# Patient Record
Sex: Female | Born: 1960 | Race: White | Hispanic: No | Marital: Married | Smoking: Never smoker
Health system: Southern US, Community
[De-identification: ages and names within clinical notes are randomized; demographics above are authoritative.]

## PROBLEM LIST (undated history)

## (undated) DIAGNOSIS — G4730 Sleep apnea, unspecified: Secondary | ICD-10-CM

## (undated) DIAGNOSIS — M7989 Other specified soft tissue disorders: Secondary | ICD-10-CM

## (undated) DIAGNOSIS — F410 Panic disorder [episodic paroxysmal anxiety] without agoraphobia: Secondary | ICD-10-CM

## (undated) DIAGNOSIS — E041 Nontoxic single thyroid nodule: Secondary | ICD-10-CM

## (undated) DIAGNOSIS — M2550 Pain in unspecified joint: Secondary | ICD-10-CM

## (undated) DIAGNOSIS — F32A Depression, unspecified: Secondary | ICD-10-CM

## (undated) DIAGNOSIS — E559 Vitamin D deficiency, unspecified: Secondary | ICD-10-CM

## (undated) DIAGNOSIS — F419 Anxiety disorder, unspecified: Secondary | ICD-10-CM

## (undated) DIAGNOSIS — R0602 Shortness of breath: Secondary | ICD-10-CM

## (undated) DIAGNOSIS — M549 Dorsalgia, unspecified: Secondary | ICD-10-CM

## (undated) DIAGNOSIS — J189 Pneumonia, unspecified organism: Secondary | ICD-10-CM

## (undated) DIAGNOSIS — K589 Irritable bowel syndrome without diarrhea: Secondary | ICD-10-CM

## (undated) DIAGNOSIS — R911 Solitary pulmonary nodule: Secondary | ICD-10-CM

## (undated) HISTORY — DX: Shortness of breath: R06.02

## (undated) HISTORY — DX: Panic disorder (episodic paroxysmal anxiety): F41.0

## (undated) HISTORY — DX: Vitamin D deficiency, unspecified: E55.9

## (undated) HISTORY — DX: Sleep apnea, unspecified: G47.30

## (undated) HISTORY — DX: Dorsalgia, unspecified: M54.9

## (undated) HISTORY — PX: OTHER SURGICAL HISTORY: SHX169

## (undated) HISTORY — DX: Solitary pulmonary nodule: R91.1

## (undated) HISTORY — DX: Depression, unspecified: F32.A

## (undated) HISTORY — DX: Anxiety disorder, unspecified: F41.9

## (undated) HISTORY — DX: Nontoxic single thyroid nodule: E04.1

## (undated) HISTORY — DX: Irritable bowel syndrome, unspecified: K58.9

## (undated) HISTORY — DX: Other specified soft tissue disorders: M79.89

## (undated) HISTORY — DX: Pain in unspecified joint: M25.50

---

## 1997-12-23 ENCOUNTER — Ambulatory Visit: Admission: RE | Admit: 1997-12-23 | Discharge: 1997-12-23 | Payer: Self-pay | Admitting: Pulmonary Disease

## 1998-10-26 ENCOUNTER — Other Ambulatory Visit: Admission: RE | Admit: 1998-10-26 | Discharge: 1998-10-26 | Payer: Self-pay | Admitting: *Deleted

## 1999-01-20 ENCOUNTER — Encounter: Admission: RE | Admit: 1999-01-20 | Discharge: 1999-01-20 | Payer: Self-pay | Admitting: *Deleted

## 1999-01-20 ENCOUNTER — Encounter: Payer: Self-pay | Admitting: Obstetrics and Gynecology

## 1999-03-18 ENCOUNTER — Encounter: Payer: Self-pay | Admitting: Family Medicine

## 1999-03-18 ENCOUNTER — Encounter: Admission: RE | Admit: 1999-03-18 | Discharge: 1999-03-18 | Payer: Self-pay | Admitting: Family Medicine

## 1999-10-04 ENCOUNTER — Emergency Department (HOSPITAL_COMMUNITY): Admission: EM | Admit: 1999-10-04 | Discharge: 1999-10-04 | Payer: Self-pay | Admitting: *Deleted

## 2003-03-04 ENCOUNTER — Other Ambulatory Visit: Admission: RE | Admit: 2003-03-04 | Discharge: 2003-03-04 | Payer: Self-pay | Admitting: Obstetrics and Gynecology

## 2004-03-30 ENCOUNTER — Emergency Department (HOSPITAL_COMMUNITY): Admission: EM | Admit: 2004-03-30 | Discharge: 2004-03-30 | Payer: Self-pay | Admitting: Emergency Medicine

## 2005-02-15 ENCOUNTER — Encounter: Admission: RE | Admit: 2005-02-15 | Discharge: 2005-02-15 | Payer: Self-pay | Admitting: Internal Medicine

## 2006-05-21 ENCOUNTER — Encounter: Admission: RE | Admit: 2006-05-21 | Discharge: 2006-05-21 | Payer: Self-pay | Admitting: Internal Medicine

## 2007-09-03 ENCOUNTER — Encounter: Admission: RE | Admit: 2007-09-03 | Discharge: 2007-09-03 | Payer: Self-pay | Admitting: Internal Medicine

## 2009-10-23 ENCOUNTER — Ambulatory Visit: Payer: Self-pay | Admitting: Emergency Medicine

## 2009-10-23 DIAGNOSIS — F411 Generalized anxiety disorder: Secondary | ICD-10-CM | POA: Insufficient documentation

## 2009-10-23 DIAGNOSIS — R05 Cough: Secondary | ICD-10-CM

## 2009-10-23 DIAGNOSIS — J069 Acute upper respiratory infection, unspecified: Secondary | ICD-10-CM | POA: Insufficient documentation

## 2010-04-19 NOTE — Assessment & Plan Note (Signed)
Summary: cough-dry, R ear pain, sorethroat x 1 wk rm 3   Vital Signs:  Patient Profile:   50 Years Old Female CC:      Cold & URI symptoms Height:     63 inches Weight:      268 pounds O2 Sat:      100 % O2 treatment:    Room Air Temp:     98.1 degrees F oral Pulse rate:   85 / minute Pulse rhythm:   regular Resp:     16 per minute BP sitting:   130 / 89  (right arm) Cuff size:   regular  Vitals Entered By: Areta Haber CMA (October 23, 2009 9:23 AM)                  Current Allergies: No known allergies History of Present Illness Chief Complaint: Cold & URI symptoms History of Present Illness: R ear pain x1 week, R-sided throat pain, coughing all day, URI symptoms + sore throat + cough No pleuritic pain No wheezing + nasal congestion + post-nasal drainage No sinus pain/pressure No itchy/red eyes + R earache No hemoptysis No SOB No chills/sweats No nausea No vomiting No abdominal pain No diarrhea No skin rashes No fatigue No myalgias No headache   Current Problems: COUGH (ICD-786.2) UPPER RESPIRATORY INFECTION (ICD-465.9) FAMILY HISTORY DIABETES 1ST DEGREE RELATIVE (ICD-V18.0) ANXIETY (ICD-300.00)   Current Meds ROBITUSSIN COUGH/COLD CF 5-10-100 MG/5ML LIQD (PHENYLEPHRINE-DM-GG) as directed TYLENOL COLD NO DROWSINESS 30-325-15 MG TABS (PSEUDOEPHEDRINE-APAP-DM) as directed ZOLOFT 100 MG TABS (SERTRALINE HCL) 1 1/2 tabs by mouth once daily ZITHROMAX Z-PAK 250 MG TABS (AZITHROMYCIN) use as directed CHERATUSSIN AC 100-10 MG/5ML SYRP (GUAIFENESIN-CODEINE) 5-10cc by mouth Q6 hours as needed cough  REVIEW OF SYSTEMS Constitutional Symptoms      Denies fever, chills, night sweats, weight loss, weight gain, and fatigue.  Eyes       Denies change in vision, eye pain, eye discharge, glasses, contact lenses, and eye surgery. Ear/Nose/Throat/Mouth       Complains of ear pain and sore throat.      Denies hearing loss/aids, change in hearing, ear discharge,  dizziness, frequent runny nose, frequent nose bleeds, sinus problems, hoarseness, and tooth pain or bleeding.      Comments: R x 1 wk Respiratory       Complains of dry cough.      Denies productive cough, wheezing, shortness of breath, asthma, bronchitis, and emphysema/COPD.  Cardiovascular       Denies murmurs, chest pain, and tires easily with exhertion.    Gastrointestinal       Denies stomach pain, nausea/vomiting, diarrhea, constipation, blood in bowel movements, and indigestion. Genitourniary       Denies painful urination, kidney stones, and loss of urinary control. Neurological       Denies paralysis, seizures, and fainting/blackouts. Musculoskeletal       Denies muscle pain, joint pain, joint stiffness, decreased range of motion, redness, swelling, muscle weakness, and gout.  Skin       Denies bruising, unusual mles/lumps or sores, and hair/skin or nail changes.  Psych       Denies mood changes, temper/anger issues, anxiety/stress, speech problems, depression, and sleep problems. Other Comments: Dry x 1 wk. P has not seen her PCP for this.   Past History:  Past Medical History: Anxiety  Past Surgical History: Caesarean section  Family History: Family History Diabetes 1st degree relative Family History Lung cancer  Social History: Single Never  Smoked Alcohol use-no Drug use-no Regular exercise-no Smoking Status:  never Drug Use:  no Does Patient Exercise:  no Physical Exam General appearance: well developed, well nourished, no acute distress Ears: normal, no lesions or deformities Nasal: clear discharge Oral/Pharynx: R tonsil larger,  Neck: R-sided tender ant cerv LAD Chest/Lungs: no rales, wheezes, or rhonchi bilateral, breath sounds equal without effort Heart: regular rate and  rhythm, no murmur Assessment New Problems: COUGH (ICD-786.2) UPPER RESPIRATORY INFECTION (ICD-465.9) FAMILY HISTORY DIABETES 1ST DEGREE RELATIVE (ICD-V18.0) ANXIETY  (ICD-300.00)   Patient Education: Patient and/or caregiver instructed in the following: rest, fluids, Tylenol prn.  Plan New Medications/Changes: CHERATUSSIN AC 100-10 MG/5ML SYRP (GUAIFENESIN-CODEINE) 5-10cc by mouth Q6 hours as needed cough  #150cc x 0, 10/23/2009, Hoyt Koch MD ZITHROMAX Z-PAK 250 MG TABS (AZITHROMYCIN) use as directed  #1 x 0, 10/23/2009, Hoyt Koch MD  New Orders: New Patient Level III [phone] Follow Up: Follow up with Primary Physician  The patient and/or caregiver has been counseled thoroughly with regard to medications prescribed including dosage, schedule, interactions, rationale for use, and possible side effects and they verbalize understanding.  Diagnoses and expected course of recovery discussed and will return if not improved as expected or if the condition worsens. Patient and/or caregiver verbalized understanding.  Prescriptions: CHERATUSSIN AC 100-10 MG/5ML SYRP (GUAIFENESIN-CODEINE) 5-10cc by mouth Q6 hours as needed cough  #150cc x 0   Entered and Authorized by:   Hoyt Koch MD   Signed by:   Hoyt Koch MD on 10/23/2009   Method used:   Printed then faxed to ...       [facility]  [address] [phone]* (retail)       [address] [place], [place]  [address]       Ph: [phone] or [phone]       Fax: [phone]   RxID:   [id number] ZITHROMAX Z-PAK 250 MG TABS (AZITHROMYCIN) use as directed  #1 x 0   Entered and Authorized by:   Hoyt Koch MD   Signed by:   Hoyt Koch MD on 10/23/2009   Method used:   Printed then faxed to ...       [facility]  [address] [phone]* (retail)       [address] [place], [place]  [address]       Ph: [phone] or [phone]       Fax: [phone]   RxID:   [id number]   Orders Added: 1)  New Patient Level III [phone]

## 2010-06-22 ENCOUNTER — Emergency Department (HOSPITAL_COMMUNITY)
Admission: EM | Admit: 2010-06-22 | Discharge: 2010-06-22 | Disposition: A | Discharge status: Home / Self Care | Payer: BC Managed Care – PPO | Attending: Emergency Medicine | Admitting: Emergency Medicine

## 2010-06-22 DIAGNOSIS — F411 Generalized anxiety disorder: Secondary | ICD-10-CM | POA: Insufficient documentation

## 2010-07-17 ENCOUNTER — Emergency Department (HOSPITAL_COMMUNITY)
Admission: EM | Admit: 2010-07-17 | Discharge: 2010-07-17 | Disposition: A | Discharge status: Home / Self Care | Payer: BC Managed Care – PPO | Attending: Emergency Medicine | Admitting: Emergency Medicine

## 2010-07-17 DIAGNOSIS — F410 Panic disorder [episodic paroxysmal anxiety] without agoraphobia: Secondary | ICD-10-CM | POA: Insufficient documentation

## 2010-08-15 ENCOUNTER — Emergency Department (HOSPITAL_COMMUNITY)
Admission: EM | Admit: 2010-08-15 | Discharge: 2010-08-15 | Disposition: A | Discharge status: Home / Self Care | Payer: BC Managed Care – PPO | Attending: Emergency Medicine | Admitting: Emergency Medicine

## 2010-08-15 ENCOUNTER — Emergency Department (HOSPITAL_COMMUNITY): Payer: BC Managed Care – PPO

## 2010-08-15 DIAGNOSIS — R112 Nausea with vomiting, unspecified: Secondary | ICD-10-CM | POA: Insufficient documentation

## 2010-08-15 DIAGNOSIS — R42 Dizziness and giddiness: Secondary | ICD-10-CM | POA: Insufficient documentation

## 2010-08-15 DIAGNOSIS — R0789 Other chest pain: Secondary | ICD-10-CM | POA: Insufficient documentation

## 2010-08-15 DIAGNOSIS — R197 Diarrhea, unspecified: Secondary | ICD-10-CM | POA: Insufficient documentation

## 2010-08-15 DIAGNOSIS — F410 Panic disorder [episodic paroxysmal anxiety] without agoraphobia: Secondary | ICD-10-CM | POA: Insufficient documentation

## 2010-08-15 DIAGNOSIS — K5289 Other specified noninfective gastroenteritis and colitis: Secondary | ICD-10-CM | POA: Insufficient documentation

## 2010-08-15 LAB — CBC
Hemoglobin: 13.6 g/dL (ref 12.0–15.0)
MCH: 30.1 pg (ref 26.0–34.0)
MCHC: 33.8 g/dL (ref 30.0–36.0)
MCV: 88.9 fL (ref 78.0–100.0)
RBC: 4.52 MIL/uL (ref 3.87–5.11)

## 2010-08-15 LAB — DIFFERENTIAL
Basophils Relative: 0 % (ref 0–1)
Eosinophils Absolute: 0.2 10*3/uL (ref 0.0–0.7)
Lymphs Abs: 2.3 10*3/uL (ref 0.7–4.0)
Monocytes Absolute: 0.7 10*3/uL (ref 0.1–1.0)
Monocytes Relative: 9 % (ref 3–12)
Neutro Abs: 4.6 10*3/uL (ref 1.7–7.7)
Neutrophils Relative %: 59 % (ref 43–77)

## 2010-08-15 LAB — BASIC METABOLIC PANEL
BUN: 16 mg/dL (ref 6–23)
CO2: 25 mEq/L (ref 19–32)
Calcium: 10 mg/dL (ref 8.4–10.5)
Chloride: 101 mEq/L (ref 96–112)
Creatinine, Ser: 0.65 mg/dL (ref 0.4–1.2)
GFR calc Af Amer: >60 mL/min (ref >60)

## 2011-03-21 DIAGNOSIS — E041 Nontoxic single thyroid nodule: Secondary | ICD-10-CM

## 2011-03-21 HISTORY — DX: Nontoxic single thyroid nodule: E04.1

## 2011-04-14 ENCOUNTER — Encounter: Payer: Self-pay | Admitting: Family Medicine

## 2011-04-14 ENCOUNTER — Ambulatory Visit (INDEPENDENT_AMBULATORY_CARE_PROVIDER_SITE_OTHER): Payer: BC Managed Care – PPO | Admitting: Family Medicine

## 2011-04-14 VITALS — BP 130/88 | HR 80 | Resp 16 | Ht 63.5 in | Wt 238.1 lb

## 2011-04-14 DIAGNOSIS — G4700 Insomnia, unspecified: Secondary | ICD-10-CM

## 2011-04-14 DIAGNOSIS — E669 Obesity, unspecified: Secondary | ICD-10-CM

## 2011-04-14 DIAGNOSIS — F411 Generalized anxiety disorder: Secondary | ICD-10-CM

## 2011-04-14 MED ORDER — PHENTERMINE HCL 30 MG PO CAPS: 30.0000 mg | ORAL_CAPSULE | ORAL | Status: AC

## 2011-04-14 MED ORDER — ZOLPIDEM TARTRATE 10 MG PO TABS: 10.0000 mg | ORAL_TABLET | Freq: Every evening | ORAL | Status: DC | PRN

## 2011-04-14 MED ORDER — SERTRALINE HCL 100 MG PO TABS: ORAL_TABLET | ORAL | Status: DC

## 2011-04-14 MED ORDER — CLONAZEPAM 1 MG PO TABS: 1.0000 mg | ORAL_TABLET | Freq: Every day | ORAL | Status: DC | PRN

## 2011-04-14 NOTE — Patient Instructions (Signed)
Restart your work-out program You can restart the phentermine at 1 whole tablet, if you have nausea/vomiting, diarrhea then go to 1/2 tablet first Continue your anxiety medications I will get records from Dr. Yehuda Budd office Welcome to the practice! F/U in 2 months for weight

## 2011-04-14 NOTE — Assessment & Plan Note (Addendum)
Chronic history of anxiety and panic attacks. Patient has been on SSRI for approximately 13 years as well as low-dose benzo. Medications refilled today. She was instructed to refill her been to in approximately 5 days when her previous prescription runs out. She's had a lot of stressors in her life. Most recently her mother has been placed in nursing home secondary to Alzheimer's disease and the family's inability to give her care for 24 hours a day. Also, she is coping with empty nest syndrome. At this time her SSRI will be continued

## 2011-04-14 NOTE — Progress Notes (Signed)
  Subjective:    Patient ID: Jenna Love, female    DOB: 05-12-1960, 51 y.o.   MRN: [id number]  HPI  Pt here to establish care , no specific concerns, medications and history reviewed  Anxiety- patient has a history of anxiety and panic attacks. This started approximately 13 years ago when her father died. She was initially started on Zoloft in when necessary Klonopin was added. She typically takes the Klonopin once a day. She uses Ambien at bedtime secondary to chronic insomnia. Her medications are prescribed by her primary care provider.  Obesity- patient was previously followed at the Bariat[facility] clinic however secondary to cost was unable to continue. Her last visit was approximately 2 months ago. She's lost 20 pounds. She has a Research scientist (physical sciences) at [employer] and will restart her exercise program. She was on phentermine 30 mg a like to continue if possible. She's been on phentermine for possibly 4 months.  PAP Smear- UTD Mammogram-UTD Labs done within past year by previous PCP +flu shot +tetanus 1 year ago   Has 2 children Works as Surveyor, minerals for [employer]    Review of Systems     GEN- denies fatigue, fever, weight loss,weakness, recent illness HEENT- denies eye drainage, change in vision, nasal discharge, CVS- denies chest pain, palpitations RESP- denies SOB, cough, wheeze ABD- denies N/V, change in stools, abd pain GU- denies dysuria, hematuria, dribbling, incontinence MSK- denies joint pain, muscle aches, injury Neuro- denies headache, dizziness, syncope, seizure activity       Objective:   Physical Exam GEN- NAD, alert and oriented x3, obese HEENT- PERRL, EOMI, non injected sclera, pink conjunctiva, MMM, oropharynx clear, TM clear bilat Neck- Supple, no thyromegaly CVS- RRR, no murmur RESP-CTAB ABD-NABS,soft, NT, ND EXT- No edema Pulses- Radial, DP- 2+ Psych- not depressed or anxious appearing,normal speech and mentation        Assessment  & Plan:

## 2011-04-14 NOTE — Assessment & Plan Note (Signed)
Ambien refill of chronic insomnia

## 2011-04-14 NOTE — Assessment & Plan Note (Signed)
Restart phentermine, patient to restart her exercise program with the [facility]. Followup in 2 months for weight loss. Will obtain labs from her previous physician to look at lipid panel

## 2011-04-25 ENCOUNTER — Encounter: Payer: Self-pay | Admitting: Family Medicine

## 2011-04-25 ENCOUNTER — Ambulatory Visit (INDEPENDENT_AMBULATORY_CARE_PROVIDER_SITE_OTHER): Payer: BC Managed Care – PPO | Admitting: Family Medicine

## 2011-04-25 VITALS — BP 118/78 | HR 109 | Temp 98.9°F | Resp 16 | Ht 63.5 in | Wt 240.0 lb

## 2011-04-25 DIAGNOSIS — R6889 Other general symptoms and signs: Secondary | ICD-10-CM

## 2011-04-25 DIAGNOSIS — J111 Influenza due to unidentified influenza virus with other respiratory manifestations: Secondary | ICD-10-CM

## 2011-04-25 MED ORDER — OSELTAMIVIR PHOSPHATE 75 MG PO CAPS: 75.0000 mg | ORAL_CAPSULE | Freq: Two times a day (BID) | ORAL | Status: AC

## 2011-04-25 NOTE — Patient Instructions (Addendum)
Take the tamiflu as prescribed, return if you do not improve in the next 2-3 days, drink plenty of fluids Influenza Facts Flu (influenza) is a contagious respiratory illness caused by the influenza viruses. It can cause mild to severe illness. While most healthy people recover from the flu without specific treatment and without complications, older people, young children, and people with certain health conditions are at higher risk for serious complications from the flu, including death. CAUSES    The flu virus is spread from person to person by respiratory droplets from coughing and sneezing.     A person can also become infected by touching an object or surface with a virus on it and then touching their mouth, eye or nose.     Adults may be able to infect others from 1 day before symptoms occur and up to 7 days after getting sick. So it is possible to give someone the flu even before you know you are sick and continue to infect others while you are sick.  SYMPTOMS    Fever (usually high).     Headache.    Tiredness (can be extreme).     Cough.    Sore throat.     Runny or stuffy nose.     Body aches.     Diarrhea and vomiting may also occur, particularly in children.     These symptoms are referred to as "flu-like symptoms". A lot of different illnesses, including the common cold, can have similar symptoms.  DIAGNOSIS    There are tests that can determine if you have the flu as long you are tested within the first 2 or 3 days of illness.     A doctor's exam and additional tests may be needed to identify if you have a disease that is a complicating the flu.  RISKS AND COMPLICATIONS   Some of the complications caused by the flu include:  Bacterial pneumonia or progressive pneumonia caused by the flu virus.     Loss of body fluids (dehydration).     Worsening of chronic medical conditions, such as heart failure, asthma, or diabetes.     Sinus problems and ear infections.    HOME CARE INSTRUCTIONS    Seek medical care early on.     If you are at high risk from complications of the flu, consult your health-care provider as soon as you develop flu-like symptoms. Those at high risk for complications include:     People 65 years or older.     People with chronic medical conditions, including diabetes.     Pregnant women.     Young children.     Your caregiver may recommend use of an antiviral medication to help treat the flu.     If you get the flu, get plenty of rest, drink a lot of liquids, and avoid using alcohol and tobacco.     You can take over-the-counter medications to relieve the symptoms of the flu if your caregiver approves. (Never give aspirin to children or teenagers who have flu-like symptoms, particularly fever).  PREVENTION   The single best way to prevent the flu is to get a flu vaccine each fall. Other measures that can help protect against the flu are:  Antiviral Medications     A number of antiviral drugs are approved for use in preventing the flu. These are prescription medications, and a doctor should be consulted before they are used.     Habits for Good Health  Cover your nose and mouth with a tissue when you cough or sneeze, throw the tissue away after you use it.     Wash your hands often with soap and water, especially after you cough or sneeze. If you are not near water, use an alcohol-based hand cleaner.     Avoid people who are sick.     If you get the flu, stay home from work or school. Avoid contact with other people so that you do not make them sick, too.     Try not to touch your eyes, nose, or mouth as germs ore often spread this way.  IN CHILDREN, EMERGENCY WARNING SIGNS THAT NEED URGENT MEDICAL ATTENTION:  Fast breathing or trouble breathing.     Bluish skin color.     Not drinking enough fluids.     Not waking up or not interacting.     Being so irritable that the child does not want to be held.      Flu-like symptoms improve but then return with fever and worse cough.     Fever with a rash.  IN ADULTS, EMERGENCY WARNING SIGNS THAT NEED URGENT MEDICAL ATTENTION:  Difficulty breathing or shortness of breath.     Pain or pressure in the chest or abdomen.     Sudden dizziness.     Confusion.    Severe or persistent vomiting.  SEEK IMMEDIATE MEDICAL CARE IF:   You or someone you know is experiencing any of the symptoms above. When you arrive at the emergency center,report that you think you have the flu. You may be asked to wear a mask and/or sit in a secluded area to protect others from getting sick. MAKE SURE YOU:    Understand these instructions.     Monitor your condition.     Seek medical care if you are getting worse, or not improving.  Document Released: 03/09/2003 Document Revised: 11/16/2010 Document Reviewed: 12/03/2008 [facility] Patient Information 2012 [place], [place].

## 2011-04-25 NOTE — Progress Notes (Signed)
  Subjective:    Patient ID: Jenna Love, female    DOB: 06/17/1960, 51 y.o.   MRN: [id number]  HPI  Body aches, subjective fever, +chills, sore throat x 3 days, +sick contact with boyfriend. History of H1N1 infection in the past, feels like she has the flu. Cough has minimal production. Has been using vapor rub to help with sinuses and chest. Felt tender on right side of neck on Sunday, this has improved  Review of Systems - per above  GEN- +atigue, +fever, weight loss,weakness, recent illness HEENT- denies eye drainage, change in vision, +nasal discharge, +sore throat CVS- denies chest pain, palpitations RESP- denies SOB, +cough, wheeze ABD- denies N/V, +change in stools, abd pain, +diarrhea, no blood in stool GU- denies dysuria, hematuria, dribbling, incontinence MSK- denies joint pain,+ muscle aches, injury Neuro- denies headache, dizziness, syncope, seizure activity    Objective:   Physical Exam GEN- NAD, alert and oriented x3, obese, ill appearing HEENT- PERRL, EOMI, non injected sclera, pink conjunctiva, MMM, oropharynx mild injection, TM clear bilat Neck- Supple, no LAD CVS- RRR, no murmur RESP-CTAB ABD-NABS,soft, NT, ND EXT- No edema MSK- Mild sorness with palpation of upper ext and lower ext Pulses- Radial, DP- 2+           Assessment & Plan:   Flu like illness- pt had early flu shot, however with flu like symptoms, history of H1N1 infection in the past. She is a non smoker. Will treat with Tamiflu, pt to continue Vapor rub as this has helped. Given note for work. F/U if no improvement

## 2011-05-22 ENCOUNTER — Telehealth: Payer: Self-pay | Admitting: Family Medicine

## 2011-05-22 ENCOUNTER — Other Ambulatory Visit: Payer: Self-pay

## 2011-05-23 ENCOUNTER — Other Ambulatory Visit: Payer: Self-pay

## 2011-05-23 MED ORDER — ZOLPIDEM TARTRATE 10 MG PO TABS: 10.0000 mg | ORAL_TABLET | Freq: Every evening | ORAL | Status: DC | PRN

## 2011-05-23 NOTE — Telephone Encounter (Signed)
Printed for Dr to sign  

## 2011-06-15 ENCOUNTER — Other Ambulatory Visit: Payer: Self-pay | Admitting: Family Medicine

## 2011-06-15 ENCOUNTER — Ambulatory Visit (INDEPENDENT_AMBULATORY_CARE_PROVIDER_SITE_OTHER): Payer: BC Managed Care – PPO | Admitting: Family Medicine

## 2011-06-15 ENCOUNTER — Encounter: Payer: Self-pay | Admitting: Family Medicine

## 2011-06-15 VITALS — BP 130/74 | HR 134 | Resp 18 | Wt 244.0 lb

## 2011-06-15 DIAGNOSIS — R002 Palpitations: Secondary | ICD-10-CM

## 2011-06-15 DIAGNOSIS — N920 Excessive and frequent menstruation with regular cycle: Secondary | ICD-10-CM | POA: Insufficient documentation

## 2011-06-15 DIAGNOSIS — E669 Obesity, unspecified: Secondary | ICD-10-CM

## 2011-06-15 LAB — LIPID PANEL
Cholesterol: 176 mg/dL (ref 0–200)
HDL: 48 mg/dL (ref >39)
LDL Cholesterol: 91 mg/dL (ref 0–99)
Triglycerides: 187 mg/dL — ABNORMAL HIGH (ref <150)

## 2011-06-15 NOTE — Progress Notes (Signed)
  Subjective:    Patient ID: Jenna Love, female    DOB: 1960-11-06, 51 y.o.   MRN: [id number]  HPI Heavy menstrual cycle-patient noted a very heavy menstrual cycle 2 weeks ago. She had bleeding for 8 days which is very heavy with clots she had to wear packing tampon. She has had heavier cycles the past few months. She had a normal Pap smear earlier this year with her previous PCP. She had abdominal cramping at the time as well as headache. She denies current vaginal bleeding or vaginal discharge or abdominal pain.  Palpitations- patient noted palpitations during her heavy cycle. She would have pain from her left shoulder down to her foot. The palpitations resided after her period stopped. She did take a few more for Klonopin to help with the anxiety thinking that the palpitations were from it. She denies current chest pain or any further episodes since then.   She has not been using phentermine but plans to start- she has gained 4 lbs since our last visit   Review of Systems   GEN- denies fatigue, fever, weight loss,weakness, recent illness HEENT- denies eye drainage, change in vision, nasal discharge, CVS- denies chest pain,+ palpitations RESP- denies SOB, cough, wheeze ABD- denies N/V, change in stools, abd pain GU- denies dysuria, hematuria, dribbling, incontinence Neuro- + headache, dizziness, syncope, seizure activity      Objective:   Physical Exam  GEN- NAD, alert and oriented x3 HEENT-MMM,EOMI,non icteric, pink conjunctiva Neck- Supple, no thyromegaly CVS- tachycardic, no murmur RESP-CTAB ABD-NABS,soft, NT,ND EXT- No edema Pulses- Radial, DP- 2+       Assessment & Plan:

## 2011-06-15 NOTE — Assessment & Plan Note (Addendum)
EKG- show Sinus tachycardia, no ST changes, compared to EKG from May 2012  Patient symptoms have resolved. This may have been secondary to her heavy period and feeling fatigued vs her anxiety over her heavy cycle. I offered her cardiology consult for monitoring versus watchful waiting. At this time we will wait and see if the episode occurs again if it does she will be referred to cardiology. I will check her metabolic panel CBC and TSH.

## 2011-06-15 NOTE — Assessment & Plan Note (Signed)
She has had heavier cycles recently. She did note that she missed a period in February. At this time she does not want to do any workup. I think it is okay to watch her next couple of cycles. She is near that perimenopausal time. If she has increased pain with her cycles would obtain an ultrasound.

## 2011-06-15 NOTE — Assessment & Plan Note (Signed)
Encouraged restarting exercise program, pt to try phentermine again

## 2011-06-15 NOTE — Patient Instructions (Signed)
If you have recurrent palpitations or chest pain please call and I will refer you to cardiology Monitor your cycles for the bleeding - if they get worse or longer we will get an ultrasound I recommend multivitamin daily Get the blood work done - we will call with results  F/U 4 months

## 2011-06-16 ENCOUNTER — Ambulatory Visit: Payer: BC Managed Care – PPO | Admitting: Family Medicine

## 2011-06-16 LAB — CBC
MCH: 28.9 pg (ref 26.0–34.0)
MCHC: 32.2 g/dL (ref 30.0–36.0)
MCV: 89.8 fL (ref 78.0–100.0)
Platelets: 295 10*3/uL (ref 150–400)
RDW: 13.8 % (ref 11.5–15.5)

## 2011-06-16 LAB — BASIC METABOLIC PANEL
Calcium: 9.3 mg/dL (ref 8.4–10.5)
Creat: 0.83 mg/dL (ref 0.50–1.10)
Sodium: 140 mEq/L (ref 135–145)

## 2011-06-16 LAB — TSH: TSH: 1.896 u[IU]/mL (ref 0.350–4.500)

## 2011-06-20 ENCOUNTER — Encounter: Payer: Self-pay | Admitting: Family Medicine

## 2011-06-28 ENCOUNTER — Ambulatory Visit (INDEPENDENT_AMBULATORY_CARE_PROVIDER_SITE_OTHER): Payer: BC Managed Care – PPO | Admitting: Family Medicine

## 2011-06-28 ENCOUNTER — Encounter: Payer: Self-pay | Admitting: Family Medicine

## 2011-06-28 VITALS — BP 112/76 | HR 102 | Temp 98.9°F | Resp 20 | Ht 63.5 in | Wt 241.0 lb

## 2011-06-28 DIAGNOSIS — F329 Major depressive disorder, single episode, unspecified: Secondary | ICD-10-CM

## 2011-06-28 DIAGNOSIS — B349 Viral infection, unspecified: Secondary | ICD-10-CM

## 2011-06-28 DIAGNOSIS — G4700 Insomnia, unspecified: Secondary | ICD-10-CM

## 2011-06-28 DIAGNOSIS — F3289 Other specified depressive episodes: Secondary | ICD-10-CM

## 2011-06-28 DIAGNOSIS — F32A Depression, unspecified: Secondary | ICD-10-CM

## 2011-06-28 DIAGNOSIS — R42 Dizziness and giddiness: Secondary | ICD-10-CM

## 2011-06-28 DIAGNOSIS — F411 Generalized anxiety disorder: Secondary | ICD-10-CM

## 2011-06-28 DIAGNOSIS — B9789 Other viral agents as the cause of diseases classified elsewhere: Secondary | ICD-10-CM

## 2011-06-28 MED ORDER — MECLIZINE HCL 25 MG PO TABS: 25.0000 mg | ORAL_TABLET | Freq: Three times a day (TID) | ORAL | Status: DC | PRN

## 2011-06-28 MED ORDER — CLONAZEPAM 1 MG PO TABS: 1.0000 mg | ORAL_TABLET | Freq: Three times a day (TID) | ORAL | Status: DC | PRN

## 2011-06-28 NOTE — Progress Notes (Signed)
  Subjective:    Patient ID: Jenna Love, female    DOB: Jul 31, 1960, 51 y.o.   MRN: [id number]  HPI  URI symptoms, with body aches x 2 days, no fever but feels ill. She's also had dizzy episodes for the past month period where she feels like things are spinning however this only lasts a few seconds. She does not know of anything that brings these on such as change in position. She has been stressed lately and has palpitations when she gets a lot of anxiety. Diarrhea x 1 day- 2 episodes no blood   Depression- the past month has been very difficult for her. She's having difficulty with her boyfriend states that he has been drinking more alcohol and been meaning to verbal way. She denies any physical assault. He currently lives with her. She's also upset after seeing her mother this week in a nursing home as her Alzheimer's has worsened. She denies suicidal ideation but think she needs counseling at she's been crying consistently. She continues to take her Zoloft has been on this for 13 years. She has been on Paxil and Effexor in the past which have not helped. She only takes the Klonopin once a day but it is wearing off.  Review of Systems   GEN- + fatigue, fever, weight loss,weakness, recent illness HEENT- denies eye drainage, change in vision, +nasal discharge, CVS- denies chest pain, palpitations RESP- denies SOB, cough, wheeze ABD- denies N/V, +change in stools, +abd pain GU- denies dysuria, hematuria, dribbling, incontinence MSK- denies joint pain, muscle aches, injury Neuro- denies headache, +dizziness, syncope, seizure activity      Objective:   Physical Exam GEN- NAD, alert and oriented x3 HEENT- PERRL, EOMI, non injected sclera, pink conjunctiva, MMM, oropharynx clear, Bilat TM small clear effusion, clear rhinorrhea , fundoscopic exam benign Neck- Supple, no burit CVS- RRR, no murmur RESP-CTAB ABD- NABS,soft, NT, ND EXT- No edema Pulses- Radial, DP- 2+ Neuro CNII-XII in  tact, no nystagmus, no focal deficits, neg rhomberg Psych- depressed, crying during exam, no apparent SI       Assessment & Plan:

## 2011-06-28 NOTE — Patient Instructions (Signed)
Continue the zoloft, increase your klonopin to three times a day  Use the meclizine for dizziness Call back and let me know about the counselor Try sudafed for the ear fluid and drainage  F/U 3 weeks

## 2011-06-29 DIAGNOSIS — F329 Major depressive disorder, single episode, unspecified: Secondary | ICD-10-CM | POA: Insufficient documentation

## 2011-06-29 DIAGNOSIS — R42 Dizziness and giddiness: Secondary | ICD-10-CM | POA: Insufficient documentation

## 2011-06-29 NOTE — Assessment & Plan Note (Signed)
She appears very overwhelmed today with her depression. I agree that she should go to counseling. This time she is not ready to leave her boyfriend. She has some support with her daughter however she is not very close with her siblings which also makes her upset especially after seeing her mother and her stay. At this time I do not think that we should change her SSRI in the middle of her current crises. I will increase her Klonopin to 3 times a day as needed. 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

## 2011-06-29 NOTE — Assessment & Plan Note (Signed)
Deteriorated Klonopin and increased

## 2011-06-29 NOTE — Assessment & Plan Note (Signed)
Continue Ambien. 

## 2011-06-29 NOTE — Assessment & Plan Note (Addendum)
supprotive care, push fluids, abdominal exam benign Sudafed for the fluid in ear

## 2011-06-29 NOTE — Assessment & Plan Note (Signed)
Her differentials for the dizziness are upper respiratory with her viral syndrome and she does have noted fluid in the ear she will try Sudafed for this. Others anxiety episodes.  No focal deficits on neuro exam Trial of meclizine

## 2011-06-30 ENCOUNTER — Ambulatory Visit (INDEPENDENT_AMBULATORY_CARE_PROVIDER_SITE_OTHER): Payer: BC Managed Care – PPO | Admitting: Family Medicine

## 2011-06-30 ENCOUNTER — Encounter: Payer: Self-pay | Admitting: Family Medicine

## 2011-06-30 VITALS — BP 122/76 | HR 112 | Temp 100.0°F | Resp 20 | Ht 63.5 in

## 2011-06-30 DIAGNOSIS — F329 Major depressive disorder, single episode, unspecified: Secondary | ICD-10-CM

## 2011-06-30 DIAGNOSIS — R42 Dizziness and giddiness: Secondary | ICD-10-CM

## 2011-06-30 DIAGNOSIS — F32A Depression, unspecified: Secondary | ICD-10-CM

## 2011-06-30 DIAGNOSIS — J019 Acute sinusitis, unspecified: Secondary | ICD-10-CM

## 2011-06-30 DIAGNOSIS — J329 Chronic sinusitis, unspecified: Secondary | ICD-10-CM

## 2011-06-30 DIAGNOSIS — F3289 Other specified depressive episodes: Secondary | ICD-10-CM

## 2011-06-30 MED ORDER — METHYLPREDNISOLONE ACETATE 40 MG/ML IJ SUSP
40.0000 mg | Freq: Once | INTRAMUSCULAR | Status: AC
  Administered 2011-06-30: 40 mg via INTRAMUSCULAR

## 2011-06-30 MED ORDER — AMOXICILLIN-POT CLAVULANATE 875-125 MG PO TABS: 1.0000 | ORAL_TABLET | Freq: Two times a day (BID) | ORAL | Status: DC

## 2011-06-30 MED ORDER — HYDROCODONE-ACETAMINOPHEN 5-500 MG PO TABS: 1.0000 | ORAL_TABLET | ORAL | Status: DC | PRN

## 2011-06-30 NOTE — Progress Notes (Signed)
  Subjective:    Patient ID: Jenna Love, female    DOB: [birth year], 51 y.o.   MRN: [id number]  HPI    Patient presents with facial pain, dizziness and pressure behind her right eye and neck. She states her symptoms got worse this morning. She did take the meclizine which has helped her dizzy episodes. She denies headache but feels worse than she previously did     Review of Systems GEN- + fatigue, fever, weight loss,weakness, recent illness HEENT- denies eye drainage, change in vision, +nasal discharge, CVS- denies chest pain, palpitations RESP- denies SOB, cough, wheeze ABD- denies N/V, +change in stools, +abd pain GU- denies dysuria, hematuria, dribbling, incontinence MSK- denies joint pain, muscle aches, injury Neuro- denies headache, +dizziness, syncope, seizure activity      Objective:   Physical Exam GEN- NAD, alert and oriented x3, low grade fever  HEENT- PERRL, EOMI, non injected sclera, pink conjunctiva, MMM, oropharynx clear, Bilat TM small clear effusion, clear rhinorrhea , +maxillary pressure fundoscopic exam benign Neck- Supple, TTP rigth side    CVS- RRR, no murmur RESP-CTAB ABD- NABS,soft, NT, ND EXT- No edema Pulses- Radial, DP- 2+ Neuro CNII-XII in tact, no nystagmus, no focal deficits, neg rhomberg Psych- depressed, crying during exam, no apparent SI         Assessment & Plan:

## 2011-06-30 NOTE — Assessment & Plan Note (Signed)
She has been okay past 2 days, has not been able to call her work about the counselor because she has felt bad Increasing the klonopin has helped her anxiety symptoms a lot

## 2011-06-30 NOTE — Assessment & Plan Note (Addendum)
We'll treat for acute sinusitis with worsening symptoms with antibiotics. Given depo-medrol for inflammation,  Vicodin for HA

## 2011-06-30 NOTE — Assessment & Plan Note (Signed)
Continue meclizine, treat for sinus infection if no improve, CT head, referral to neurology

## 2011-06-30 NOTE — Patient Instructions (Signed)
I am treating for sinus infection- take the antibiotics as prescribed You have been given a shot of steroids for inflammation and pain Continue the meclizine as needed  Vicodin for pain for the weekend Please call if the dizzy symptoms do not improve

## 2011-07-03 ENCOUNTER — Telehealth: Payer: Self-pay | Admitting: Family Medicine

## 2011-07-03 ENCOUNTER — Ambulatory Visit (HOSPITAL_COMMUNITY)
Admission: RE | Admit: 2011-07-03 | Discharge: 2011-07-03 | Disposition: A | Discharge status: Home / Self Care | Payer: BC Managed Care – PPO | Source: Ambulatory Visit | Attending: Family Medicine | Admitting: Family Medicine

## 2011-07-03 ENCOUNTER — Ambulatory Visit (INDEPENDENT_AMBULATORY_CARE_PROVIDER_SITE_OTHER): Payer: BC Managed Care – PPO | Admitting: Family Medicine

## 2011-07-03 ENCOUNTER — Encounter: Payer: Self-pay | Admitting: Family Medicine

## 2011-07-03 VITALS — BP 120/78 | HR 95 | Temp 98.2°F | Resp 18 | Ht 63.5 in | Wt 243.0 lb

## 2011-07-03 DIAGNOSIS — R0602 Shortness of breath: Secondary | ICD-10-CM

## 2011-07-03 DIAGNOSIS — J189 Pneumonia, unspecified organism: Secondary | ICD-10-CM

## 2011-07-03 DIAGNOSIS — R059 Cough, unspecified: Secondary | ICD-10-CM | POA: Insufficient documentation

## 2011-07-03 DIAGNOSIS — R05 Cough: Secondary | ICD-10-CM | POA: Insufficient documentation

## 2011-07-03 DIAGNOSIS — J329 Chronic sinusitis, unspecified: Secondary | ICD-10-CM

## 2011-07-03 DIAGNOSIS — R0989 Other specified symptoms and signs involving the circulatory and respiratory systems: Secondary | ICD-10-CM | POA: Insufficient documentation

## 2011-07-03 MED ORDER — ALBUTEROL SULFATE HFA 108 (90 BASE) MCG/ACT IN AERS: 2.0000 | INHALATION_SPRAY | RESPIRATORY_TRACT | Status: DC | PRN

## 2011-07-03 MED ORDER — AZITHROMYCIN 500 MG PO TABS: 500.0000 mg | ORAL_TABLET | Freq: Every day | ORAL | Status: AC

## 2011-07-03 NOTE — Patient Instructions (Signed)
I am treating Pneumonia, complete the augmetin and add the azithromycin Use the albuterol as needed for wheezing Out of work x 1 week - please have them fax me any forms that may be needed

## 2011-07-03 NOTE — Telephone Encounter (Signed)
I spoke with pt, she was feeling SOB over the weekend, and felt she was gasping for air like her asthma many years ago. Has an appt this afternoon Will send for CXR before appt

## 2011-07-03 NOTE — Progress Notes (Signed)
  Subjective:    Patient ID: Jenna Love, female    DOB: December 01, 1960, 51 y.o.   MRN: [id number]  HPI  SOB- patient treated last week for sinusitis and effusion of ear, Sat woke up with cough and SOB associated with wheeze, she staying in bed, she did try to go to ER but they were really full therefore decided to go home. Continues to have SOB with little exertion, CXR obtained shows retro-cardiac pneumonia. Ear and sinus pressure has improved. No recent dizzy spells  Pt has appt for therapist   Review of Systems    GEN- + fatigue,+ fever, weight loss,weakness, recent illness HEENT- denies eye drainage, change in vision, +nasal discharge, CVS- denies chest pain, palpitations RESP- + SOB, +cough, +wheeze ABD- +N/V, change in stools, abd pain GU- denies dysuria, hematuria, dribbling, incontinence MSK- denies joint pain, muscle aches, injury Neuro- denies headache, dizziness, syncope, seizure activity     Objective:   Physical Exam  EN- NAD, alert and oriented x3, afrebrile  HEENT- PERRL, EOMI, non injected sclera, pink conjunctiva, MMM, oropharynx clear, Bilat TM small clear effusion,  CVS- RRR, no murmur RESP-CTAB, upper airway congestion EXT- No edema Pulses- Radial, DP- 2+        Assessment & Plan:    Pneumonia- concern for atypical pneumonia will complete augmentin add azithromycin for atypical's. Albuterol prn wheeze   Recheck x-ray in 6 weeks if not improved Out of work x 1 week, worsening illness   Sinusitis- improved complete antibiotics  Dizziness- improved

## 2011-07-03 NOTE — Telephone Encounter (Signed)
Pt called on 4/13 c/o painful fast breathing, stated she hadjust been seen by PCP and was being treated for ear infection. I advised ED evaluation

## 2011-07-03 NOTE — Telephone Encounter (Signed)
Patient on the schedule to be seen today.

## 2011-07-05 ENCOUNTER — Telehealth: Payer: Self-pay | Admitting: Family Medicine

## 2011-07-05 NOTE — Telephone Encounter (Signed)
Please advise 

## 2011-07-06 ENCOUNTER — Telehealth: Payer: Self-pay | Admitting: Family Medicine

## 2011-07-06 MED ORDER — CHLORPHENIRAMINE-HYDROCODONE 8-10 MG/5ML PO LQCR: 5.0000 mL | Freq: Two times a day (BID) | ORAL | Status: DC | PRN

## 2011-07-06 NOTE — Telephone Encounter (Signed)
Pt aware that it was sent in

## 2011-07-06 NOTE — Telephone Encounter (Signed)
Done

## 2011-07-10 ENCOUNTER — Encounter: Payer: Self-pay | Admitting: Family Medicine

## 2011-07-10 ENCOUNTER — Ambulatory Visit (INDEPENDENT_AMBULATORY_CARE_PROVIDER_SITE_OTHER): Payer: BC Managed Care – PPO | Admitting: Family Medicine

## 2011-07-10 VITALS — BP 122/84 | HR 117 | Temp 97.4°F | Resp 20 | Ht 63.5 in | Wt 243.1 lb

## 2011-07-10 DIAGNOSIS — J019 Acute sinusitis, unspecified: Secondary | ICD-10-CM

## 2011-07-10 DIAGNOSIS — J189 Pneumonia, unspecified organism: Secondary | ICD-10-CM

## 2011-07-10 NOTE — Progress Notes (Signed)
  Subjective:    Patient ID: Jenna Love, female    DOB: 11/04/1960, 51 y.o.   MRN: [id number]  HPI   She continues to have fullness in her left ear. She is status post antibiotics for treatment of sinusitis and pneumonia. She took 10 days of Augmentin and 5 days of azithromycin. Denies any fever but continues to have some fatigue but this is improving. She's also eating and drinking well.   Review of Systems  GEN- + fatigue,denies fever, weight loss,weakness, recent illness HEENT- denies eye drainage, change in vision, +nasal congestion CVS- denies chest pain, palpitations RESP-  SOB, +cough, occ wheeze ABD- denies N/V, change in stools, abd pain GU- denies dysuria, hematuria, dribbling, incontinence MSK- denies joint pain, muscle aches, injury Neuro- denies headache, dizziness, syncope, seizure activity        Objective:   Physical Exam  GEN- NAD, alert and oriented x3, afrebrile , not ill appearing HEENT- PERRL, EOMI, non injected sclera, pink conjunctiva, MMM, oropharynx clear, Bilat TM small clear effusion,  CVS- Tachycardia, no murmur RESP-CTAB, upper airway congestion EXT- No edema Pulses- Radial, DP- 2+        Assessment & Plan:

## 2011-07-10 NOTE — Patient Instructions (Signed)
Vitamin C- once a day  Vitamin D- 800IU  Try the claritin samples  Continue albuterol as needed Saline spray/netty pot Repeat x-ray in 4 weeks

## 2011-07-11 NOTE — Assessment & Plan Note (Signed)
S/p antibiotics, clear residual effusion, no further antibiotics needed, she can start anti-histamine

## 2011-07-11 NOTE — Assessment & Plan Note (Addendum)
Improved, oxygen sats are good. Completed antibiotics Recheck CXR in 4 weeks Pt wanted to know which vitamins to boost immune system, see below

## 2011-07-18 ENCOUNTER — Ambulatory Visit: Payer: BC Managed Care – PPO | Admitting: Family Medicine

## 2011-07-19 ENCOUNTER — Ambulatory Visit: Payer: BC Managed Care – PPO | Admitting: Family Medicine

## 2011-07-19 ENCOUNTER — Ambulatory Visit (HOSPITAL_COMMUNITY)
Admission: RE | Admit: 2011-07-19 | Discharge: 2011-07-19 | Disposition: A | Discharge status: Home / Self Care | Payer: BC Managed Care – PPO | Source: Ambulatory Visit | Attending: Family Medicine | Admitting: Family Medicine

## 2011-07-19 ENCOUNTER — Telehealth: Payer: Self-pay | Admitting: Family Medicine

## 2011-07-19 DIAGNOSIS — R05 Cough: Secondary | ICD-10-CM | POA: Insufficient documentation

## 2011-07-19 DIAGNOSIS — J189 Pneumonia, unspecified organism: Secondary | ICD-10-CM

## 2011-07-19 DIAGNOSIS — M549 Dorsalgia, unspecified: Secondary | ICD-10-CM | POA: Insufficient documentation

## 2011-07-19 DIAGNOSIS — R059 Cough, unspecified: Secondary | ICD-10-CM | POA: Insufficient documentation

## 2011-07-19 DIAGNOSIS — R52 Pain, unspecified: Secondary | ICD-10-CM | POA: Insufficient documentation

## 2011-07-19 DIAGNOSIS — J9819 Other pulmonary collapse: Secondary | ICD-10-CM | POA: Insufficient documentation

## 2011-07-19 LAB — CBC WITH DIFFERENTIAL/PLATELET
Eosinophils Relative: 4 % (ref 0–5)
HCT: 40.9 % (ref 36.0–46.0)
Hemoglobin: 13.3 g/dL (ref 12.0–15.0)
Lymphocytes Relative: 32 % (ref 12–46)
Lymphs Abs: 2.4 10*3/uL (ref 0.7–4.0)
MCV: 86.8 fL (ref 78.0–100.0)
Monocytes Relative: 9 % (ref 3–12)
Platelets: 260 10*3/uL (ref 150–400)
RBC: 4.71 MIL/uL (ref 3.87–5.11)
WBC: 7.6 10*3/uL (ref 4.0–10.5)

## 2011-07-19 LAB — BASIC METABOLIC PANEL
CO2: 28 mEq/L (ref 19–32)
Chloride: 103 mEq/L (ref 96–112)
Creat: 0.73 mg/dL (ref 0.50–1.10)
Glucose, Bld: 106 mg/dL — ABNORMAL HIGH (ref 70–99)
Sodium: 140 mEq/L (ref 135–145)

## 2011-07-19 NOTE — Telephone Encounter (Signed)
Spoke with pt, having more back pain than before and still has some SOB. SHe has been working Will send for x-ray and labs, pt aware

## 2011-07-20 ENCOUNTER — Encounter: Payer: Self-pay | Admitting: Family Medicine

## 2011-07-20 ENCOUNTER — Ambulatory Visit (INDEPENDENT_AMBULATORY_CARE_PROVIDER_SITE_OTHER): Payer: BC Managed Care – PPO | Admitting: Family Medicine

## 2011-07-20 VITALS — BP 122/84 | HR 87 | Temp 98.2°F | Resp 16 | Ht 63.5 in | Wt 244.0 lb

## 2011-07-20 DIAGNOSIS — M7918 Myalgia, other site: Secondary | ICD-10-CM | POA: Insufficient documentation

## 2011-07-20 DIAGNOSIS — H6590 Unspecified nonsuppurative otitis media, unspecified ear: Secondary | ICD-10-CM

## 2011-07-20 DIAGNOSIS — F411 Generalized anxiety disorder: Secondary | ICD-10-CM

## 2011-07-20 DIAGNOSIS — M539 Dorsopathy, unspecified: Secondary | ICD-10-CM

## 2011-07-20 DIAGNOSIS — M62830 Muscle spasm of back: Secondary | ICD-10-CM

## 2011-07-20 DIAGNOSIS — F32A Depression, unspecified: Secondary | ICD-10-CM

## 2011-07-20 DIAGNOSIS — J189 Pneumonia, unspecified organism: Secondary | ICD-10-CM

## 2011-07-20 DIAGNOSIS — F329 Major depressive disorder, single episode, unspecified: Secondary | ICD-10-CM

## 2011-07-20 DIAGNOSIS — F3289 Other specified depressive episodes: Secondary | ICD-10-CM

## 2011-07-20 MED ORDER — CYCLOBENZAPRINE HCL 10 MG PO TABS: 10.0000 mg | ORAL_TABLET | Freq: Three times a day (TID) | ORAL | Status: DC | PRN

## 2011-07-20 MED ORDER — HYDROCOD POLST-CHLORPHEN POLST 10-8 MG/5ML PO LQCR: 5.0000 mL | Freq: Two times a day (BID) | ORAL | Status: DC | PRN

## 2011-07-20 NOTE — Assessment & Plan Note (Signed)
Infiltrate has resolved. She now has post cough however this is improving. Her exam is within normal limits. She does have some atelectasis and we discussed her getting up and walking around as well as taking deep breaths while at work

## 2011-07-20 NOTE — Patient Instructions (Signed)
Muscle relaxant for back Expect cough to improve in the next 2-3 weeks F/U 4 weeks  For recheck

## 2011-07-20 NOTE — Progress Notes (Signed)
  Subjective:    Patient ID: Jenna Love, female    DOB: 1961/02/02, 51 y.o.   MRN: [id number]  HPI Patient treated for pneumonia 2 weeks ago with Augmentin and azithromycin. She continues to have cough with clear production. For the past 3 days she's had some back spasms between the shoulder blades as well as the lower back. Repeat chest x-ray done yesterday morning which shows resolution of pneumonia with some mild atelectasis. CBC and BMET were unremarkable. 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  Review of Systems    GEN- + fatigue,denies fever, +chills, weight loss,weakness, recent illness HEENT- denies eye drainage, change in vision, +nasal congestion CVS- denies chest pain, palpitations RESP-  SOB, +cough, occ wheeze ABD- denies N/V, change in stools, abd pain GU- denies dysuria, hematuria, dribbling, incontinence MSK- denies joint pain, muscle aches, injury Neuro- denies headache, dizziness, syncope, seizure activity    Objective:   Physical Exam GEN- NAD, alert and oriented x3, afrebrile , non toxic appearing HEENT- PERRL, EOMI, non injected sclera, pink conjunctiva, MMM, oropharynx clear, Bilat TM small clear effusion,  CVS- RRR no murmur RESP-CTAB,  EXT- No edema Pulses- Radial, DP- 2+ Psych- not depressed or overly anxious appearing Back- spine NT, neg SLR, no spasm noted  Hip -normal IR/ER      Assessment & Plan:

## 2011-07-20 NOTE — Assessment & Plan Note (Signed)
Per above regarding meds, currently stable

## 2011-07-20 NOTE — Assessment & Plan Note (Signed)
Musculoskeletal pain secondary to prolonged cough. Trial of flexeril

## 2011-07-20 NOTE — Assessment & Plan Note (Signed)
She appears to be doing well with therapist. No change to meds

## 2011-07-20 NOTE — Assessment & Plan Note (Signed)
persistent effusion, s/p antibiotics, sudafed, now on claritin. If still present at recheck and pt bothered send to ENT

## 2011-08-10 ENCOUNTER — Other Ambulatory Visit: Payer: Self-pay

## 2011-08-10 MED ORDER — ZOLPIDEM TARTRATE 10 MG PO TABS: 10.0000 mg | ORAL_TABLET | Freq: Every evening | ORAL | Status: DC | PRN

## 2011-08-29 ENCOUNTER — Other Ambulatory Visit: Payer: Self-pay | Admitting: Family Medicine

## 2011-09-16 ENCOUNTER — Other Ambulatory Visit: Payer: Self-pay | Admitting: Family Medicine

## 2011-09-18 ENCOUNTER — Ambulatory Visit (INDEPENDENT_AMBULATORY_CARE_PROVIDER_SITE_OTHER): Payer: BC Managed Care – PPO | Admitting: Family Medicine

## 2011-09-18 ENCOUNTER — Encounter: Payer: Self-pay | Admitting: Family Medicine

## 2011-09-18 ENCOUNTER — Ambulatory Visit (HOSPITAL_COMMUNITY)
Admission: RE | Admit: 2011-09-18 | Discharge: 2011-09-18 | Disposition: A | Discharge status: Home / Self Care | Payer: BC Managed Care – PPO | Source: Ambulatory Visit | Attending: Family Medicine | Admitting: Family Medicine

## 2011-09-18 ENCOUNTER — Telehealth: Payer: Self-pay | Admitting: Family Medicine

## 2011-09-18 VITALS — BP 142/88 | HR 120 | Resp 18 | Ht 63.5 in | Wt 245.1 lb

## 2011-09-18 DIAGNOSIS — E669 Obesity, unspecified: Secondary | ICD-10-CM

## 2011-09-18 DIAGNOSIS — E041 Nontoxic single thyroid nodule: Secondary | ICD-10-CM

## 2011-09-18 DIAGNOSIS — R221 Localized swelling, mass and lump, neck: Secondary | ICD-10-CM

## 2011-09-18 DIAGNOSIS — H6590 Unspecified nonsuppurative otitis media, unspecified ear: Secondary | ICD-10-CM

## 2011-09-18 DIAGNOSIS — R599 Enlarged lymph nodes, unspecified: Secondary | ICD-10-CM | POA: Insufficient documentation

## 2011-09-18 DIAGNOSIS — F32A Depression, unspecified: Secondary | ICD-10-CM

## 2011-09-18 DIAGNOSIS — R220 Localized swelling, mass and lump, head: Secondary | ICD-10-CM

## 2011-09-18 DIAGNOSIS — F329 Major depressive disorder, single episode, unspecified: Secondary | ICD-10-CM

## 2011-09-18 DIAGNOSIS — G4700 Insomnia, unspecified: Secondary | ICD-10-CM

## 2011-09-18 DIAGNOSIS — N951 Menopausal and female climacteric states: Secondary | ICD-10-CM

## 2011-09-18 DIAGNOSIS — F411 Generalized anxiety disorder: Secondary | ICD-10-CM

## 2011-09-18 DIAGNOSIS — F3289 Other specified depressive episodes: Secondary | ICD-10-CM

## 2011-09-18 DIAGNOSIS — E049 Nontoxic goiter, unspecified: Secondary | ICD-10-CM | POA: Insufficient documentation

## 2011-09-18 NOTE — Assessment & Plan Note (Signed)
Pt likely perimenopausal prior to 3 months had irregular heavy menses. Will send to GYN, needs PAP Smear as well, she would like to discuss hormone therapy

## 2011-09-18 NOTE — Telephone Encounter (Signed)
Thyroid nodule found incidenctly > 1cm will send for ultrasound and uptake scan Discussed with pt

## 2011-09-18 NOTE — Patient Instructions (Signed)
I will refer you to GYN for PAP Smear and hormones- after 2pm ENT referral for the fluid in your ears  I will get an ultrasound of your neck  Use the klonopin at bedtime  Call me after a week if this does not help and medication- Restoril ( Temazepam) will be sent  F/U 3 months

## 2011-09-18 NOTE — Assessment & Plan Note (Addendum)
Her anxiety continues to be a problem for her. She's currently on Zoloft as well as Klonopin. She is no longer following with her therapist. I think some of the perimenopausal symptoms are also contributing at this time. Her relationship is okay however her significant other does drink alcohol and this tends to make her anxious and upset. She also worries a lot about her children father air currently doing well. We did discuss her wanting to come off of medications at some point. I think that this can be done however it would take a very slow taper. We both agreed that right now is not the time to try to come off medications. She denies physical or mental abuse by partner

## 2011-09-18 NOTE — Assessment & Plan Note (Signed)
Refer to ENT for persistent effusion associated with dizziness

## 2011-09-18 NOTE — Progress Notes (Signed)
  Subjective:    Patient ID: Jenna Love, female    DOB: 25-Jun-1960, 51 y.o.   MRN: [id number]  HPI  Patient presents for medication management and followup. She was seen last in May status post community-acquired pneumonia. At that time she was found to have chronic bilateral ear effusions. She continues to have a fullness in her ear and feels dizzy at times. She tried Sudafed, ear drops ,antihistamines without any relief. She has not slept well in the past 3 days. She decided to come off of her Ambien of note she also ran out of for klonopin 4 days ago. She was also taking a hormone  weight loss supplement that she found over the Internet which she discontinued around the same time. For the past few days she's had dizziness inability to sleep and headache. She thought it was from coming off of the Ambien. She has not slept in 2 days. She is also noted that her hot flashes are getting worse of the past few months. She has not had a menstrual cycle since March of 2013, she would like to discuss hormone therapy. No longer following with therapist at work   Review of Systems  - per above    GEN- + fatigue, fever, weight loss,weakness, recent illness HEENT- denies eye drainage, change in vision, nasal discharge, CVS- denies chest pain, palpitations RESP- denies SOB, cough, wheeze ABD- denies N/V, change in stools, abd pain GU- denies dysuria, hematuria, dribbling, incontinence MSK- denies joint pain, muscle aches, injury Neuro- denies headache,+ dizziness, syncope, seizure activity Psych- +panic attacks      Objective:   Physical Exam GEN- NAD, alert and oriented x3, obese , fatigued appearing  HEENT- PERRL, EOMI, non injected sclera, pink conjunctiva, MMM, oropharynx clear,Bilateral clear effusions Neck- Supple, + subtle swelling right jawline, +lymph node palpated  CVS- tachycardic HR 100, no murmur RESP-CTAB EXT- No edema Pulses- Radial, DP- 2+ Psych- normal affect and Mood         Assessment & Plan:

## 2011-09-18 NOTE — Assessment & Plan Note (Signed)
Continue zoloft,

## 2011-09-18 NOTE — Assessment & Plan Note (Signed)
She has not lost any weight. Advise her to stop the supplement per above. She's not appear very motivated encouraged exercise which also helps the mood

## 2011-09-18 NOTE — Assessment & Plan Note (Signed)
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 It is difficult to say whether her symptoms up overdue to running out Klonopin versus stopping the Ambien versus the new supplement that she balk off the Internet. At this time we will try her Klonopin at bedtime and she's been taking this for quite some time and it does make her sleepy. If this does not work the next choice would be temazepam

## 2011-09-18 NOTE — Assessment & Plan Note (Signed)
persistent right left swelling and tenderness, concern for abnormal lymph node, will send for ultrasound today

## 2011-09-19 ENCOUNTER — Telehealth: Payer: Self-pay | Admitting: Family Medicine

## 2011-09-25 ENCOUNTER — Encounter (HOSPITAL_COMMUNITY)
Admission: RE | Admit: 2011-09-25 | Discharge: 2011-09-25 | Disposition: A | Discharge status: Home / Self Care | Payer: BC Managed Care – PPO | Source: Ambulatory Visit | Attending: Family Medicine | Admitting: Family Medicine

## 2011-09-25 ENCOUNTER — Encounter (HOSPITAL_COMMUNITY): Payer: Self-pay

## 2011-09-25 DIAGNOSIS — E041 Nontoxic single thyroid nodule: Secondary | ICD-10-CM | POA: Insufficient documentation

## 2011-09-25 MED ORDER — SODIUM IODIDE I 131 CAPSULE
10.0000 | Freq: Once | INTRAVENOUS | Status: AC | PRN
  Administered 2011-09-25: 11 via ORAL

## 2011-09-26 ENCOUNTER — Telehealth: Payer: Self-pay | Admitting: Family Medicine

## 2011-09-26 ENCOUNTER — Encounter (HOSPITAL_COMMUNITY)
Admission: RE | Admit: 2011-09-26 | Discharge: 2011-09-26 | Disposition: A | Discharge status: Home / Self Care | Payer: BC Managed Care – PPO | Source: Ambulatory Visit | Attending: Family Medicine | Admitting: Family Medicine

## 2011-09-26 MED ORDER — SODIUM PERTECHNETATE TC 99M INJECTION
10.0000 | Freq: Once | INTRAVENOUS | Status: AC | PRN
  Administered 2011-09-26: 9.8 via INTRAVENOUS

## 2011-09-27 ENCOUNTER — Telehealth: Payer: Self-pay | Admitting: Family Medicine

## 2011-09-27 NOTE — Telephone Encounter (Signed)
Noted message sent to md to review results.

## 2011-09-27 NOTE — Telephone Encounter (Signed)
Pt given results  

## 2011-09-28 ENCOUNTER — Encounter: Payer: Self-pay | Admitting: Family Medicine

## 2011-09-29 ENCOUNTER — Telehealth: Payer: Self-pay | Admitting: Family Medicine

## 2011-09-29 NOTE — Telephone Encounter (Signed)
Will forward to Dr

## 2011-09-29 NOTE — Telephone Encounter (Signed)
I will refer her if the visit on Monday does not go well

## 2011-10-03 ENCOUNTER — Telehealth: Payer: Self-pay | Admitting: Family Medicine

## 2011-10-03 ENCOUNTER — Other Ambulatory Visit (INDEPENDENT_AMBULATORY_CARE_PROVIDER_SITE_OTHER): Payer: Self-pay | Admitting: Otolaryngology

## 2011-10-03 DIAGNOSIS — E041 Nontoxic single thyroid nodule: Secondary | ICD-10-CM

## 2011-10-03 NOTE — Telephone Encounter (Signed)
Note sent to dr. Suszanne Conners office.

## 2011-10-03 NOTE — Telephone Encounter (Signed)
Please get the note from Dr. Suszanne Conners, so that I can discuss with pt, Let her know when I get it I will discuss with her but right now I have no information on what he is considering or concerned about

## 2011-10-04 NOTE — Telephone Encounter (Signed)
Spoke with pt, she will now see Dr. Pollyann Kennedy

## 2011-10-05 ENCOUNTER — Ambulatory Visit (HOSPITAL_COMMUNITY)
Admission: RE | Admit: 2011-10-05 | Discharge: 2011-10-05 | Disposition: A | Discharge status: Home / Self Care | Payer: BC Managed Care – PPO | Source: Ambulatory Visit | Attending: Otolaryngology | Admitting: Otolaryngology

## 2011-10-05 ENCOUNTER — Other Ambulatory Visit (HOSPITAL_COMMUNITY): Payer: Self-pay | Admitting: Otolaryngology

## 2011-10-05 ENCOUNTER — Other Ambulatory Visit (INDEPENDENT_AMBULATORY_CARE_PROVIDER_SITE_OTHER): Payer: Self-pay | Admitting: Otolaryngology

## 2011-10-05 DIAGNOSIS — E041 Nontoxic single thyroid nodule: Secondary | ICD-10-CM

## 2011-10-05 NOTE — Telephone Encounter (Signed)
Noted  

## 2011-10-05 NOTE — OR Nursing (Signed)
1300 Prepped for biopsy by Dr. Tyron Russell. 1303  Chloraprep done, sterile towels applied to prepped site. 1306 xylocaine  2% numbing injection done by Dr. Tyron Russell 1307 biopsy #1 obtained 1308 biopsy # 2 obtained 1310 biopsy # 3 obtained 1312 another numbing injection to area Xylocaine 2% 1316 biopsy #4 obtained 1317 biopsy # 5 obtained 1319 biopsy # 6 obtained Pressure held to biopsy sites. bandaid applied. Tolerated procedure . Tolerated procedure well. Total 3.5cc xylocaine used to numb site. Vitals obtained

## 2011-10-05 NOTE — OR Nursing (Signed)
Discharge instructions given to pt.  Verbalized understanding. Pt escorted to home with radiology staff.

## 2011-10-05 NOTE — Procedures (Signed)
PreOperative Dx: RIGHT thyroid nodule x 2 Postoperative Dx: RIGHT thyroid nodule x 2 Procedure:   US guided FNA of RIGHT thyroid nodule x 2 Radiologist:  Tyron Russell Anesthesia:  2 ml of 2% lidocaine Specimen:  FNA of mid RIGHT thyroid nodule    FNA of inferior RIGHT thyroid nodule w/ microcalcifications EBL:   None Complications: None

## 2011-10-11 ENCOUNTER — Other Ambulatory Visit: Payer: Self-pay | Admitting: Otolaryngology

## 2011-10-12 ENCOUNTER — Telehealth: Payer: Self-pay | Admitting: Family Medicine

## 2011-10-12 NOTE — Telephone Encounter (Signed)
Pt was concerned about something found on an artery that Dr. Pollyann Kennedy found, i do not have these results scanned in yet, advised her to call his office to confirm what was found.

## 2011-10-19 NOTE — Telephone Encounter (Signed)
Patient is aware 

## 2011-10-20 ENCOUNTER — Ambulatory Visit (INDEPENDENT_AMBULATORY_CARE_PROVIDER_SITE_OTHER): Payer: BC Managed Care – PPO | Admitting: Family Medicine

## 2011-10-20 ENCOUNTER — Encounter: Payer: Self-pay | Admitting: Family Medicine

## 2011-10-20 VITALS — BP 128/80 | HR 100 | Resp 18 | Ht 63.5 in | Wt 253.0 lb

## 2011-10-20 DIAGNOSIS — R221 Localized swelling, mass and lump, neck: Secondary | ICD-10-CM

## 2011-10-20 DIAGNOSIS — R220 Localized swelling, mass and lump, head: Secondary | ICD-10-CM

## 2011-10-20 DIAGNOSIS — K111 Hypertrophy of salivary gland: Secondary | ICD-10-CM

## 2011-10-20 MED ORDER — DIAZEPAM 5 MG PO TABS: ORAL_TABLET | ORAL | Status: DC

## 2011-10-20 NOTE — Patient Instructions (Signed)
Take the Valium before the procedure if MRI is done ( late in afternoon) Continue all other meds  Keep previous f/u appt

## 2011-10-20 NOTE — Progress Notes (Signed)
  Subjective:    Patient ID: Jenna Love, female    DOB: 06/01/1960, 51 y.o.   MRN: [id number]  HPI  Pt here to f/u ENT biopsy, she still has neck pain and swelling on right side of neck that is concerning her. Possible right parotid mass noted by ENT, thyroid biopsy benign, ? carotidnyia- based on palpation of pain though no mass seen on ultrasound. 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    Review of Systems GEN- denies fatigue, fever, weight loss,weakness, recent illness HEENT- denies eye drainage, change in vision, nasal discharge, CVS- denies chest pain, palpitations RESP- denies SOB, cough, wheeze ABD- denies N/V, change in stools, abd pain GU- denies dysuria, hematuria, dribbling, incontinence MSK- denies joint pain, muscle aches, injury Neuro- denies headache, dizziness, syncope, seizure activity       Objective:   Physical Exam GEN- NAD, alert and oriented x3, obese , fatigued appearing  HEENT- PERRL, EOMI, non injected sclera, pink conjunctiva, MMM, oropharynx clear,Bilateral clear effusions Neck- Supple, + subtle swelling right jawline, +lymph node palpated         Assessment & Plan:

## 2011-10-21 ENCOUNTER — Encounter: Payer: Self-pay | Admitting: Family Medicine

## 2011-10-21 DIAGNOSIS — K111 Hypertrophy of salivary gland: Secondary | ICD-10-CM | POA: Insufficient documentation

## 2011-10-21 NOTE — Assessment & Plan Note (Signed)
She still has right sided neck swelling and pt very fearful of possible malignancy, noted enlarged parotid gland. After discussing with pt will obtain further imaging to assess the swelling and parotid gland, ENT did note possible neoplasm in right gland. Pt in agreement with this plan.

## 2011-10-21 NOTE — Assessment & Plan Note (Signed)
Further imaging to r/u neoplasm or stone

## 2011-10-26 ENCOUNTER — Ambulatory Visit (HOSPITAL_COMMUNITY)
Admission: RE | Admit: 2011-10-26 | Discharge: 2011-10-26 | Disposition: A | Discharge status: Home / Self Care | Payer: BC Managed Care – PPO | Source: Ambulatory Visit | Attending: Family Medicine | Admitting: Family Medicine

## 2011-10-26 DIAGNOSIS — K111 Hypertrophy of salivary gland: Secondary | ICD-10-CM | POA: Insufficient documentation

## 2011-10-26 DIAGNOSIS — R220 Localized swelling, mass and lump, head: Secondary | ICD-10-CM | POA: Insufficient documentation

## 2011-10-26 DIAGNOSIS — R221 Localized swelling, mass and lump, neck: Secondary | ICD-10-CM

## 2011-10-26 MED ORDER — IOHEXOL 300 MG/ML  SOLN
75.0000 mL | Freq: Once | INTRAMUSCULAR | Status: AC | PRN
  Administered 2011-10-26: 75 mL via INTRAVENOUS

## 2011-10-27 ENCOUNTER — Telehealth: Payer: Self-pay | Admitting: Family Medicine

## 2011-10-30 ENCOUNTER — Telehealth: Payer: Self-pay | Admitting: Family Medicine

## 2011-10-30 NOTE — Telephone Encounter (Signed)
Left message for pt to return call in AM  I spoke with Dr. Pollyann Kennedy, he is unsure of her continued pain with negative CT of neck. He advised looking at actual neck or dental route at this time

## 2011-10-30 NOTE — Telephone Encounter (Signed)
Left message for pt to call back. Already sent results of CT to Dr Pollyann Kennedy

## 2011-10-31 ENCOUNTER — Telehealth: Payer: Self-pay | Admitting: Family Medicine

## 2011-10-31 DIAGNOSIS — R7309 Other abnormal glucose: Secondary | ICD-10-CM

## 2011-10-31 DIAGNOSIS — R5383 Other fatigue: Secondary | ICD-10-CM

## 2011-10-31 DIAGNOSIS — E041 Nontoxic single thyroid nodule: Secondary | ICD-10-CM

## 2011-10-31 NOTE — Telephone Encounter (Signed)
Spoke with pt, we will hold on further imaging of neck at this time She is concerned more with increased fatigue, TSH was normal in March,benign biopsy Recheck TSH with T3, T4, check B12

## 2011-11-01 LAB — COMPREHENSIVE METABOLIC PANEL
AST: 16 U/L (ref 0–37)
BUN: 15 mg/dL (ref 6–23)
Calcium: 8.8 mg/dL (ref 8.4–10.5)
Chloride: 105 mEq/L (ref 96–112)
Creat: 0.76 mg/dL (ref 0.50–1.10)
Total Bilirubin: 0.5 mg/dL (ref 0.3–1.2)

## 2011-11-01 LAB — CBC
Hemoglobin: 13.5 g/dL (ref 12.0–15.0)
MCH: 28.9 pg (ref 26.0–34.0)
MCHC: 33.8 g/dL (ref 30.0–36.0)
Platelets: 278 10*3/uL (ref 150–400)
RDW: 14.1 % (ref 11.5–15.5)

## 2011-11-01 LAB — TSH: TSH: 2.383 u[IU]/mL (ref 0.350–4.500)

## 2011-11-01 LAB — HEMOGLOBIN A1C: Hgb A1c MFr Bld: 5.4 % (ref <5.7)

## 2011-11-01 LAB — VITAMIN B12: Vitamin B-12: 784 pg/mL (ref 211–911)

## 2011-11-01 LAB — T3, FREE: T3, Free: 2.7 pg/mL (ref 2.3–4.2)

## 2011-11-01 LAB — T4: T4, Total: 7 ug/dL (ref 5.0–12.5)

## 2011-11-01 NOTE — Telephone Encounter (Signed)
Dr Jeanice Lim already spoke with patient

## 2011-11-03 ENCOUNTER — Telehealth: Payer: Self-pay | Admitting: Family Medicine

## 2011-11-03 NOTE — Telephone Encounter (Signed)
Pt aware of lab results 

## 2011-11-05 ENCOUNTER — Other Ambulatory Visit: Payer: Self-pay | Admitting: Family Medicine

## 2011-11-16 ENCOUNTER — Other Ambulatory Visit (HOSPITAL_COMMUNITY)
Admission: RE | Admit: 2011-11-16 | Discharge: 2011-11-16 | Disposition: A | Discharge status: Home / Self Care | Payer: BC Managed Care – PPO | Source: Ambulatory Visit | Attending: Obstetrics & Gynecology | Admitting: Obstetrics & Gynecology

## 2011-11-16 ENCOUNTER — Other Ambulatory Visit: Payer: Self-pay | Admitting: Obstetrics & Gynecology

## 2011-11-16 DIAGNOSIS — Z01419 Encounter for gynecological examination (general) (routine) without abnormal findings: Secondary | ICD-10-CM | POA: Insufficient documentation

## 2011-12-24 ENCOUNTER — Other Ambulatory Visit: Payer: Self-pay | Admitting: Family Medicine

## 2011-12-31 ENCOUNTER — Other Ambulatory Visit: Payer: Self-pay | Admitting: Family Medicine

## 2012-01-09 ENCOUNTER — Ambulatory Visit (INDEPENDENT_AMBULATORY_CARE_PROVIDER_SITE_OTHER): Payer: BC Managed Care – PPO

## 2012-01-09 DIAGNOSIS — Z23 Encounter for immunization: Secondary | ICD-10-CM

## 2012-02-02 ENCOUNTER — Other Ambulatory Visit: Payer: Self-pay | Admitting: Family Medicine

## 2012-02-12 ENCOUNTER — Encounter: Payer: Self-pay | Admitting: Family Medicine

## 2012-02-12 ENCOUNTER — Ambulatory Visit (INDEPENDENT_AMBULATORY_CARE_PROVIDER_SITE_OTHER): Payer: BC Managed Care – PPO | Admitting: Family Medicine

## 2012-02-12 VITALS — BP 120/80 | HR 88 | Temp 99.4°F | Resp 15 | Ht 63.5 in | Wt 243.8 lb

## 2012-02-12 DIAGNOSIS — J069 Acute upper respiratory infection, unspecified: Secondary | ICD-10-CM

## 2012-02-12 DIAGNOSIS — J029 Acute pharyngitis, unspecified: Secondary | ICD-10-CM

## 2012-02-12 DIAGNOSIS — E669 Obesity, unspecified: Secondary | ICD-10-CM

## 2012-02-12 DIAGNOSIS — H109 Unspecified conjunctivitis: Secondary | ICD-10-CM

## 2012-02-12 MED ORDER — AMOXICILLIN-POT CLAVULANATE 875-125 MG PO TABS: 1.0000 | ORAL_TABLET | Freq: Two times a day (BID) | ORAL | Status: DC

## 2012-02-12 MED ORDER — AMOXICILLIN-POT CLAVULANATE 875-125 MG PO TABS: 1.0000 | ORAL_TABLET | Freq: Two times a day (BID) | ORAL | Status: AC

## 2012-02-12 MED ORDER — HYDROCOD POLST-CHLORPHEN POLST 10-8 MG/5ML PO LQCR: 5.0000 mL | Freq: Two times a day (BID) | ORAL | Status: DC | PRN

## 2012-02-12 MED ORDER — NEOMYCIN-POLYMYXIN-HC 3.5-10000-1 OP SUSP: 1.0000 [drp] | Freq: Four times a day (QID) | OPHTHALMIC | Status: DC

## 2012-02-12 NOTE — Assessment & Plan Note (Signed)
Cortisporin eye drops given

## 2012-02-12 NOTE — Patient Instructions (Signed)
Treating URI/sinus  Use eye drops, continue warm compresses to eyes Call if you do not improve

## 2012-02-12 NOTE — Assessment & Plan Note (Signed)
Recently joined [employer] doing well

## 2012-02-12 NOTE — Assessment & Plan Note (Signed)
With her history will cover for sinusitis with augmentin, cough meds given

## 2012-02-12 NOTE — Progress Notes (Signed)
  Subjective:    Patient ID: Jenna Love, female    DOB: Mar 13, 1961, 51 y.o.   MRN: [id number]  HPI Pt presents with cough, nasal drainage, eye drainage and redness for the past 5 days. History of pneumonia, +sick contact with grandson. Eyes draining white discharge throughout day, tried red eyes OTC drop with no help. Cough with Mild production   Review of Systems  GEN- + fatigue,+ fever, weight loss,weakness, recent illness HEENT- + eye drainage, change in vision, +nasal discharge, CVS- denies chest pain, palpitations RESP- denies SOB, +cough, wheeze ABD- denies N/V, change in stools, abd pain GU- denies dysuria, hematuria, dribbling, incontinence MSK- denies joint pain, muscle aches, injury, +bodyaches  Neuro- denies headache, dizziness, syncope, seizure activity      Objective:   Physical Exam  GEN- NAD, alert and oriented x3 HEENT- PERRL, EOMI, + injected sclera, + injected conjunctiva, MMM, oropharynx clear, TM clear bilat no effusion, + discharge bilateral eye no maxillary sinus tenderness,+Nasal drainage  Neck- Supple, no LAD CVS- RRR, no murmur RESP-CTAB EXT- No edema Pulses- Radial 2+    Strep Negative      Assessment & Plan:

## 2012-02-19 NOTE — Addendum Note (Signed)
Addended by: Abner Greenspan on: 02/19/2012 10:53 AM   Modules accepted: Orders

## 2012-03-01 ENCOUNTER — Encounter: Payer: Self-pay | Admitting: Family Medicine

## 2012-03-01 ENCOUNTER — Ambulatory Visit (INDEPENDENT_AMBULATORY_CARE_PROVIDER_SITE_OTHER): Payer: BC Managed Care – PPO | Admitting: Family Medicine

## 2012-03-01 VITALS — BP 120/84 | HR 99 | Temp 98.7°F | Resp 16 | Wt 243.0 lb

## 2012-03-01 DIAGNOSIS — J020 Streptococcal pharyngitis: Secondary | ICD-10-CM

## 2012-03-01 DIAGNOSIS — J029 Acute pharyngitis, unspecified: Secondary | ICD-10-CM

## 2012-03-01 MED ORDER — FLUCONAZOLE 150 MG PO TABS: 150.0000 mg | ORAL_TABLET | Freq: Once | ORAL | Status: DC

## 2012-03-01 MED ORDER — CEPHALEXIN 500 MG PO CAPS: 500.0000 mg | ORAL_CAPSULE | Freq: Two times a day (BID) | ORAL | Status: DC

## 2012-03-01 MED ORDER — FLUCONAZOLE 150 MG PO TABS: 150.0000 mg | ORAL_TABLET | Freq: Once | ORAL | Status: AC

## 2012-03-01 NOTE — Patient Instructions (Signed)
Antibiotics prescribed Vitamin C once a day  Fluids  Strep Throat Strep throat is an infection of the throat caused by a bacteria named Streptococcus pyogenes. Your caregiver may call the infection streptococcal "tonsillitis" or "pharyngitis" depending on whether there are signs of inflammation in the tonsils or back of the throat. Strep throat is most common in children from 41 to 49 years old during the cold months of the year, but it can occur in people of any age during any season. This infection is spread from person to person (contagious) through coughing, sneezing, or other close contact. SYMPTOMS    Fever or chills.   Painful, swollen, red tonsils or throat.   Pain or difficulty when swallowing.   White or yellow spots on the tonsils or throat.   Swollen, tender lymph nodes or "glands" of the neck or under the jaw.   Red rash all over the body (rare).  DIAGNOSIS   Many different infections can cause the same symptoms. A test must be done to confirm the diagnosis so the right treatment can be given. A "rapid strep test" can help your caregiver make the diagnosis in a few minutes. If this test is not available, a light swab of the infected area can be used for a throat culture test. If a throat culture test is done, results are usually available in a day or two. TREATMENT   Strep throat is treated with antibiotic medicine. HOME CARE INSTRUCTIONS    Gargle with 1 tsp of salt in 1 cup of warm water, 3 to 4 times per day or as needed for comfort.   Family members who also have a sore throat or fever should be tested for strep throat and treated with antibiotics if they have the strep infection.   Make sure everyone in your household washes their hands well.   Do not share food, drinking cups, or personal items that could cause the infection to spread to others.   You may need to eat a soft food diet until your sore throat gets better.   Drink enough water and fluids to keep your  urine clear or pale yellow. This will help prevent dehydration.   Get plenty of rest.   Stay home from school, daycare, or work until you have been on antibiotics for 24 hours.   Only take over-the-counter or prescription medicines for pain, discomfort, or fever as directed by your caregiver.   If antibiotics are prescribed, take them as directed. Finish them even if you start to feel better.  SEEK MEDICAL CARE IF:    The glands in your neck continue to enlarge.   You develop a rash, cough, or earache.   You cough up green, yellow-brown, or bloody sputum.   You have pain or discomfort not controlled by medicines.   Your problems seem to be getting worse rather than better.  SEEK IMMEDIATE MEDICAL CARE IF:    You develop any new symptoms such as vomiting, severe headache, stiff or painful neck, chest pain, shortness of breath, or trouble swallowing.   You develop severe throat pain, drooling, or changes in your voice.   You develop swelling of the neck, or the skin on the neck becomes red and tender.   You have a fever.   You develop signs of dehydration, such as fatigue, dry mouth, and decreased urination.   You become increasingly sleepy, or you cannot wake up completely.  Document Released: 03/03/2000 Document Revised: 05/29/2011 Document Reviewed: 05/05/2010 [employer]  Patient Information 2013 [place], [place].

## 2012-03-01 NOTE — Assessment & Plan Note (Signed)
Second illness will switch to cephalexin twice a day for 10 days. Advised ibuprofen salt water gargles she's also using honey. If her pain is intolerable I will consider viscous lidocaine.

## 2012-03-01 NOTE — Progress Notes (Signed)
  Subjective:    Patient ID: Jenna Love, female    DOB: [birth year], 51 y.o.   MRN: [id number]  HPI Patient presents with sore throat for the past 2 weeks. She was treated on November 25 for upper respiratory infection after positive sick contact. She states that she improved but did not get completely well. She completed course of antibiotics. Her throat feels like it is on fire like she has sores in her mouth. She has painful swallowing. Denies any fever or shortness of breath. Has minimal cough.   Review of Systems - per above  GEN- denies fatigue, fever, weight loss,weakness, recent illness HEENT- denies eye drainage, change in vision, nasal discharge, CVS- denies chest pain, palpitations RESP- denies SOB,+ cough, wheeze Neuro- denies headache, dizziness, syncope, seizure activity        Objective:   Physical Exam  GEN- NAD, alert and oriented x3 HEENT- PERRL, EOMI, non injected sclera, pink conjunctiva, MMM, oropharynx + injection, no exudates seen, tonsilar swelling, TM clear bilat no effusion, nares clear  Neck- Supple, shotty LAD CVS- RRR, no murmur RESP-CTAB Pulse 2+ radial         Assessment & Plan:

## 2012-03-04 ENCOUNTER — Other Ambulatory Visit: Payer: Self-pay | Admitting: Family Medicine

## 2012-03-05 ENCOUNTER — Ambulatory Visit (HOSPITAL_COMMUNITY)
Admission: RE | Admit: 2012-03-05 | Discharge: 2012-03-05 | Disposition: A | Discharge status: Home / Self Care | Payer: BC Managed Care – PPO | Source: Ambulatory Visit | Attending: Family Medicine | Admitting: Family Medicine

## 2012-03-05 ENCOUNTER — Telehealth: Payer: Self-pay | Admitting: Family Medicine

## 2012-03-05 DIAGNOSIS — R0602 Shortness of breath: Secondary | ICD-10-CM | POA: Insufficient documentation

## 2012-03-05 NOTE — Telephone Encounter (Signed)
Patient aware.

## 2012-03-05 NOTE — Telephone Encounter (Signed)
Patient states she is having shortness of breath again like she did when she had pneumonia and also having pains between her shoulder blades. I advised ED but she didn't want to go. Wants to see if you would order a chest xray today

## 2012-03-05 NOTE — Telephone Encounter (Signed)
Pt can get CXR

## 2012-03-06 ENCOUNTER — Telehealth: Payer: Self-pay | Admitting: Family Medicine

## 2012-03-06 NOTE — Telephone Encounter (Signed)
Patient aware of results.

## 2012-03-06 NOTE — Telephone Encounter (Signed)
Patient aware.

## 2012-03-10 ENCOUNTER — Other Ambulatory Visit: Payer: Self-pay | Admitting: Family Medicine

## 2012-03-18 ENCOUNTER — Telehealth: Payer: Self-pay | Admitting: Family Medicine

## 2012-03-18 NOTE — Telephone Encounter (Signed)
Continue drops x 1 week , warm compresses to eye, call if you do not improve

## 2012-03-18 NOTE — Telephone Encounter (Signed)
Patient aware.

## 2012-03-18 NOTE — Telephone Encounter (Signed)
Spoke with patient and she states that she has redness and drainage from right eye.  She has started to use drops that she had left over from last episode.  Would like to know if she should do anything different from this.

## 2012-03-22 ENCOUNTER — Other Ambulatory Visit: Payer: Self-pay | Admitting: Family Medicine

## 2012-05-15 ENCOUNTER — Other Ambulatory Visit: Payer: Self-pay | Admitting: Family Medicine

## 2012-05-26 ENCOUNTER — Other Ambulatory Visit: Payer: Self-pay | Admitting: Family Medicine

## 2012-05-31 ENCOUNTER — Emergency Department (HOSPITAL_COMMUNITY): Payer: PRIVATE HEALTH INSURANCE

## 2012-05-31 ENCOUNTER — Emergency Department (HOSPITAL_COMMUNITY)
Admission: EM | Admit: 2012-05-31 | Discharge: 2012-05-31 | Disposition: A | Discharge status: Home / Self Care | Payer: PRIVATE HEALTH INSURANCE | Attending: Emergency Medicine | Admitting: Emergency Medicine

## 2012-05-31 ENCOUNTER — Encounter (HOSPITAL_COMMUNITY): Payer: Self-pay

## 2012-05-31 DIAGNOSIS — Z862 Personal history of diseases of the blood and blood-forming organs and certain disorders involving the immune mechanism: Secondary | ICD-10-CM | POA: Insufficient documentation

## 2012-05-31 DIAGNOSIS — F410 Panic disorder [episodic paroxysmal anxiety] without agoraphobia: Secondary | ICD-10-CM | POA: Insufficient documentation

## 2012-05-31 DIAGNOSIS — M25519 Pain in unspecified shoulder: Secondary | ICD-10-CM | POA: Insufficient documentation

## 2012-05-31 DIAGNOSIS — Z8639 Personal history of other endocrine, nutritional and metabolic disease: Secondary | ICD-10-CM | POA: Insufficient documentation

## 2012-05-31 DIAGNOSIS — Z8701 Personal history of pneumonia (recurrent): Secondary | ICD-10-CM | POA: Insufficient documentation

## 2012-05-31 DIAGNOSIS — M25511 Pain in right shoulder: Secondary | ICD-10-CM

## 2012-05-31 DIAGNOSIS — IMO0001 Reserved for inherently not codable concepts without codable children: Secondary | ICD-10-CM | POA: Insufficient documentation

## 2012-05-31 DIAGNOSIS — R52 Pain, unspecified: Secondary | ICD-10-CM | POA: Insufficient documentation

## 2012-05-31 DIAGNOSIS — Z79899 Other long term (current) drug therapy: Secondary | ICD-10-CM | POA: Insufficient documentation

## 2012-05-31 DIAGNOSIS — R0602 Shortness of breath: Secondary | ICD-10-CM | POA: Insufficient documentation

## 2012-05-31 HISTORY — DX: Pneumonia, unspecified organism: J18.9

## 2012-05-31 LAB — CBC WITH DIFFERENTIAL/PLATELET
Basophils Absolute: 0 10*3/uL (ref 0.0–0.1)
Basophils Relative: 0 % (ref 0–1)
Eosinophils Absolute: 0.3 10*3/uL (ref 0.0–0.7)
Hemoglobin: 15.1 g/dL — ABNORMAL HIGH (ref 12.0–15.0)
MCH: 30 pg (ref 26.0–34.0)
MCHC: 35.2 g/dL (ref 30.0–36.0)
Monocytes Relative: 6 % (ref 3–12)
Neutro Abs: 2.9 10*3/uL (ref 1.7–7.7)
Neutrophils Relative %: 43 % (ref 43–77)
Platelets: 251 10*3/uL (ref 150–400)
RDW: 12.8 % (ref 11.5–15.5)

## 2012-05-31 LAB — BASIC METABOLIC PANEL
BUN: 19 mg/dL (ref 6–23)
Chloride: 102 mEq/L (ref 96–112)
GFR calc Af Amer: >90 mL/min (ref >90)
GFR calc non Af Amer: >90 mL/min (ref >90)
Potassium: 3.5 mEq/L (ref 3.5–5.1)
Sodium: 138 mEq/L (ref 135–145)

## 2012-05-31 LAB — TROPONIN I: Troponin I: <0.3 ng/mL (ref <0.30)

## 2012-05-31 MED ORDER — KETOROLAC TROMETHAMINE 10 MG PO TABS
10.0000 mg | ORAL_TABLET | Freq: Once | ORAL | Status: AC
  Administered 2012-05-31: 10 mg via ORAL
  Filled 2012-05-31: qty 1

## 2012-05-31 MED ORDER — PROMETHAZINE HCL 25 MG PO TABS: 25.0000 mg | ORAL_TABLET | Freq: Four times a day (QID) | ORAL | Status: DC | PRN

## 2012-05-31 MED ORDER — MELOXICAM 7.5 MG PO TABS: ORAL_TABLET | ORAL | Status: DC

## 2012-05-31 MED ORDER — ONDANSETRON HCL 4 MG PO TABS
4.0000 mg | ORAL_TABLET | Freq: Once | ORAL | Status: AC
  Administered 2012-05-31: 4 mg via ORAL
  Filled 2012-05-31: qty 1

## 2012-05-31 MED ORDER — HYDROCODONE-ACETAMINOPHEN 5-325 MG PO TABS: 1.0000 | ORAL_TABLET | ORAL | Status: DC | PRN

## 2012-05-31 MED ORDER — HYDROCODONE-ACETAMINOPHEN 5-325 MG PO TABS
2.0000 | ORAL_TABLET | Freq: Once | ORAL | Status: AC
  Administered 2012-05-31: 2 via ORAL
  Filled 2012-05-31: qty 2

## 2012-05-31 NOTE — ED Provider Notes (Signed)
History     CSN: [id number]  Arrival date & time 05/31/12  1018   None     Chief Complaint  Patient presents with  . Chest Pain    (Consider location/radiation/quality/duration/timing/severity/associated sxs/prior treatment) Patient is a 52 y.o. female presenting with chest pain. The history is provided by the patient.  Chest Pain Pain location:  R chest (right arm) Pain quality: throbbing   Pain radiates to:  R shoulder Pain radiates to the back: no   Pain severity:  Moderate Onset quality:  Sudden Duration:  2 hours Timing:  Intermittent Progression:  Worsening Chronicity:  New Context: not breathing, no movement, not raising an arm and no trauma   Relieved by:  Nothing Worsened by:  Nothing tried Ineffective treatments:  None tried Associated symptoms: shortness of breath   Associated symptoms: no abdominal pain, no back pain, no cough, no dizziness, no fever, no nausea, no near-syncope and no palpitations   Risk factors: no coronary artery disease, no diabetes mellitus, no high cholesterol, no hypertension, no smoking and no surgery     Past Medical History  Diagnosis Date  . Anxiety   . Panic attacks   . Thyroid nodule 2013    Benign biopsy- ENT Dr. Pollyann Kennedy  . Pneumonia     Past Surgical History  Procedure Laterality Date  . Cesarean section  1992    Family History  Problem Relation Age of Onset  . Diabetes Mother   . Alzheimer's disease Mother   . Cancer Father     lung     History  Substance Use Topics  . Smoking status: Never Smoker   . Smokeless tobacco: Not on file  . Alcohol Use: No    OB History   Grav Para Term Preterm Abortions TAB SAB Ect Mult Living                  Review of Systems  Constitutional: Negative for fever and activity change.       All ROS Neg except as noted in HPI  HENT: Positive for sneezing. Negative for nosebleeds and neck pain.   Eyes: Negative for photophobia and discharge.  Respiratory: Positive for  shortness of breath. Negative for cough and wheezing.   Cardiovascular: Positive for chest pain. Negative for palpitations and near-syncope.  Gastrointestinal: Negative for nausea, abdominal pain and blood in stool.  Genitourinary: Negative for dysuria, frequency and hematuria.  Musculoskeletal: Positive for myalgias and arthralgias. Negative for back pain.  Skin: Negative.   Neurological: Negative for dizziness, seizures and speech difficulty.  Psychiatric/Behavioral: Negative for hallucinations and confusion.    Allergies  Review of patient's allergies indicates no known allergies.  Home Medications   Current Outpatient Rx  Name  Route  Sig  Dispense  Refill  . clonazePAM (KLONOPIN) 1 MG tablet      TAKE 1 TABLET BY MOUTH THREE TIMES DAILY AS NEEDED   60 tablet   1   . megestrol (MEGACE) 40 MG tablet   Oral   Take 40 mg by mouth daily.         . sertraline (ZOLOFT) 100 MG tablet      TAKE 1 &  1/2 TABLET BY MOUTH EVERY DAY   45 tablet   4   . zolpidem (AMBIEN) 10 MG tablet      TAKE 1 TABLET BY MOUTH EVERY NIGHT AT BEDTIME AS NEEDED   30 tablet   3     BP 129/70  Pulse 85  Temp(Src) 97.7 F (36.5 C) (Oral)  Resp 23  Ht 5\' 3"  (1.6 m)  Wt 245 lb (111.131 kg)  BMI 43.41 kg/m2  SpO2 97%  Physical Exam  Nursing note and vitals reviewed. Constitutional: She is oriented to person, place, and time. She appears well-developed and well-nourished.  Non-toxic appearance.  HENT:  Head: Normocephalic.  Right Ear: Tympanic membrane and external ear normal.  Left Ear: Tympanic membrane and external ear normal.  Eyes: EOM and lids are normal. Pupils are equal, round, and reactive to light.  Neck: Normal range of motion. Neck supple. Carotid bruit is not present.  Mild soreness with range of motion of the neck. There is no cervical lymphadenopathy. There is soreness that extends to the right shoulder range of motion. No carotid bruit appreciated.  Cardiovascular: Normal  rate, regular rhythm, normal heart sounds, intact distal pulses and normal pulses.   Pulmonary/Chest: Breath sounds normal. No respiratory distress.  No sternal area tenderness. There is symmetrical rise and fall of the chest.  Abdominal: Soft. Bowel sounds are normal. There is no tenderness. There is no guarding.  Musculoskeletal: Normal range of motion.  There is good range of motion of both right and left shoulders. There is soreness with range of motion of the right shoulder. The brachial and radial pulses are 2+. There is no deformity of the right shoulder, forearm, or wrist,. The capillary refill of the fingers is less than 3 seconds.  There is no swelling or redness of the lower extremities. There is no calf area tenderness. The dorsalis pedis pulses 2+ and symmetrical. There is no edema.  Lymphadenopathy:       Head (right side): No submandibular adenopathy present.       Head (left side): No submandibular adenopathy present.    She has no cervical adenopathy.  Neurological: She is alert and oriented to person, place, and time. She has normal strength. No cranial nerve deficit or sensory deficit.  There no motor or sensory deficits appreciated. Grip is symmetrical.  Skin: Skin is warm and dry.  Psychiatric: She has a normal mood and affect. Her speech is normal.    ED Course  Procedures (including critical care time)  Labs Reviewed  CBC WITH DIFFERENTIAL - Abnormal; Notable for the following:    Hemoglobin 15.1 (*)    All other components within normal limits  BASIC METABOLIC PANEL - Abnormal; Notable for the following:    Glucose, Bld 142 (*)    All other components within normal limits  TROPONIN I   Dg Chest 2 View  05/31/2012  *RADIOLOGY REPORT*  Clinical Data: Chest pain.  CHEST - 2 VIEW  Comparison: PA and lateral chest 03/05/2012.  Findings: Lungs are clear.  Heart size is normal.  No pneumothorax or pleural effusion.  Thoracic degenerative change is noted.  IMPRESSION:  No acute abnormality.   Original Report Authenticated By: Holley Dexter, M.D.    Date: 05/31/2012  Rate:96  Rhythm: normal sinus rhythm  QRS Axis: normal  Intervals: normal  ST/T Wave abnormalities: Non-specific T wave changes.  Conduction Disutrbances:none  Narrative Interpretation:   Old EKG Reviewed: unchanged fromMay 28, 2012.    No diagnosis found.    MDM  I have reviewed nursing notes, vital signs, and all appropriate lab and imaging results for this patient.   Patient presents to the emergency department with complaint of pain in the right arm, right shoulder, and right upper portion of the chest. It is  of note the patient states she has no" ED" most of the week. She states that one of the day she had some mild congestion and some mild nausea but otherwise she has been okay. She has not had any loss of consciousness or other related problems. There is no history of coronary artery disease. Patient is  PERC negative.    The basic metabolic panel is well within normal limits, with exception of the glucose being 142. The troponin is normal at 0.30. The complete blood count is well within normal limits. The chest x-ray shows no acute abnormality. The electrocardiogram shows a normal sinus rhythm at 96 beats per minute. There no lethal or abnormal arrhythmias.  Suspect the patient has a musculoskeletal on her shoulder or neck pain. Patient will be treated with Mobic 7.5 mg 2 times daily with food, and Norco every 4 hours as needed for pain #20 tablets. Patient is to see her primary physician for additional evaluation and management. Patient is invited to return to the emergency department immediately if any changes, problems, or concerns.   Kathie Dike, PA-C 05/31/12 1310

## 2012-05-31 NOTE — ED Notes (Signed)
Pt c/o r arm and shoulder aching, chest pain, and sob x 30 min.  Reports has felt achy all week.  Also reports some nausea.

## 2012-05-31 NOTE — ED Notes (Signed)
Discharge instructions, prescriptions and f/u information given/reviewed per Minda Ditto, RN.

## 2012-06-04 NOTE — ED Provider Notes (Signed)
Medical screening examination/treatment/procedure(s) were performed by non-physician practitioner and as supervising physician I was immediately available for consultation/collaboration.   Kathleen M McManus, DO 06/04/12 1159 

## 2012-06-28 ENCOUNTER — Ambulatory Visit: Payer: PRIVATE HEALTH INSURANCE | Admitting: Family Medicine

## 2012-07-08 ENCOUNTER — Other Ambulatory Visit: Payer: Self-pay | Admitting: Family Medicine

## 2012-07-16 ENCOUNTER — Ambulatory Visit (INDEPENDENT_AMBULATORY_CARE_PROVIDER_SITE_OTHER): Payer: PRIVATE HEALTH INSURANCE | Admitting: Family Medicine

## 2012-07-16 ENCOUNTER — Encounter: Payer: Self-pay | Admitting: Family Medicine

## 2012-07-16 VITALS — BP 132/84 | HR 100 | Resp 15 | Wt 253.4 lb

## 2012-07-16 DIAGNOSIS — E669 Obesity, unspecified: Secondary | ICD-10-CM

## 2012-07-16 DIAGNOSIS — G4700 Insomnia, unspecified: Secondary | ICD-10-CM

## 2012-07-16 DIAGNOSIS — F32A Depression, unspecified: Secondary | ICD-10-CM

## 2012-07-16 DIAGNOSIS — F3289 Other specified depressive episodes: Secondary | ICD-10-CM

## 2012-07-16 DIAGNOSIS — F411 Generalized anxiety disorder: Secondary | ICD-10-CM

## 2012-07-16 DIAGNOSIS — F329 Major depressive disorder, single episode, unspecified: Secondary | ICD-10-CM

## 2012-07-16 NOTE — Patient Instructions (Signed)
Continue current medications Fax me the labs  F/U 6 months- [employer]

## 2012-07-16 NOTE — Progress Notes (Signed)
  Subjective:    Patient ID: Jenna Love, female    DOB: 1960-08-15, 52 y.o.   MRN: [id number]  HPI Patient here to followup medications. She is doing well. She was unable to get up complete 30 day supply of her Ambien and will like this looked at. She's doing well on her Zoloft and Klonopin she is getting along better with her partner. After review her medications as a question her about the Megace she has not followed up with GYN states that she has a cyst in the vagina that they me to evaluate as well. Mammogram immunizations up-to-date   Review of Systems   GEN- denies fatigue, fever, weight loss,weakness, recent illness HEENT- denies eye drainage, change in vision, nasal discharge, CVS- denies chest pain, palpitations RESP- denies SOB, cough, wheeze ABD- denies N/V, change in stools, abd pain GU- denies dysuria, hematuria, dribbling, incontinence MSK- denies joint pain, muscle aches, injury Neuro- denies headache, dizziness, syncope, seizure activity      Objective:   Physical Exam GEN- NAD, alert and oriented x3 + weight gain HEENT- PERRL, EOMI, non injected sclera, pink conjunctiva, MMM, oropharynx clear CVS- RRR, no murmur RESP-CTAB EXT- No edema Pulses- Radial, DP- 2+ Psych- normal affect and mood        Assessment & Plan:

## 2012-07-17 NOTE — Assessment & Plan Note (Signed)
Discussed weight, diet, unable to afford weight watches, advised to write down her foods she can record how much she is eating Increase aerobic exercise 30 minutes 5 days a week

## 2012-07-17 NOTE — Assessment & Plan Note (Signed)
Doing well on current meds.

## 2012-07-17 NOTE — Assessment & Plan Note (Signed)
Doing well on ambien, it seem her insurance only covers 15 per month

## 2012-07-17 NOTE — Assessment & Plan Note (Signed)
Doing well on zoloft

## 2012-07-20 ENCOUNTER — Encounter: Payer: Self-pay | Admitting: *Deleted

## 2012-07-22 ENCOUNTER — Encounter: Payer: Self-pay | Admitting: Obstetrics & Gynecology

## 2012-07-22 ENCOUNTER — Ambulatory Visit (INDEPENDENT_AMBULATORY_CARE_PROVIDER_SITE_OTHER): Payer: PRIVATE HEALTH INSURANCE | Admitting: Obstetrics & Gynecology

## 2012-07-22 VITALS — BP 120/80 | Ht 63.0 in | Wt 248.0 lb

## 2012-07-22 DIAGNOSIS — N9489 Other specified conditions associated with female genital organs and menstrual cycle: Secondary | ICD-10-CM

## 2012-07-22 DIAGNOSIS — R102 Pelvic and perineal pain: Secondary | ICD-10-CM

## 2012-07-22 NOTE — Progress Notes (Signed)
Patient ID: Jenna Love, female   DOB: 11-Dec-1960, 52 y.o.   MRN: [id number] Patient comes in for complaint of a lump at edge of vagina Pressure but no pain  Exam  small sebaceous cyst on left vulva does not correspond to her discomfort No prolapse  No Bartholin's gland cyst  No source of pressure It corresponds simply to her levator ani muscle  Spotting from megace, dark brown stable  Normal exam

## 2012-08-14 ENCOUNTER — Telehealth: Payer: Self-pay | Admitting: Family Medicine

## 2012-08-14 MED ORDER — SERTRALINE HCL 100 MG PO TABS: ORAL_TABLET | ORAL | Status: DC

## 2012-08-14 NOTE — Telephone Encounter (Signed)
Pharmacy changed and refill of zoloft sent

## 2012-09-02 ENCOUNTER — Encounter (HOSPITAL_COMMUNITY): Payer: Self-pay

## 2012-09-02 ENCOUNTER — Emergency Department (HOSPITAL_COMMUNITY)
Admission: EM | Admit: 2012-09-02 | Discharge: 2012-09-02 | Disposition: A | Discharge status: Home / Self Care | Payer: PRIVATE HEALTH INSURANCE | Attending: Emergency Medicine | Admitting: Emergency Medicine

## 2012-09-02 DIAGNOSIS — R109 Unspecified abdominal pain: Secondary | ICD-10-CM | POA: Insufficient documentation

## 2012-09-02 DIAGNOSIS — Z9889 Other specified postprocedural states: Secondary | ICD-10-CM | POA: Insufficient documentation

## 2012-09-02 DIAGNOSIS — Z79899 Other long term (current) drug therapy: Secondary | ICD-10-CM | POA: Insufficient documentation

## 2012-09-02 DIAGNOSIS — R197 Diarrhea, unspecified: Secondary | ICD-10-CM | POA: Insufficient documentation

## 2012-09-02 DIAGNOSIS — Z862 Personal history of diseases of the blood and blood-forming organs and certain disorders involving the immune mechanism: Secondary | ICD-10-CM | POA: Insufficient documentation

## 2012-09-02 DIAGNOSIS — Z8639 Personal history of other endocrine, nutritional and metabolic disease: Secondary | ICD-10-CM | POA: Insufficient documentation

## 2012-09-02 DIAGNOSIS — F411 Generalized anxiety disorder: Secondary | ICD-10-CM | POA: Insufficient documentation

## 2012-09-02 DIAGNOSIS — Z8701 Personal history of pneumonia (recurrent): Secondary | ICD-10-CM | POA: Insufficient documentation

## 2012-09-02 DIAGNOSIS — R112 Nausea with vomiting, unspecified: Secondary | ICD-10-CM | POA: Insufficient documentation

## 2012-09-02 LAB — URINALYSIS, ROUTINE W REFLEX MICROSCOPIC
Nitrite: NEGATIVE
Protein, ur: NEGATIVE mg/dL
Specific Gravity, Urine: >1.03 — ABNORMAL HIGH (ref 1.005–1.030)
Urobilinogen, UA: 0.2 mg/dL (ref 0.0–1.0)

## 2012-09-02 LAB — URINE MICROSCOPIC-ADD ON

## 2012-09-02 MED ORDER — MORPHINE SULFATE 4 MG/ML IJ SOLN
2.0000 mg | Freq: Once | INTRAMUSCULAR | Status: AC
  Administered 2012-09-02: 2 mg via INTRAVENOUS
  Filled 2012-09-02: qty 1

## 2012-09-02 MED ORDER — SODIUM CHLORIDE 0.9 % IV BOLUS (SEPSIS)
1000.0000 mL | Freq: Once | INTRAVENOUS | Status: AC
  Administered 2012-09-02: 1000 mL via INTRAVENOUS

## 2012-09-02 MED ORDER — ONDANSETRON 4 MG PO TBDP: 4.0000 mg | ORAL_TABLET | Freq: Three times a day (TID) | ORAL | Status: DC | PRN

## 2012-09-02 MED ORDER — ONDANSETRON HCL 4 MG/2ML IJ SOLN
4.0000 mg | Freq: Once | INTRAMUSCULAR | Status: AC
  Administered 2012-09-02: 4 mg via INTRAVENOUS
  Filled 2012-09-02: qty 2

## 2012-09-02 NOTE — ED Notes (Signed)
Patient states that she is feeling a lot better at this time and is ready to be released.

## 2012-09-02 NOTE — ED Provider Notes (Signed)
History     CSN: [id number]  Arrival date & time 09/02/12  0030   First MD Initiated Contact with Patient 09/02/12 0053      Chief Complaint  Patient presents with  . Emesis  . Diarrhea    (Consider location/radiation/quality/duration/timing/severity/associated sxs/prior treatment) HPI HPI Comments: Jenna Love is a 52 y.o. female who presents to the Emergency Department complaining of nausea, vomiting and diarrhea associated with abdominal pain that began earlier tonight. She has had diarrhea and vomiting until she is tired. Denies fever, chills, shortness of breath, chest pain.   PCP Dr. Jeanice Lim Past Medical History  Diagnosis Date  . Anxiety   . Panic attacks   . Thyroid nodule 2013    Benign biopsy- ENT Dr. Pollyann Kennedy  . Pneumonia   . Panic attacks   . Anxiety     Past Surgical History  Procedure Laterality Date  . Cesarean section  1992    Family History  Problem Relation Age of Onset  . Diabetes Mother   . Alzheimer's disease Mother   . Cancer Father     lung     History  Substance Use Topics  . Smoking status: Never Smoker   . Smokeless tobacco: Not on file  . Alcohol Use: No    OB History   Grav Para Term Preterm Abortions TAB SAB Ect Mult Living   2 2              Review of Systems  Constitutional: Negative for fever.       10 Systems reviewed and are negative for acute change except as noted in the HPI.  HENT: Negative for congestion.   Eyes: Negative for discharge and redness.  Respiratory: Negative for cough and shortness of breath.   Cardiovascular: Negative for chest pain.  Gastrointestinal: Positive for nausea, vomiting and diarrhea. Negative for abdominal pain.  Musculoskeletal: Negative for back pain.  Skin: Negative for rash.  Neurological: Negative for syncope, numbness and headaches.  Psychiatric/Behavioral:       No behavior change.    Allergies  Review of patient's allergies indicates no known allergies.  Home  Medications   Current Outpatient Rx  Name  Route  Sig  Dispense  Refill  . cholecalciferol (VITAMIN D) 400 UNITS TABS   Oral   Take 400 Units by mouth.         . clonazePAM (KLONOPIN) 1 MG tablet      TAKE 1 TABLET BY MOUTH THREE TIMES DAILY AS NEEDED   60 tablet   2   . megestrol (MEGACE) 40 MG tablet   Oral   Take 40 mg by mouth daily.         . sertraline (ZOLOFT) 100 MG tablet      TAKE 1 &  1/2 TABLET BY MOUTH EVERY DAY   45 tablet   4   . vitamin C (ASCORBIC ACID) 500 MG tablet   Oral   Take 500 mg by mouth daily.         Marland Kitchen zolpidem (AMBIEN) 10 MG tablet      TAKE 1 TABLET BY MOUTH EVERY NIGHT AT BEDTIME AS NEEDED   30 tablet   3   . ondansetron (ZOFRAN ODT) 4 MG disintegrating tablet   Oral   Take 1 tablet (4 mg total) by mouth every 8 (eight) hours as needed for nausea.   20 tablet   0     BP 108/87  Pulse 111  Temp(Src) 98 F (36.7 C) (Oral)  Resp 20  Ht 5\' 3"  (1.6 m)  Wt 230 lb (104.327 kg)  BMI 40.75 kg/m2  SpO2 100%  Physical Exam  Nursing note and vitals reviewed. Constitutional: She appears well-developed and well-nourished.  Awake, alert, nontoxic appearance.  HENT:  Head: Normocephalic and atraumatic.  Eyes: EOM are normal. Pupils are equal, round, and reactive to light.  Neck: Normal range of motion. Neck supple.  Cardiovascular: Normal rate and intact distal pulses.   Pulmonary/Chest: Effort normal and breath sounds normal. She exhibits no tenderness.  Abdominal: Soft. Bowel sounds are normal. There is no tenderness. There is no rebound.  vomiting  Musculoskeletal: Normal range of motion. She exhibits no tenderness.  Baseline ROM, no obvious new focal weakness.  Neurological:  Mental status and motor strength appears baseline for patient and situation.  Skin: No rash noted.  Psychiatric: She has a normal mood and affect.    ED Course  Procedures (including critical care time) Results for orders placed during the  hospital encounter of 09/02/12  URINALYSIS, ROUTINE W REFLEX MICROSCOPIC      Result Value Range   Color, Urine YELLOW  YELLOW   APPearance CLEAR  CLEAR   Specific Gravity, Urine >1.030 (*) 1.005 - 1.030   pH 6.0  5.0 - 8.0   Glucose, UA NEGATIVE  NEGATIVE mg/dL   Hgb urine dipstick MODERATE (*) NEGATIVE   Bilirubin Urine NEGATIVE  NEGATIVE   Ketones, ur 15 (*) NEGATIVE mg/dL   Protein, ur NEGATIVE  NEGATIVE mg/dL   Urobilinogen, UA 0.2  0.0 - 1.0 mg/dL   Nitrite NEGATIVE  NEGATIVE   Leukocytes, UA NEGATIVE  NEGATIVE  URINE MICROSCOPIC-ADD ON      Result Value Range   Squamous Epithelial / LPF FEW (*) RARE   WBC, UA 0-2  <3 WBC/hpf   RBC / HPF 3-6  <3 RBC/hpf   Bacteria, UA FEW (*) RARE    Medications  sodium chloride 0.9 % bolus 1,000 mL (0 mLs Intravenous Stopped 09/02/12 0337)  ondansetron (ZOFRAN) injection 4 mg (4 mg Intravenous Given 09/02/12 0150)  morphine 4 MG/ML injection 2 mg (2 mg Intravenous Given 09/02/12 0151)    1. Nausea vomiting and diarrhea     0144 Ordered zofran and morphine. 0215 Patient feeling better. Taking PO fluids.   MDM  Patient with nausea, vomiting, diarrhea. Given IVF, zofran and morphine with improvement. Taking PO fluids. No further vomiting or diarrhea. Pt stable in ED with no significant deterioration in condition.The patient appears reasonably screened and/or stabilized for discharge and I doubt any other medical condition or other [facility] requiring further screening, evaluation, or treatment in the ED at this time prior to discharge.  MDM Reviewed: nursing note and vitals Interpretation: labs           Nicoletta Dress. Colon Branch, MD 09/02/12 [id number]

## 2012-09-17 ENCOUNTER — Other Ambulatory Visit: Payer: Self-pay | Admitting: Otolaryngology

## 2012-09-18 ENCOUNTER — Other Ambulatory Visit: Payer: Self-pay | Admitting: Family Medicine

## 2012-09-19 NOTE — Telephone Encounter (Signed)
Approved. #15/ 2 refills

## 2012-09-19 NOTE — Telephone Encounter (Signed)
?   Ok to refill.Comal pt

## 2012-09-19 NOTE — Telephone Encounter (Signed)
Med refilled.

## 2012-09-26 ENCOUNTER — Other Ambulatory Visit: Payer: Self-pay | Admitting: Family Medicine

## 2012-09-26 NOTE — Telephone Encounter (Signed)
Ok to refill 

## 2012-09-27 NOTE — Telephone Encounter (Signed)
Printed out script Dr.Pickard signed and faxed over to pharmacy

## 2012-09-27 NOTE — Telephone Encounter (Signed)
Trid calling pharmacy again still not working

## 2012-09-27 NOTE — Telephone Encounter (Signed)
Tried calling pharmacy and phone is not in order. Will try again at later time

## 2012-10-25 ENCOUNTER — Other Ambulatory Visit: Payer: Self-pay | Admitting: Family Medicine

## 2012-10-25 NOTE — Telephone Encounter (Signed)
?   OK to Refill  

## 2012-10-28 NOTE — Telephone Encounter (Signed)
ok 

## 2012-11-05 ENCOUNTER — Other Ambulatory Visit: Payer: Self-pay | Admitting: Obstetrics & Gynecology

## 2012-11-12 ENCOUNTER — Other Ambulatory Visit: Payer: Self-pay | Admitting: Family Medicine

## 2012-11-12 NOTE — Telephone Encounter (Signed)
Med refilled.

## 2012-11-12 NOTE — Telephone Encounter (Signed)
Okay to refill? 

## 2012-11-12 NOTE — Telephone Encounter (Signed)
?   Ok to refill last refill 09/18/12

## 2012-11-25 ENCOUNTER — Other Ambulatory Visit: Payer: Self-pay | Admitting: Family Medicine

## 2012-11-25 NOTE — Telephone Encounter (Signed)
Okay to refill, give 3 refills 

## 2012-11-25 NOTE — Telephone Encounter (Signed)
Med phoned in °

## 2012-11-25 NOTE — Telephone Encounter (Signed)
?   Ok to refill, last refill 10/25/12

## 2012-12-12 ENCOUNTER — Telehealth: Payer: Self-pay | Admitting: Family Medicine

## 2012-12-12 NOTE — Telephone Encounter (Signed)
Ambien 10 mg tab 1 QHS prn #30 last rf 11/14/12

## 2012-12-13 ENCOUNTER — Other Ambulatory Visit: Payer: Self-pay | Admitting: Family Medicine

## 2012-12-13 NOTE — Telephone Encounter (Signed)
Yes, still pt pt, okay to refill

## 2012-12-13 NOTE — Telephone Encounter (Signed)
Last refill 8/26  #30.  Last OV 4/29   Is still your pt here??

## 2012-12-13 NOTE — Telephone Encounter (Signed)
RX called in .

## 2012-12-16 NOTE — Telephone Encounter (Signed)
ambein was refilled on 12/13/12

## 2013-01-13 ENCOUNTER — Telehealth: Payer: Self-pay | Admitting: Family Medicine

## 2013-01-13 NOTE — Telephone Encounter (Signed)
Okay to refill, give 3 refills 

## 2013-01-14 MED ORDER — ZOLPIDEM TARTRATE 10 MG PO TABS: ORAL_TABLET | ORAL | Status: DC

## 2013-01-14 NOTE — Telephone Encounter (Signed)
Med phoned in °

## 2013-01-29 ENCOUNTER — Ambulatory Visit: Payer: PRIVATE HEALTH INSURANCE | Admitting: Physician Assistant

## 2013-02-21 ENCOUNTER — Ambulatory Visit (INDEPENDENT_AMBULATORY_CARE_PROVIDER_SITE_OTHER): Payer: PRIVATE HEALTH INSURANCE | Admitting: Family Medicine

## 2013-02-21 VITALS — BP 110/70 | HR 60 | Temp 98.2°F | Resp 18 | Ht 63.0 in | Wt 261.0 lb

## 2013-02-21 DIAGNOSIS — E669 Obesity, unspecified: Secondary | ICD-10-CM

## 2013-02-21 DIAGNOSIS — E781 Pure hyperglyceridemia: Secondary | ICD-10-CM

## 2013-02-21 DIAGNOSIS — J019 Acute sinusitis, unspecified: Secondary | ICD-10-CM

## 2013-02-21 MED ORDER — AZITHROMYCIN 250 MG PO TABS: ORAL_TABLET | ORAL | Status: DC

## 2013-02-21 NOTE — Patient Instructions (Signed)
Continue current medications Start zpak Mucinex - Decongestant  Get the labs done fasting  F/U 4 months

## 2013-02-21 NOTE — Progress Notes (Signed)
   Subjective:    Patient ID: Jenna Love, female    DOB: [birth year], 52 y.o.   MRN: [id number]  HPI  Patient here to follow chronic medical problems. She complains of sinus pressure and congestion for the past 4 weeks. She has a history of sinusitis as well as a chronic middle ear effusion which was treated by ear nose and throat. She's not used any over-the-counter medications. She denies any fever cough or shortness of breath. She's tolerating her other medications as prescribed. She's not had any difficulties with her Zoloft or Klonopin  She recently restarted working out she noticed that she had gained some weight over the past few months due to lack of exercise and attention to diet.   Review of Systems  GEN- denies fatigue, fever, weight loss,weakness, recent illness HEENT- denies eye drainage, change in vision,+ nasal discharge, CVS- denies chest pain, palpitations RESP- denies SOB, cough, wheeze MSK- denies joint pain, muscle aches, injury Neuro- denies headache, dizziness, syncope, seizure activity      Objective:   Physical Exam GEN- NAD, alert and oriented x3 HEENT- PERRL, EOMI, non injected sclera, pink conjunctiva, MMM, oropharynx mild injection, TM clear bilat no effusion,  + maxillary sinus tenderness, inflammed turbinates,  Nasal drainage  Neck- Supple, no LAD CVS- RRR, no murmur RESP-CTAB EXT- No edema Pulses- Radial 2+         Assessment & Plan:

## 2013-02-23 ENCOUNTER — Encounter: Payer: Self-pay | Admitting: Family Medicine

## 2013-02-23 DIAGNOSIS — J019 Acute sinusitis, unspecified: Secondary | ICD-10-CM | POA: Insufficient documentation

## 2013-02-23 NOTE — Assessment & Plan Note (Signed)
She does well with azithromycin we'll place her on a Z-Pak as well as Mucinex decongestant

## 2013-02-23 NOTE — Assessment & Plan Note (Signed)
Recheck fasting lipid panel and triglycerides

## 2013-02-23 NOTE — Assessment & Plan Note (Signed)
She's gained 30 pounds since her last visit. She will be sent for fasting labs. Discussed importance of weight loss and healthy weight. She's return to exercising recently

## 2013-03-25 ENCOUNTER — Other Ambulatory Visit: Payer: Self-pay | Admitting: Family Medicine

## 2013-03-25 NOTE — Telephone Encounter (Signed)
Last RF 9/8 #60 + 3.  Last OV 12/5  OK refill?

## 2013-03-26 ENCOUNTER — Other Ambulatory Visit: Payer: Self-pay | Admitting: Family Medicine

## 2013-03-26 NOTE — Telephone Encounter (Signed)
Approved for #60+2 additional refills 

## 2013-03-26 NOTE — Telephone Encounter (Signed)
RX called in .

## 2013-04-14 ENCOUNTER — Ambulatory Visit (INDEPENDENT_AMBULATORY_CARE_PROVIDER_SITE_OTHER): Payer: PRIVATE HEALTH INSURANCE | Admitting: Family Medicine

## 2013-04-14 VITALS — BP 100/70 | HR 80 | Temp 98.1°F | Resp 18 | Ht 63.0 in | Wt 266.0 lb

## 2013-04-14 DIAGNOSIS — J019 Acute sinusitis, unspecified: Secondary | ICD-10-CM

## 2013-04-14 DIAGNOSIS — J40 Bronchitis, not specified as acute or chronic: Secondary | ICD-10-CM

## 2013-04-14 DIAGNOSIS — J42 Unspecified chronic bronchitis: Secondary | ICD-10-CM

## 2013-04-14 DIAGNOSIS — E041 Nontoxic single thyroid nodule: Secondary | ICD-10-CM

## 2013-04-14 DIAGNOSIS — J029 Acute pharyngitis, unspecified: Secondary | ICD-10-CM

## 2013-04-14 DIAGNOSIS — J209 Acute bronchitis, unspecified: Secondary | ICD-10-CM

## 2013-04-14 LAB — RAPID STREP SCREEN (MED CTR MEBANE ONLY): Streptococcus, Group A Screen (Direct): NEGATIVE

## 2013-04-14 MED ORDER — PREDNISONE 10 MG PO TABS: ORAL_TABLET | ORAL | Status: DC

## 2013-04-14 MED ORDER — METHYLPREDNISOLONE ACETATE 40 MG/ML IJ SUSP
40.0000 mg | Freq: Once | INTRAMUSCULAR | Status: AC
  Administered 2013-04-14: 40 mg via INTRAMUSCULAR

## 2013-04-14 NOTE — Patient Instructions (Signed)
Complete the levaquin Start prednisone tomorrow Referral to Dr. Fransico HimNIDA PFT lung test to be done F/U as previous

## 2013-04-14 NOTE — Progress Notes (Signed)
   Subjective:    Patient ID: Jenna Love, female    DOB: [birth year], 53 y.o.   MRN: [id number]  HPI Patient here to followup recent bronchitis and sinusitis. She was seen at the urgent care and was prescribed Levaquin and over-the-counter cough medicine. She has improved  some but but this is the third time she's been sick this season. Continues to have sinus drainage as well as wheezing and feeling short of breath. She is a nonsmoker. She also requires referral to endocrinology to followup her thyroid nodules and she is unable to continue going [place].   Review of Systems   GEN- denies fatigue, fever, weight loss,weakness, recent illness HEENT- denies eye drainage, change in vision, +nasal discharge, CVS- denies chest pain, palpitations RESP- denies SOB, +cough, wheeze Neuro- denies headache, dizziness, syncope, seizure activity      Objective:   Physical Exam   GEN- NAD, alert and oriented x3 HEENT- PERRL, EOMI, non injected sclera, pink conjunctiva, MMM, oropharynx mild injection, TM clear bilat no effusion,mild maxillary sinus tenderness,+  Nasal drainage  Neck- Supple, shotty LAD CVS- RRR, no murmur RESP-CTAB EXT- No edema Pulses- Radial 2+        Assessment & Plan:

## 2013-04-15 ENCOUNTER — Encounter: Payer: Self-pay | Admitting: Family Medicine

## 2013-04-15 DIAGNOSIS — J209 Acute bronchitis, unspecified: Secondary | ICD-10-CM | POA: Insufficient documentation

## 2013-04-15 DIAGNOSIS — E041 Nontoxic single thyroid nodule: Secondary | ICD-10-CM | POA: Insufficient documentation

## 2013-04-15 NOTE — Assessment & Plan Note (Signed)
She has had negative biopsy done on her thyroid nodules but they need to continue to be monitored. She's unable to [place] in [place] therefore we will establish her with [place]ocrinology in [place]

## 2013-04-15 NOTE — Assessment & Plan Note (Signed)
Continued antibiotic

## 2013-04-15 NOTE — Assessment & Plan Note (Addendum)
I'm concerned that she continues to have bronchitis. She is a nonsmoker ,she's had multiple respiratory infections with bronchitis and wheezing. I will obtain a pulmonary function test She can complete her current medications I will add Depo-Medrol in a steroid burst to her regimen

## 2013-04-24 ENCOUNTER — Other Ambulatory Visit (HOSPITAL_COMMUNITY): Payer: Self-pay | Admitting: "Endocrinology

## 2013-04-24 DIAGNOSIS — R221 Localized swelling, mass and lump, neck: Secondary | ICD-10-CM

## 2013-04-29 ENCOUNTER — Ambulatory Visit (HOSPITAL_COMMUNITY)
Admission: RE | Admit: 2013-04-29 | Discharge: 2013-04-29 | Disposition: A | Discharge status: Home / Self Care | Payer: PRIVATE HEALTH INSURANCE | Source: Ambulatory Visit | Attending: "Endocrinology | Admitting: "Endocrinology

## 2013-04-29 DIAGNOSIS — E042 Nontoxic multinodular goiter: Secondary | ICD-10-CM | POA: Insufficient documentation

## 2013-04-29 DIAGNOSIS — R1310 Dysphagia, unspecified: Secondary | ICD-10-CM | POA: Insufficient documentation

## 2013-04-29 DIAGNOSIS — R221 Localized swelling, mass and lump, neck: Secondary | ICD-10-CM

## 2013-05-01 ENCOUNTER — Ambulatory Visit (HOSPITAL_COMMUNITY)
Admission: RE | Admit: 2013-05-01 | Discharge: 2013-05-01 | Disposition: A | Discharge status: Home / Self Care | Payer: PRIVATE HEALTH INSURANCE | Source: Ambulatory Visit | Attending: Family Medicine | Admitting: Family Medicine

## 2013-05-01 DIAGNOSIS — J40 Bronchitis, not specified as acute or chronic: Secondary | ICD-10-CM | POA: Insufficient documentation

## 2013-05-01 DIAGNOSIS — R1013 Epigastric pain: Secondary | ICD-10-CM | POA: Insufficient documentation

## 2013-05-01 LAB — PULMONARY FUNCTION TEST
FEF 25-75 Pre: 3.36 L/sec
FEF2575-%Pred-Pre: 124 %
FEV1-%Pred-Pre: 65 %
FEV1-Pre: 1.77 L
FEV1FVC-%PRED-PRE: 115 %
FEV6-%PRED-PRE: 57 %
FEV6-Pre: 1.93 L
FEV6FVC-%PRED-PRE: 103 %
FVC-%PRED-PRE: 56 %
FVC-Pre: 1.93 L
PRE FEV1/FVC RATIO: 92 %
PRE FEV6/FVC RATIO: 100 %
RV % pred: 114 %
RV: 1.98 L
TLC % pred: 70 %
TLC: 3.43 L

## 2013-05-01 MED ORDER — ALBUTEROL SULFATE (2.5 MG/3ML) 0.083% IN NEBU
2.5000 mg | INHALATION_SOLUTION | Freq: Once | RESPIRATORY_TRACT | Status: AC
  Administered 2013-05-01: 2.5 mg via RESPIRATORY_TRACT

## 2013-05-09 ENCOUNTER — Other Ambulatory Visit: Payer: Self-pay | Admitting: Family Medicine

## 2013-05-12 NOTE — Telephone Encounter (Signed)
Ok to refill 

## 2013-05-12 NOTE — Telephone Encounter (Signed)
Okay to refill, give 3  

## 2013-05-15 NOTE — Procedures (Signed)
NAMChong Sicilian:  JUSTICE, Charliene              ACCOUNT NO.:  [id number]  MEDICAL RECORD NO.:  [id number]  LOCATION:                                 FACILITY:  PHYSICIAN:  Karyss Frese L. Juanetta GoslingHawkins, M.D.DATE OF BIRTH:  Aug 29, 1960  DATE OF PROCEDURE:  05/14/2013 DATE OF DISCHARGE:                           PULMONARY FUNCTION TEST   Reason for pulmonary function testing is bronchitis. 1. Spirometry shows a moderate ventilatory defect without definite     airflow obstruction. 2. Lung volumes show reduction in total lung capacity. 3. Airway resistance is normal. 4. This study does not show airflow obstruction, but more evidence of     restrictive change.     Cambri Plourde L. Juanetta GoslingHawkins, M.D.     ELH/MEDQ  D:  05/14/2013  T:  05/15/2013  Job:  [id number]  cc:   Dr. Jeanice Limurham

## 2013-05-19 ENCOUNTER — Telehealth: Payer: Self-pay | Admitting: *Deleted

## 2013-05-19 NOTE — Telephone Encounter (Signed)
Call placed to patient to make aware that labs appear normal. States she still has cough and SOB. Scheduled appointment with MD.

## 2013-05-19 NOTE — Telephone Encounter (Signed)
Message copied by Phillips OdorSIX, Jabin Tapp H on Mon May 19, 2013  2:37 PM ------      Message from: Harriet MassonOBERTS, CHELSEA N      Created: Mon May 19, 2013  1:54 PM      Regarding: Lung Test Results      Contact: [phone]       Pt states she had a lung test done back in Feb at [facility] and she would like to know the results of it  ------

## 2013-05-20 ENCOUNTER — Encounter: Payer: Self-pay | Admitting: Family Medicine

## 2013-05-20 ENCOUNTER — Ambulatory Visit (INDEPENDENT_AMBULATORY_CARE_PROVIDER_SITE_OTHER): Payer: PRIVATE HEALTH INSURANCE | Admitting: Family Medicine

## 2013-05-20 VITALS — BP 118/62 | HR 84 | Temp 98.3°F | Resp 18 | Ht 63.75 in | Wt 259.5 lb

## 2013-05-20 DIAGNOSIS — R05 Cough: Secondary | ICD-10-CM | POA: Insufficient documentation

## 2013-05-20 DIAGNOSIS — R053 Chronic cough: Secondary | ICD-10-CM | POA: Insufficient documentation

## 2013-05-20 DIAGNOSIS — R059 Cough, unspecified: Secondary | ICD-10-CM

## 2013-05-20 DIAGNOSIS — E041 Nontoxic single thyroid nodule: Secondary | ICD-10-CM

## 2013-05-20 HISTORY — DX: Chronic cough: R05.3

## 2013-05-20 NOTE — Assessment & Plan Note (Signed)
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 I will obtain a chest x-ray. Her exam but overall is benign. I did give her a sample of Advair 100/50 to use one puff twice a day and she did improve with the albuterol treatment. She will do this for 2 weeks. If all else is negative the we have no improvement I will send her to pulmonary

## 2013-05-20 NOTE — Patient Instructions (Signed)
Try the advair as prescribed Get CXR Schedule a Mammogram F/U as previous

## 2013-05-20 NOTE — Progress Notes (Signed)
Patient ID: Jenna Love, female   DOB: 1960-04-02, 53 y.o.   MRN: [id number]     Subjective:    Patient ID: Jenna Love, female    DOB: 1960-04-02, 53 y.o.   MRN: [id number]  Patient presents for SOB/Cough  Patient here with persistent shortness of breath and cough. She coughs throughout the day where she now has pain between her shoulder blades. Her cough is nonproductive. She was treated for bronchitis as well as sinusitis within the past couple months but still has not improved regarding the cough. She did have a pulmonary function tests which showed some mild decrease lung volume and possible restricted lung disease but it was not very overwhelming. She states that she did improve after she had the albuterol which was nebulized at the pulmonary function tests.  She was seen by endocrinology for her thyroid nodule and neck swelling. The repeat ultrasound showed a multinodular goiter she also had blood work but I do not have the results of this. She's concerned about cancer   Review Of Systems:  GEN- denies fatigue, fever, weight loss,weakness, recent illness HEENT- denies eye drainage, change in vision, nasal discharge, CVS- denies chest pain, palpitations RESP- denies SOB, +cough, wheeze Neuro- denies headache, dizziness, syncope, seizure activity       Objective:    BP 118/62  Pulse 84  Temp(Src) 98.3 F (36.8 C)  Resp 18  Ht 5' 3.75" (1.619 m)  Wt 259 lb 8 oz (117.708 kg)  BMI 44.91 kg/m2  SpO2 98% GEN- NAD, alert and oriented x3 HEENT- PERRL, EOMI, non injected sclera, pink conjunctiva, MMM, oropharynx clear Neck- Supple, Thyroid goiter, +LAD CVS- RRR, no murmur RESP-CTAB, no wheeze, harsh cough EXT- No edema Pulse- 2+        Assessment & Plan:      Problem List Items Addressed This Visit   None    Visit Diagnoses   Cough    -  Primary    Relevant Orders       DG Chest 2 View       Note: This dictation was prepared with Dragon dictation along  with smaller phrase technology. Any transcriptional errors that result from this process are unintentional.

## 2013-05-20 NOTE — Assessment & Plan Note (Signed)
She was reassured regarding the neck fullness with the thyroid goiter and nodules. I will obtain the labs from endocrinology and reviewed with her.

## 2013-05-21 ENCOUNTER — Other Ambulatory Visit: Payer: Self-pay | Admitting: Family Medicine

## 2013-05-21 ENCOUNTER — Ambulatory Visit (HOSPITAL_COMMUNITY)
Admission: RE | Admit: 2013-05-21 | Discharge: 2013-05-21 | Disposition: A | Discharge status: Home / Self Care | Payer: PRIVATE HEALTH INSURANCE | Source: Ambulatory Visit | Attending: Family Medicine | Admitting: Family Medicine

## 2013-05-21 DIAGNOSIS — Z1231 Encounter for screening mammogram for malignant neoplasm of breast: Secondary | ICD-10-CM

## 2013-05-21 DIAGNOSIS — R05 Cough: Secondary | ICD-10-CM

## 2013-05-21 DIAGNOSIS — M25519 Pain in unspecified shoulder: Secondary | ICD-10-CM | POA: Insufficient documentation

## 2013-05-21 DIAGNOSIS — R059 Cough, unspecified: Secondary | ICD-10-CM | POA: Insufficient documentation

## 2013-05-21 DIAGNOSIS — Z8701 Personal history of pneumonia (recurrent): Secondary | ICD-10-CM | POA: Insufficient documentation

## 2013-05-21 NOTE — Progress Notes (Signed)
LMTRC

## 2013-05-23 ENCOUNTER — Ambulatory Visit (HOSPITAL_COMMUNITY): Payer: PRIVATE HEALTH INSURANCE

## 2013-05-26 ENCOUNTER — Ambulatory Visit (HOSPITAL_COMMUNITY)
Admission: RE | Admit: 2013-05-26 | Discharge: 2013-05-26 | Disposition: A | Discharge status: Home / Self Care | Payer: PRIVATE HEALTH INSURANCE | Source: Ambulatory Visit | Attending: Family Medicine | Admitting: Family Medicine

## 2013-05-26 DIAGNOSIS — Z1231 Encounter for screening mammogram for malignant neoplasm of breast: Secondary | ICD-10-CM | POA: Insufficient documentation

## 2013-05-27 ENCOUNTER — Telehealth: Payer: Self-pay | Admitting: Family Medicine

## 2013-05-27 NOTE — Telephone Encounter (Signed)
Please call pt, her labs were reviewed from Dr. Fransico HimNIDA She is not diabetic and thyroid labs were normal Also the thyroid goiter and nodules were benign \  Cholesterol was not checked- she needs to go fasting to have this done in [place] and we will mail her results

## 2013-05-28 NOTE — Telephone Encounter (Signed)
Call placed to patient and patient made aware.  

## 2013-05-29 ENCOUNTER — Telehealth: Payer: Self-pay | Admitting: *Deleted

## 2013-05-29 NOTE — Telephone Encounter (Signed)
Message copied by Phillips OdorSIX, CHRISTINA H on Thu May 29, 2013  3:28 PM ------      Message from: Harriet MassonOBERTS, CHELSEA N      Created: Thu May 29, 2013  3:21 PM      Regarding: Results      Contact: [phone]       Pt is wanting to know if her mammogram results are back  ------

## 2013-05-29 NOTE — Telephone Encounter (Signed)
Call placed to patient and advised that mammogram appears negative.   Also advised that letter with results was sent out from Imaging center.

## 2013-06-03 ENCOUNTER — Ambulatory Visit: Payer: Self-pay | Admitting: Family Medicine

## 2013-06-16 ENCOUNTER — Other Ambulatory Visit: Payer: Self-pay | Admitting: Physician Assistant

## 2013-06-16 NOTE — Telephone Encounter (Signed)
Okay to refill, give 3  

## 2013-06-16 NOTE — Telephone Encounter (Signed)
Ok to refill??  Last office visit 05/20/2013.  Last refill 04/04/2013.

## 2013-06-16 NOTE — Telephone Encounter (Signed)
Medication called to pharmacy. 

## 2013-06-24 ENCOUNTER — Other Ambulatory Visit: Payer: Self-pay | Admitting: Family Medicine

## 2013-06-24 NOTE — Telephone Encounter (Signed)
Medication refilled per protocol. 

## 2013-08-07 ENCOUNTER — Other Ambulatory Visit: Payer: Self-pay | Admitting: Family Medicine

## 2013-08-07 NOTE — Telephone Encounter (Signed)
Refill appropriate and filled per protocol. 

## 2013-08-20 ENCOUNTER — Ambulatory Visit (INDEPENDENT_AMBULATORY_CARE_PROVIDER_SITE_OTHER): Payer: PRIVATE HEALTH INSURANCE | Admitting: Obstetrics and Gynecology

## 2013-08-20 ENCOUNTER — Encounter: Payer: Self-pay | Admitting: Obstetrics and Gynecology

## 2013-08-20 VITALS — BP 118/70 | Ht 63.0 in | Wt 255.0 lb

## 2013-08-20 DIAGNOSIS — N950 Postmenopausal bleeding: Secondary | ICD-10-CM

## 2013-08-20 LAB — HCG, SERUM, QUALITATIVE: Preg, Serum: NEGATIVE

## 2013-08-20 LAB — FOLLICLE STIMULATING HORMONE: FSH: 28.6 m[IU]/mL

## 2013-08-20 NOTE — Patient Instructions (Signed)
Endometrial Biopsy Endometrial biopsy is a procedure in which a tissue sample is taken from inside the uterus. The tissue sample is then looked at under a microscope to see if the tissue is normal or abnormal. The endometrium is the lining of the uterus. This procedure helps determine where you are in your menstrual cycle and how hormone levels are affecting the lining of the uterus. This procedure may also be used to evaluate uterine bleeding or to diagnose endometrial cancer, tuberculosis, polyps, or inflammatory conditions.  LET YOUR HEALTH CARE PROVIDER KNOW ABOUT:  Any allergies you have.  All medicines you are taking, including vitamins, herbs, eye drops, creams, and over-the-counter medicines.  Previous problems you or members of your family have had with the use of anesthetics.  Any blood disorders you have.  Previous surgeries you have had.  Medical conditions you have.  Possibility of pregnancy. RISKS AND COMPLICATIONS Generally, this is a safe procedure. However, as with any procedure, complications can occur. Possible complications include:  Bleeding.  Pelvic infection.  Puncture of the uterine wall with the biopsy device (rare). BEFORE THE PROCEDURE   Keep a record of your menstrual cycles as directed by your health care provider. You may need to schedule your procedure for a specific time in your cycle.  You may want to bring a sanitary pad to wear home after the procedure.  Arrange for someone to drive you home after the procedure if you will be given a medicine to help you relax (sedative). PROCEDURE   You may be given a sedative to relax you.  You will lie on an exam table with your feet and legs supported as in a pelvic exam.  Your health care provider will insert an instrument (speculum) into your vagina to see your cervix.  Your cervix will be cleansed with an antiseptic solution. A medicine (local anesthetic) will be used to numb the cervix.  A forceps  instrument (tenaculum) will be used to hold your cervix steady for the biopsy.  A thin, rodlike instrument (uterine sound) will be inserted through your cervix to determine the length of your uterus and the location where the biopsy sample will be removed.  A thin, flexible tube (catheter) will be inserted through your cervix and into the uterus. The catheter is used to collect the biopsy sample from your endometrial tissue.  The catheter and speculum will then be removed, and the tissue sample will be sent to a lab for examination. AFTER THE PROCEDURE  You will rest in a recovery area until you are ready to go home.  You may have mild cramping and a small amount of vaginal bleeding for a few days after the procedure. This is normal.  Make sure you find out how to get your test results. Document Released: 07/07/2004 Document Revised: 11/06/2012 Document Reviewed: 08/21/2012 [employer] Patient Information 2014 [employer].  

## 2013-08-20 NOTE — Progress Notes (Signed)
This chart was scribed by Leone Payor, Medical Scribe, for Dr. Christin Bach on 08/20/13 at 11:49 AM. This chart was reviewed by Dr. Christin Bach for accuracy.   [facility] Visit  Patient name: Jenna Love MRN [id number]  Date of birth: 26-May-1960  CC & HPI:  Jenna Love is a 53 y.o. female presenting today for vaginal bleeding lasting 5 days. Patient states her LNMP was in April 2014. She has not had any bleeding or spotting since then until 08/13/13. She describes the bleeding similar to her normal periods in the past.   ROS:  +vaginal bleeding Associated + right suprapubic pain  Pertinent History Reviewed:  Medical & Surgical Hx:  Reviewed: Significant for  Past Medical History  Diagnosis Date  . Anxiety   . Panic attacks   . Thyroid nodule 2013    Benign biopsy- ENT Dr. Pollyann Kennedy  . Pneumonia   . Panic attacks   . Anxiety     Past Surgical History  Procedure Laterality Date  . Cesarean section  1992    Medications: Reviewed & Updated - see associated section Social History: Reviewed -  reports that she has never smoked. She does not have any smokeless tobacco history on file.  Objective Findings:  Vitals: BP 118/70  Ht 5\' 3"  (1.6 m)  Wt 255 lb (115.667 kg)  BMI 45.18 kg/m2  LMP 08/13/2013  Physical Examination: General appearance - alert, well appearing, and in no distress and oriented to person, place, and time Pelvic - VULVA: normal appearing vulva with no masses, tenderness or lesions,  VAGINA: atrophic, dark blood present  CERVIX: normal appearing cervix without discharge or lesions,  UTERUS: uterus is normal size, shape, consistency and nontender, exam limited by body mass,  ADNEXA: normal adnexa in size, nontender and no masses   Assessment & Plan:   A: 1. Postmenopausal bleeding   P: 1. Blood test to check FSH, seum HCG, and pelvic ultrasound 2. Endometrial biopsy  Likely at return

## 2013-08-22 ENCOUNTER — Encounter: Payer: Self-pay | Admitting: Obstetrics and Gynecology

## 2013-08-29 ENCOUNTER — Other Ambulatory Visit: Payer: Self-pay | Admitting: Obstetrics and Gynecology

## 2013-08-29 DIAGNOSIS — N950 Postmenopausal bleeding: Secondary | ICD-10-CM

## 2013-09-01 ENCOUNTER — Ambulatory Visit (INDEPENDENT_AMBULATORY_CARE_PROVIDER_SITE_OTHER): Payer: PRIVATE HEALTH INSURANCE

## 2013-09-01 ENCOUNTER — Ambulatory Visit (INDEPENDENT_AMBULATORY_CARE_PROVIDER_SITE_OTHER): Payer: PRIVATE HEALTH INSURANCE | Admitting: Obstetrics and Gynecology

## 2013-09-01 ENCOUNTER — Encounter: Payer: Self-pay | Admitting: Obstetrics and Gynecology

## 2013-09-01 VITALS — BP 120/74 | Ht 63.0 in | Wt 254.0 lb

## 2013-09-01 DIAGNOSIS — N950 Postmenopausal bleeding: Secondary | ICD-10-CM

## 2013-09-01 DIAGNOSIS — Z3202 Encounter for pregnancy test, result negative: Secondary | ICD-10-CM

## 2013-09-01 LAB — POCT URINE PREGNANCY: Preg Test, Ur: NEGATIVE

## 2013-09-01 NOTE — Progress Notes (Signed)
This chart was scribed by Leone PayorSonum Patel, Medical Scribe, for Dr. Christin BachJohn Elara Cocke on 09/01/13 at 12:30 PM. This chart was reviewed by Dr. Christin BachJohn Sheriff Rodenberg for accuracy.   Jenna Love is a 53 y.o. female presents for endometrial biopsy today.   Endometrial Biopsy: Patient given informed consent, signed copy in the chart, time out was performed. Time out taken. The patient was placed in the lithotomy position and the cervix brought into view with sterile speculum.  Portion of cervix cleansed x 2 with betadine swabs.  A tenaculum was placed in the anterior lip of the cervix.  Procedure was unsuccessful due to cervical stenosis and discomfort associated with access to cervix. Cervix is noted to be very anterior and longest snowman speculum required to visualize the cervix.   Patient given post procedure instructions.   she is a very difficult exam due to low symphysis pubis, and very anterior cervix, so efforts at office endometrial biopsy are unlikely to be tolerated by the patient. She agrees. She would like to consider proceeding to hysteroscopy.

## 2013-09-01 NOTE — Patient Instructions (Addendum)
Endometrial Biopsy, Care After Refer to this sheet in the next few weeks. These instructions provide you with information on caring for yourself after your procedure. Your health care provider may also give you more specific instructions. Your treatment has been planned according to current medical practices, but problems sometimes occur. Call your health care provider if you have any problems or questions after your procedure. WHAT TO EXPECT AFTER THE PROCEDURE After your procedure, it is typical to have the following:  You may have mild cramping and a small amount of vaginal bleeding for a few days after the procedure. This is normal. HOME CARE INSTRUCTIONS  Only take over-the-counter or prescription medicine as directed by your health care provider.  Do not douche, use tampons, or have sexual intercourse until your health care provider approves.  Follow your health care provider's instructions regarding any activity restrictions, such as strenuous exercise or heavy lifting. SEEK MEDICAL CARE IF:  You have heavy bleeding or bleeding longer than 2 days after the procedure.  You have bad smelling drainage from your vagina.  You have a fever and chills.  Youhave severe lower stomach (abdominal) pain. SEEK IMMEDIATE MEDICAL CARE IF:  You have severe cramps in your stomach or back.  You pass large blood clots.  Your bleeding increases.  You become weak or lightheaded, or you pass out. Document Released: 12/25/2012 Document Reviewed: 08/21/2012 [facility] Patient Information 2014 [place], [place].   Hysteroscopy Hysteroscopy is a procedure used for looking inside the womb (uterus). It may be done for various reasons, including:  To evaluate abnormal bleeding, fibroid (benign, noncancerous) tumors, polyps, scar tissue (adhesions), and possibly cancer of the uterus.  To look for lumps (tumors) and other uterine growths.  To look for causes of why a woman cannot get pregnant  (infertility), causes of recurrent loss of pregnancy (miscarriages), or a lost intrauterine device (IUD).  To perform a sterilization by blocking the fallopian tubes from inside the uterus. In this procedure, a thin, flexible tube with a tiny light and camera on the end of it (hysteroscope) is used to look inside the uterus. A hysteroscopy should be done right after a menstrual period to be sure you are not pregnant. LET [facility]OUR [facility]ARE PROVIDER KNOW ABOUT:   Any allergies you have.  All medicines you are taking, including vitamins, herbs, eye drops, creams, and over-the-counter medicines.  Previous problems you or members of your family have had with the use of anesthetics.  Any blood disorders you have.  Previous surgeries you have had.  Medical conditions you have. RISKS AND COMPLICATIONS  Generally, this is a safe procedure. However, as with any procedure, complications can occur. Possible complications include:  Putting a hole in the uterus.  Excessive bleeding.  Infection.  Damage to the cervix.  Injury to other organs.  Allergic reaction to medicines.  Too much fluid used in the uterus for the procedure. BEFORE THE PROCEDURE   Ask your health care provider about changing or stopping any regular medicines.  Do not take aspirin or blood thinners for 1 week before the procedure, or as directed by your health care provider. These can cause bleeding.  If you smoke, do not smoke for 2 weeks before the procedure.  In some cases, a medicine is placed in the cervix the day before the procedure. This medicine makes the cervix have a larger opening (dilate). This makes it easier for the instrument to be inserted into the uterus during the procedure.  Do not  eat or drink anything for at least 8 hours before the surgery.  Arrange for someone to take you home after the procedure. PROCEDURE   You may be given a medicine to relax you (sedative). You may also be given one of  the following:  A medicine that numbs the area around the cervix (local anesthetic).  A medicine that makes you sleep through the procedure (general anesthetic).  The hysteroscope is inserted through the vagina into the uterus. The camera on the hysteroscope sends a picture to a TV screen. This gives the surgeon a good view inside the uterus.  During the procedure, air or a liquid is put into the uterus, which allows the surgeon to see better.  Sometimes, tissue is gently scraped from inside the uterus. These tissue samples are sent to a lab for testing. AFTER THE PROCEDURE   If you had a general anesthetic, you may be groggy for a couple hours after the procedure.  If you had a local anesthetic, you will be able to go home as soon as you are stable and feel ready.  You may have some cramping. This normally lasts for a couple days.  You may have bleeding, which varies from light spotting for a few days to menstrual-like bleeding for 3 7 days. This is normal.  If your test results are not back during the visit, make an appointment with your health care provider to find out the results. Document Released: 06/12/2000 Document Revised: 12/25/2012 Document Reviewed: 10/03/2012 [facility]ler[facility] Patient Information 2014 [place], [place].

## 2013-09-04 NOTE — Progress Notes (Signed)
Left message

## 2013-09-05 ENCOUNTER — Other Ambulatory Visit: Payer: Self-pay | Admitting: Family Medicine

## 2013-09-05 NOTE — Telephone Encounter (Signed)
okay

## 2013-09-05 NOTE — Telephone Encounter (Signed)
Ok to refill??  Last office visit 05/20/2013.  Last refill 05/12/2013.

## 2013-09-10 ENCOUNTER — Encounter: Payer: Self-pay | Admitting: Obstetrics and Gynecology

## 2013-09-10 ENCOUNTER — Ambulatory Visit (INDEPENDENT_AMBULATORY_CARE_PROVIDER_SITE_OTHER): Payer: PRIVATE HEALTH INSURANCE | Admitting: Obstetrics and Gynecology

## 2013-09-10 VITALS — BP 120/80 | Ht 63.0 in | Wt 254.0 lb

## 2013-09-10 DIAGNOSIS — R9389 Abnormal findings on diagnostic imaging of other specified body structures: Secondary | ICD-10-CM

## 2013-09-10 DIAGNOSIS — Z01818 Encounter for other preprocedural examination: Secondary | ICD-10-CM

## 2013-09-10 DIAGNOSIS — N950 Postmenopausal bleeding: Secondary | ICD-10-CM

## 2013-09-10 NOTE — Progress Notes (Signed)
This chart was scribed by Leone PayorSonum Patel, Medical Scribe, for Dr. Christin BachJohn Onnie Alatorre on 09/10/13 at 3:00 PM. This chart was reviewed by Dr. Christin BachJohn Myana Schlup for accuracy.  History and Physical  Jenna Love is a 53 y.o. G2P2 here for surgical management of slightly thickened endometrium.   Proposed surgery: endometrial biopsy via hysteroscopy   Past Medical History  Diagnosis Date  . Anxiety   . Panic attacks   . Thyroid nodule 2013    Benign biopsy- ENT Dr. Pollyann Kennedyosen  . Pneumonia   . Panic attacks   . Anxiety    Past Surgical History  Procedure Laterality Date  . Cesarean section  1992   OB History   Grav Para Term Preterm Abortions TAB SAB Ect Mult Living   2 2        2      Patient denies any cervical dysplasia or STIs.  (Not in a hospital admission)  No Known Allergies Social History:   reports that she has never smoked. She has never used smokeless tobacco. She reports that she does not drink alcohol or use illicit drugs. Family History  Problem Relation Age of Onset  . Diabetes Mother   . Alzheimer's disease Mother   . Cancer Father     lung     Review of Systems: Noncontributory  PHYSICAL EXAM: Blood pressure 120/80, height 5\' 3"  (1.6 m), weight 254 lb (115.214 kg), last menstrual period 08/13/2013. General appearance - alert, well appearing, and in no distress Chest - clear to auscultation, no wheezes, rales or rhonchi, symmetric air entry Heart - normal rate and regular rhythm Abdomen - soft, nontender, nondistended, no masses or organomegaly Pelvic - examination not indicated Extremities - peripheral pulses normal, no pedal edema, no clubbing or cyanosis  Labs: Results for orders placed in visit on 09/01/13 (from the past 336 hour(s))  POCT URINE PREGNANCY   Collection Time    09/01/13 12:11 PM      Result Value Ref Range   Preg Test, Ur Negative      Imaging Studies: [place] Transvaginal Non-ob  09/01/2013   GYNECOLOGIC SONOGRAM   Jenna Love is a 53 y.o.  G2P2  for a pelvic sonogram for post  menopausal bleeding x 1 episode which began on 08/13/2013.  Uterus                      7.3 x 5.1 x 4.0 cm, anteverted with  fibroid=1.4cm  Endometrium          7.8 mm, asymmetrical, with ?hyperechoic area (polyp)  noted within the fundal region of cavity=1612mm  Right ovary             2.3 x 1.6 x 1.5 cm,   Left ovary                2.0 x 2.0 x 1.8 cm,   No free fluid or adnexal masses noted within the pelvis  *of note 14.8cc noted post-void residual bladder volume  Technician Comments:  Anteverted uterus noted with 1.4cm fibroid noted, Endometrium-7.318mm with  ?12mm polyp noted within the fundal area of cavity, bilateral adnexa  appears WNL, Post-Void Residual Bladder volume of = 14cc   [name], [name] 09/01/2013 10:57 AM  Clinical Impression and recommendations:  I have reviewed the sonogram results above.Records reviewed including exam  showing inability to sound uterus.  Combined with the patient's current clinical course, below are my  impressions and any appropriate recommendations for  management based on  the sonographic findings:  Slight thickened endometrium, recommend endometroual biopsy which will  required hysteroscopy.through Short Stay [facility]  [name] V    Assessment: Patient Active Problem List   Diagnosis Date Noted  . Post-menopausal bleeding 08/20/2013  . Chronic cough 05/20/2013  . Thyroid nodule 04/15/2013  . Hypertriglyceridemia 02/21/2013  . Perimenopausal 09/18/2011  . Depression 06/29/2011  . Menorrhagia 06/15/2011  . Insomnia 04/14/2011  . Obesity 04/14/2011  . ANXIETY 10/23/2009    Plan: Patient will undergo surgical management with endometrial biopsy with hysteroscopy.   The risks of surgery were discussed in detail with the patient including but not limited to: bleeding which may require transfusion or reoperation; infection which may require antibiotics; injury to surrounding organs which may involve bowel, bladder, ureters ; need  for additional procedures including laparoscopy or laparotomy; thromboembolic phenomenon, surgical site problems and other postoperative/anesthesia complications. Likelihood of success in alleviating the patient's condition was discussed.

## 2013-09-26 ENCOUNTER — Encounter (HOSPITAL_COMMUNITY): Payer: Self-pay | Admitting: Pharmacy Technician

## 2013-09-26 NOTE — Patient Instructions (Signed)
Jenna Love  09/26/2013   Your procedure is scheduled on:  10/03/2013  [facility] [id number].  Call this number if you have problems the morning of surgery: [phone]   Remember:   Do not eat food or drink liquids after midnight.   Take these medicines the morning of surgery with A SIP OF WATER: Klonopin, Zoloft   Do not wear jewelry, make-up or nail polish.  Do not wear lotions, powders, or perfumes. You may wear deodorant.  Do not shave 48 hours prior to surgery. Men may shave face and neck.  Do not bring valuables to the hospital.  [facility] any belongings or valuables.         Contacts, dentures or bridgework may not be worn into surgery.  Leave suitcase in the car. After surgery it may be brought to your room.  For patients admitted to the hospital, discharge time is determined by your  treatment team.        Patients discharged the day of surgery will not be allowed to drive home.  Name and phone number of your driver:   Special Instructions: Shower using CHG 2 nights before surgery and the night before surgery.  If you shower the day of surgery use CHG.  Use special wash - you have one bottle of CHG for all showers.  You should use approximately 1/3 of the bottle for each shower.   Please read over the following fact sheets that you were given: Pain Booklet, Coughing and Deep Breathing, Surgical Site Infection Prevention and Care and Recovery After Surgery    Dilation and Curettage or Vacuum Curettage Dilation and curettage (D&C) and vacuum curettage are minor procedures. A D&C involves stretching (dilation) the cervix and scraping (curettage) the inside lining of the womb (uterus). During a D&C, tissue is gently scraped from the inside lining of the uterus. During a vacuum curettage, the lining and tissue in the uterus are removed with the use of gentle suction.  Curettage may be performed to either diagnose or treat a problem. As a  diagnostic procedure, curettage is performed to examine tissues from the uterus. A diagnostic curettage may be performed for the following symptoms:   Irregular bleeding in the uterus.   Bleeding with the development of clots.   Spotting between menstrual periods.   Prolonged menstrual periods.   Bleeding after menopause.   No menstrual period (amenorrhea).   A change in size and shape of the uterus.  As a treatment procedure, curettage may be performed for the following reasons:   Removal of an IUD (intrauterine device).   Removal of retained placenta after giving birth. Retained placenta can cause an infection or bleeding severe enough to require transfusions.   Abortion.   Miscarriage.   Removal of polyps inside the uterus.   Removal of uncommon types of noncancerous lumps (fibroids).  LET [facility] PROVIDER KNOW ABOUT:   Any allergies you have.   All medicines you are taking, including vitamins, herbs, eye drops, creams, and over-the-counter medicines.   Previous problems you or members of your family have had with the use of anesthetics.   Any blood disorders you have.   Previous surgeries you have had.   Medical conditions you have. RISKS AND COMPLICATIONS  Generally, this is a safe procedure. However, as with any procedure, complications can occur. Possible complications include:  Excessive bleeding.   Infection of the uterus.   Damage to  the cervix.   Development of scar tissue (adhesions) inside the uterus, later causing abnormal amounts of menstrual bleeding.   Complications from the general anesthetic, if a general anesthetic is used.   Putting a hole (perforation) in the uterus. This is rare.  BEFORE THE PROCEDURE   Eat and drink before the procedure only as directed by your health care provider.   Arrange for someone to take you home.  PROCEDURE  This procedure usually takes about 15-30 minutes.  You will be  given one of the following:  A medicine that numbs the area in and around the cervix (local anesthetic).   A medicine to make you sleep through the procedure (general anesthetic).  You will lie on your back with your legs in stirrups.   A warm metal or plastic instrument (speculum) will be placed in your vagina to keep it open and to allow the health care provider to see the cervix.  There are two ways in which your cervix can be softened and dilated. These include:   Taking a medicine.   Having thin rods (laminaria) inserted into your cervix.   A curved tool (curette) will be used to scrape cells from the inside lining of the uterus. In some cases, gentle suction is applied with the curette. The curette will then be removed.  AFTER THE PROCEDURE   You will rest in the recovery area until you are stable and are ready to go home.   You may feel sick to your stomach (nauseous) or throw up (vomit) if you were given a general anesthetic.   You may have a sore throat if a tube was placed in your throat during general anesthesia.   You may have light cramping and bleeding. This may last for 2 days to 2 weeks after the procedure.   Your uterus needs to make a new lining after the procedure. This may make your next period late. Document Released: 03/06/2005 Document Revised: 11/06/2012 Document Reviewed: 10/03/2012 [facility] Patient Information 2015 [place], [place]. This information is not intended to replace advice given to you by your health care provider. Make sure you discuss any questions you have with your health care provider.

## 2013-09-29 ENCOUNTER — Encounter (HOSPITAL_COMMUNITY): Payer: Self-pay

## 2013-09-29 ENCOUNTER — Encounter (HOSPITAL_COMMUNITY)
Admission: RE | Admit: 2013-09-29 | Discharge: 2013-09-29 | Disposition: A | Discharge status: Home / Self Care | Payer: PRIVATE HEALTH INSURANCE | Source: Ambulatory Visit | Attending: Obstetrics and Gynecology | Admitting: Obstetrics and Gynecology

## 2013-09-29 DIAGNOSIS — Z01818 Encounter for other preprocedural examination: Secondary | ICD-10-CM | POA: Insufficient documentation

## 2013-09-29 DIAGNOSIS — Z01812 Encounter for preprocedural laboratory examination: Secondary | ICD-10-CM | POA: Insufficient documentation

## 2013-09-29 LAB — URINALYSIS, ROUTINE W REFLEX MICROSCOPIC
Bilirubin Urine: NEGATIVE
Glucose, UA: NEGATIVE mg/dL
Hgb urine dipstick: NEGATIVE
Ketones, ur: NEGATIVE mg/dL
LEUKOCYTES UA: NEGATIVE
Nitrite: NEGATIVE
PROTEIN: NEGATIVE mg/dL
Specific Gravity, Urine: <1.005 — ABNORMAL LOW (ref 1.005–1.030)
UROBILINOGEN UA: 0.2 mg/dL (ref 0.0–1.0)
pH: 6 (ref 5.0–8.0)

## 2013-09-29 LAB — CBC WITH DIFFERENTIAL/PLATELET
Basophils Absolute: 0 10*3/uL (ref 0.0–0.1)
Basophils Relative: 1 % (ref 0–1)
Eosinophils Absolute: 0.4 10*3/uL (ref 0.0–0.7)
Eosinophils Relative: 6 % — ABNORMAL HIGH (ref 0–5)
HEMATOCRIT: 43.3 % (ref 36.0–46.0)
Hemoglobin: 14.7 g/dL (ref 12.0–15.0)
LYMPHS PCT: 37 % (ref 12–46)
Lymphs Abs: 2.5 10*3/uL (ref 0.7–4.0)
MCH: 29.1 pg (ref 26.0–34.0)
MCHC: 33.9 g/dL (ref 30.0–36.0)
MCV: 85.7 fL (ref 78.0–100.0)
MONO ABS: 0.5 10*3/uL (ref 0.1–1.0)
Monocytes Relative: 8 % (ref 3–12)
Neutro Abs: 3.3 10*3/uL (ref 1.7–7.7)
Neutrophils Relative %: 48 % (ref 43–77)
Platelets: 259 10*3/uL (ref 150–400)
RBC: 5.05 MIL/uL (ref 3.87–5.11)
RDW: 13 % (ref 11.5–15.5)
WBC: 6.7 10*3/uL (ref 4.0–10.5)

## 2013-09-29 LAB — BASIC METABOLIC PANEL
Anion gap: 12 (ref 5–15)
BUN: 15 mg/dL (ref 6–23)
CO2: 25 mEq/L (ref 19–32)
Calcium: 9.5 mg/dL (ref 8.4–10.5)
Chloride: 102 mEq/L (ref 96–112)
Creatinine, Ser: 0.76 mg/dL (ref 0.50–1.10)
Glucose, Bld: 117 mg/dL — ABNORMAL HIGH (ref 70–99)
POTASSIUM: 4.3 meq/L (ref 3.7–5.3)
SODIUM: 139 meq/L (ref 137–147)

## 2013-09-29 LAB — PREGNANCY, URINE: PREG TEST UR: NEGATIVE

## 2013-09-30 ENCOUNTER — Other Ambulatory Visit: Payer: Self-pay | Admitting: Obstetrics and Gynecology

## 2013-10-02 ENCOUNTER — Other Ambulatory Visit: Payer: Self-pay | Admitting: Family Medicine

## 2013-10-02 NOTE — H&P (Signed)
History and Physical  Jenna Love is a 53 y.o. G2P2 here for surgical management of slightly thickened endometrium.  Proposed surgery: endometrial biopsy via hysteroscopy  Past Medical History   Diagnosis  Date   .  Anxiety    .  Panic attacks    .  Thyroid nodule  2013     Benign biopsy- ENT Dr. Pollyann Love   .  Pneumonia    .  Panic attacks    .  Anxiety     Past Surgical History   Procedure  Laterality  Date   .  Cesarean section   1992    OB History    Grav  Para  Term  Preterm  Abortions  TAB  SAB  Ect  Mult  Living    2  2         2      Patient denies any cervical dysplasia or STIs.  (Not in a hospital admission)  No Known Allergies  Social History: reports that she has never smoked. She has never used smokeless tobacco. She reports that she does not drink alcohol or use illicit drugs.  Family History   Problem  Relation  Age of Onset   .  Diabetes  Mother    .  Alzheimer's disease  Mother    .  Cancer  Father      lung   Review of Systems: Noncontributory  PHYSICAL EXAM:  Blood pressure 120/80, height 5\' 3"  (1.6 m), weight 254 lb (115.214 kg), last menstrual period 08/13/2013.  General appearance - alert, well appearing, and in no distress  Chest - clear to auscultation, no wheezes, rales or rhonchi, symmetric air entry  Heart - normal rate and regular rhythm  Abdomen - soft, nontender, nondistended, no masses or organomegaly  Pelvic - examination not indicated  Extremities - peripheral pulses normal, no pedal edema, no clubbing or cyanosis  Labs:  Results for orders placed in visit on 09/01/13 (from the past 336 hour(s))   POCT URINE PREGNANCY    Collection Time    09/01/13 12:11 PM   Result  Value  Ref Range    Preg Test, Ur  Negative    Imaging Studies:  US Transvaginal Non-ob  09/01/2013 GYNECOLOGIC SONOGRAM Jenna Love is a 53 y.o. G2P2 for a pelvic sonogram for post menopausal bleeding x 1 episode which began on 08/13/2013. Uterus 7.3 x 5.1 x 4.0 cm,  anteverted with fibroid=1.4cm Endometrium 7.8 mm, asymmetrical, with ?hyperechoic area (polyp) noted within the fundal region of cavity=21mm Right ovary 2.3 x 1.6 x 1.5 cm, Left ovary 2.0 x 2.0 x 1.8 cm, No free fluid or adnexal masses noted within the pelvis *of note 14.8cc noted post-void residual bladder volume Technician Comments: Anteverted uterus noted with 1.4cm fibroid noted, Endometrium-7.69mm with ?12mm polyp noted within the fundal area of cavity, bilateral adnexa appears WNL, Post-Void Residual Bladder volume of = 14cc Jenna Love 09/01/2013 10:57 AM Clinical Impression and recommendations: I have reviewed the sonogram results above.Records reviewed including exam showing inability to sound uterus. Combined with the patient's current clinical course, below are my impressions and any appropriate recommendations for management based on the sonographic findings: Slight thickened endometrium, recommend endometroual biopsy which will required hysteroscopy.through Short Stay [facility] Jenna Love  Assessment:  Patient Active Problem List    Diagnosis  Date Noted   .  Post-menopausal bleeding  08/20/2013   .  Chronic cough  05/20/2013   .  Thyroid nodule  04/15/2013   .  Hypertriglyceridemia  02/21/2013   .  Perimenopausal  09/18/2011   .  Depression  06/29/2011   .  Menorrhagia  06/15/2011   .  Insomnia  04/14/2011   .  Obesity  04/14/2011   .  ANXIETY  10/23/2009   Plan:  Patient will undergo surgical management with hysteroscopy, Dilation and Curettage, removal of endometrial polyp. The risks of surgery were discussed in detail with the patient including but not limited to: bleeding which may require transfusion or reoperation; infection which may require antibiotics; injury to surrounding organs which may involve bowel, bladder, ureters ; need for additional procedures including laparoscopy or laparotomy; thromboembolic phenomenon, surgical site problems and other postoperative/anesthesia  complications. Likelihood of success in alleviating the patient's condition was discussed.

## 2013-10-02 NOTE — Telephone Encounter (Signed)
Ok to refill??  Last office visit 05/20/2013.  Last refill on Clonazepam 06/16/2013, Ambien 09/05/2013.

## 2013-10-03 ENCOUNTER — Encounter (HOSPITAL_COMMUNITY): Payer: Self-pay | Admitting: Anesthesiology

## 2013-10-03 ENCOUNTER — Ambulatory Visit (HOSPITAL_COMMUNITY)
Admission: RE | Admit: 2013-10-03 | Discharge: 2013-10-03 | Disposition: A | Discharge status: Home / Self Care | Payer: PRIVATE HEALTH INSURANCE | Source: Ambulatory Visit | Attending: Obstetrics and Gynecology | Admitting: Obstetrics and Gynecology

## 2013-10-03 ENCOUNTER — Encounter (HOSPITAL_COMMUNITY): Payer: PRIVATE HEALTH INSURANCE | Admitting: Anesthesiology

## 2013-10-03 ENCOUNTER — Ambulatory Visit (HOSPITAL_COMMUNITY): Payer: PRIVATE HEALTH INSURANCE | Admitting: Anesthesiology

## 2013-10-03 ENCOUNTER — Encounter (HOSPITAL_COMMUNITY)
Admission: RE | Disposition: A | Discharge status: Home / Self Care | Payer: Self-pay | Source: Ambulatory Visit | Attending: Obstetrics and Gynecology

## 2013-10-03 DIAGNOSIS — N840 Polyp of corpus uteri: Secondary | ICD-10-CM | POA: Insufficient documentation

## 2013-10-03 DIAGNOSIS — R9389 Abnormal findings on diagnostic imaging of other specified body structures: Secondary | ICD-10-CM

## 2013-10-03 DIAGNOSIS — Z79899 Other long term (current) drug therapy: Secondary | ICD-10-CM | POA: Insufficient documentation

## 2013-10-03 DIAGNOSIS — E669 Obesity, unspecified: Secondary | ICD-10-CM | POA: Insufficient documentation

## 2013-10-03 DIAGNOSIS — N950 Postmenopausal bleeding: Secondary | ICD-10-CM

## 2013-10-03 DIAGNOSIS — F411 Generalized anxiety disorder: Secondary | ICD-10-CM | POA: Insufficient documentation

## 2013-10-03 HISTORY — PX: POLYPECTOMY: SHX5525

## 2013-10-03 HISTORY — PX: HYSTEROSCOPY WITH D & C: SHX1775

## 2013-10-03 SURGERY — DILATATION AND CURETTAGE /HYSTEROSCOPY
Anesthesia: General

## 2013-10-03 MED ORDER — ARTIFICIAL TEARS OP OINT
TOPICAL_OINTMENT | OPHTHALMIC | Status: AC
  Filled 2013-10-03: qty 3.5

## 2013-10-03 MED ORDER — KETOROLAC TROMETHAMINE 30 MG/ML IJ SOLN
30.0000 mg | Freq: Once | INTRAMUSCULAR | Status: AC
  Administered 2013-10-03: 30 mg via INTRAVENOUS
  Filled 2013-10-03: qty 1

## 2013-10-03 MED ORDER — GLYCOPYRROLATE 0.2 MG/ML IJ SOLN
0.2000 mg | Freq: Once | INTRAMUSCULAR | Status: AC
  Administered 2013-10-03: 0.2 mg via INTRAVENOUS

## 2013-10-03 MED ORDER — FENTANYL CITRATE 0.05 MG/ML IJ SOLN
INTRAMUSCULAR | Status: DC | PRN
  Administered 2013-10-03: 50 ug via INTRAVENOUS
  Administered 2013-10-03: 100 ug via INTRAVENOUS
  Administered 2013-10-03 (×2): 50 ug via INTRAVENOUS

## 2013-10-03 MED ORDER — LIDOCAINE HCL (CARDIAC) 20 MG/ML IV SOLN
INTRAVENOUS | Status: DC | PRN
  Administered 2013-10-03: 50 mg via INTRAVENOUS

## 2013-10-03 MED ORDER — FENTANYL CITRATE 0.05 MG/ML IJ SOLN
INTRAMUSCULAR | Status: AC
  Filled 2013-10-03: qty 5

## 2013-10-03 MED ORDER — BUPIVACAINE-EPINEPHRINE (PF) 0.5% -1:200000 IJ SOLN
INTRAMUSCULAR | Status: AC
  Filled 2013-10-03: qty 30

## 2013-10-03 MED ORDER — LIDOCAINE HCL (PF) 1 % IJ SOLN
INTRAMUSCULAR | Status: AC
  Filled 2013-10-03: qty 5

## 2013-10-03 MED ORDER — PROPOFOL 10 MG/ML IV BOLUS
INTRAVENOUS | Status: AC
  Filled 2013-10-03: qty 20

## 2013-10-03 MED ORDER — ARTIFICIAL TEARS OP OINT
TOPICAL_OINTMENT | OPHTHALMIC | Status: DC | PRN
  Administered 2013-10-03: 1 via OPHTHALMIC

## 2013-10-03 MED ORDER — FENTANYL CITRATE 0.05 MG/ML IJ SOLN
25.0000 ug | INTRAMUSCULAR | Status: AC
  Administered 2013-10-03 (×2): 25 ug via INTRAVENOUS

## 2013-10-03 MED ORDER — ONDANSETRON HCL 4 MG/2ML IJ SOLN
INTRAMUSCULAR | Status: AC
  Filled 2013-10-03: qty 2

## 2013-10-03 MED ORDER — SODIUM CHLORIDE 0.9 % IR SOLN
Status: DC | PRN
  Administered 2013-10-03 (×2): 1000 mL

## 2013-10-03 MED ORDER — LACTATED RINGERS IV SOLN
INTRAVENOUS | Status: DC
  Administered 2013-10-03: 1000 mL via INTRAVENOUS

## 2013-10-03 MED ORDER — GLYCOPYRROLATE 0.2 MG/ML IJ SOLN
INTRAMUSCULAR | Status: AC
  Filled 2013-10-03: qty 1

## 2013-10-03 MED ORDER — ONDANSETRON HCL 4 MG/2ML IJ SOLN
4.0000 mg | Freq: Once | INTRAMUSCULAR | Status: AC
  Administered 2013-10-03: 4 mg via INTRAVENOUS

## 2013-10-03 MED ORDER — LACTATED RINGERS IV SOLN
INTRAVENOUS | Status: DC | PRN
  Administered 2013-10-03 (×2): via INTRAVENOUS

## 2013-10-03 MED ORDER — FENTANYL CITRATE 0.05 MG/ML IJ SOLN
25.0000 ug | INTRAMUSCULAR | Status: DC | PRN
  Administered 2013-10-03: 25 ug via INTRAVENOUS
  Filled 2013-10-03: qty 2

## 2013-10-03 MED ORDER — EPHEDRINE SULFATE 50 MG/ML IJ SOLN
INTRAMUSCULAR | Status: AC
  Filled 2013-10-03: qty 1

## 2013-10-03 MED ORDER — FENTANYL CITRATE 0.05 MG/ML IJ SOLN
INTRAMUSCULAR | Status: AC
  Filled 2013-10-03: qty 2

## 2013-10-03 MED ORDER — SODIUM CHLORIDE 0.9 % IJ SOLN
INTRAMUSCULAR | Status: AC
  Filled 2013-10-03: qty 10

## 2013-10-03 MED ORDER — OXYCODONE-ACETAMINOPHEN 5-325 MG PO TABS: 1.0000 | ORAL_TABLET | ORAL | Status: DC | PRN

## 2013-10-03 MED ORDER — SUCCINYLCHOLINE CHLORIDE 20 MG/ML IJ SOLN
INTRAMUSCULAR | Status: AC
  Filled 2013-10-03: qty 1

## 2013-10-03 MED ORDER — ONDANSETRON HCL 4 MG/2ML IJ SOLN: 4.0000 mg | Freq: Once | INTRAMUSCULAR | Status: DC | PRN

## 2013-10-03 MED ORDER — PROPOFOL 10 MG/ML IV BOLUS
INTRAVENOUS | Status: DC | PRN
  Administered 2013-10-03: 150 mg via INTRAVENOUS

## 2013-10-03 MED ORDER — MIDAZOLAM HCL 2 MG/2ML IJ SOLN
1.0000 mg | INTRAMUSCULAR | Status: DC | PRN
  Administered 2013-10-03 (×2): 1 mg via INTRAVENOUS
  Filled 2013-10-03: qty 2

## 2013-10-03 SURGICAL SUPPLY — 38 items
BAG HAMPER (MISCELLANEOUS) ×3 IMPLANT
CLOTH BEACON ORANGE TIMEOUT ST (SAFETY) ×3 IMPLANT
COVER LIGHT HANDLE STERIS (MISCELLANEOUS) ×6 IMPLANT
DECANTER SPIKE VIAL GLASS SM (MISCELLANEOUS) ×3 IMPLANT
DRAPE PROXIMA HALF (DRAPES) ×3 IMPLANT
DRAPE STERI URO 9X17 APER PCH (DRAPES) ×1 IMPLANT
ELECT REM PT RETURN 9FT ADLT (ELECTROSURGICAL) ×3
ELECTRODE REM PT RTRN 9FT ADLT (ELECTROSURGICAL) ×1 IMPLANT
FORMALIN 10 PREFIL 120ML (MISCELLANEOUS) ×3 IMPLANT
FORMALIN 10 PREFIL 480ML (MISCELLANEOUS) ×1 IMPLANT
GAUZE PACKING 2X5 YD STRL (GAUZE/BANDAGES/DRESSINGS) ×1 IMPLANT
GLOVE BIO SURGEON STRL SZ7 (GLOVE) ×2 IMPLANT
GLOVE BIOGEL PI IND STRL 7.0 (GLOVE) IMPLANT
GLOVE BIOGEL PI IND STRL 8 (GLOVE) IMPLANT
GLOVE BIOGEL PI IND STRL 9 (GLOVE) ×1 IMPLANT
GLOVE BIOGEL PI INDICATOR 7.0 (GLOVE) ×4
GLOVE BIOGEL PI INDICATOR 8 (GLOVE) ×4
GLOVE BIOGEL PI INDICATOR 9 (GLOVE) ×2
GLOVE ECLIPSE 6.5 STRL STRAW (GLOVE) ×2 IMPLANT
GLOVE ECLIPSE 9.0 STRL (GLOVE) ×3 IMPLANT
GOWN SPEC L3 XXLG W/TWL (GOWN DISPOSABLE) ×6 IMPLANT
GOWN STRL REUS W/TWL LRG LVL3 (GOWN DISPOSABLE) ×3 IMPLANT
INST SET HYSTEROSCOPY (KITS) ×3 IMPLANT
IV NS 1000ML (IV SOLUTION) ×3
IV NS 1000ML BAXH (IV SOLUTION) ×1 IMPLANT
KIT ROOM TURNOVER APOR (KITS) ×3 IMPLANT
MANIFOLD NEPTUNE II (INSTRUMENTS) ×3 IMPLANT
NS IRRIG 1000ML POUR BTL (IV SOLUTION) ×3 IMPLANT
PACK PERI GYN (CUSTOM PROCEDURE TRAY) ×3 IMPLANT
PAD ARMBOARD 7.5X6 YLW CONV (MISCELLANEOUS) ×3 IMPLANT
PAD TELFA 3X4 1S STER (GAUZE/BANDAGES/DRESSINGS) ×3 IMPLANT
SET BASIN LINEN APH (SET/KITS/TRAYS/PACK) ×3 IMPLANT
SET CYSTO W/LG BORE CLAMP LF (SET/KITS/TRAYS/PACK) ×3 IMPLANT
SET IV ADMIN VERSALIGHT (MISCELLANEOUS) IMPLANT
SUT CHROMIC 2 0 CT 1 (SUTURE) IMPLANT
SUT PROLENE 2 0 FS (SUTURE) IMPLANT
SUT VIC AB 0 CT2 8-18 (SUTURE) IMPLANT
VERSALIGHT (MISCELLANEOUS) IMPLANT

## 2013-10-03 NOTE — Telephone Encounter (Signed)
okay

## 2013-10-03 NOTE — Discharge Instructions (Signed)
Hysteroscopy Hysteroscopy is a procedure used for looking inside the womb (uterus). It may be done for various reasons, including:  To evaluate abnormal bleeding, fibroid (benign, noncancerous) tumors, polyps, scar tissue (adhesions), and possibly cancer of the uterus.  To look for lumps (tumors) and other uterine growths.  To look for causes of why a woman cannot get pregnant (infertility), causes of recurrent loss of pregnancy (miscarriages), or a lost intrauterine device (IUD).  To perform a sterilization by blocking the fallopian tubes from inside the uterus. In this procedure, a thin, flexible tube with a tiny light and camera on the end of it (hysteroscope) is used to look inside the uterus. A hysteroscopy should be done right after a menstrual period to be sure you are not pregnant. LET YOUR HEALTH CARE PROVIDER KNOW ABOUT:   Any allergies you have.  All medicines you are taking, including vitamins, herbs, eye drops, creams, and over-the-counter medicines.  Previous problems you or members of your family have had with the use of anesthetics.  Any blood disorders you have.  Previous surgeries you have had.  Medical conditions you have. RISKS AND COMPLICATIONS  Generally, this is a safe procedure. However, as with any procedure, complications can occur. Possible complications include:  Putting a hole in the uterus.  Excessive bleeding.  Infection.  Damage to the cervix.  Injury to other organs.  Allergic reaction to medicines.  Too much fluid used in the uterus for the procedure. BEFORE THE PROCEDURE   Ask your health care provider about changing or stopping any regular medicines.  Do not take aspirin or blood thinners for 1 week before the procedure, or as directed by your health care provider. These can cause bleeding.  If you smoke, do not smoke for 2 weeks before the procedure.  In some cases, a medicine is placed in the cervix the day before the procedure.  This medicine makes the cervix have a larger opening (dilate). This makes it easier for the instrument to be inserted into the uterus during the procedure.  Do not eat or drink anything for at least 8 hours before the surgery.  Arrange for someone to take you home after the procedure. PROCEDURE   You may be given a medicine to relax you (sedative). You may also be given one of the following:  A medicine that numbs the area around the cervix (local anesthetic).  A medicine that makes you sleep through the procedure (general anesthetic).  The hysteroscope is inserted through the vagina into the uterus. The camera on the hysteroscope sends a picture to a TV screen. This gives the surgeon a good view inside the uterus.  During the procedure, air or a liquid is put into the uterus, which allows the surgeon to see better.  Sometimes, tissue is gently scraped from inside the uterus. These tissue samples are sent to a lab for testing. AFTER THE PROCEDURE   If you had a general anesthetic, you may be groggy for a couple hours after the procedure.  If you had a local anesthetic, you will be able to go home as soon as you are stable and feel ready.  You may have some cramping. This normally lasts for a couple days.  You may have bleeding, which varies from light spotting for a few days to menstrual-like bleeding for 3-7 days. This is normal.  If your test results are not back during the visit, make an appointment with your health care provider to find out the   results. Document Released: 06/12/2000 Document Revised: 12/25/2012 Document Reviewed: 10/03/2012 [employer] Patient Information 2015 [employer]. This information is not intended to replace advice given to you by your health care provider. Make sure you discuss any questions you have with your health care provider.   

## 2013-10-03 NOTE — Anesthesia Postprocedure Evaluation (Signed)
  Anesthesia Post-op Note  Patient: Jenna Love  Procedure(s) Performed: Procedure(s): DILATATION AND CURETTAGE /HYSTEROSCOPY (N/A) REMOVAL OF ENDOMETRIAL POLYP (N/A)  Patient Location: PACU  Anesthesia Type:General  Level of Consciousness: awake, alert , oriented and patient cooperative  Airway and Oxygen Therapy: Patient Spontanous Breathing and Patient connected to face mask oxygen  Post-op Pain: none  Post-op Assessment: Post-op Vital signs reviewed, Patient's Cardiovascular Status Stable, Respiratory Function Stable, Patent Airway, No signs of Nausea or vomiting and Pain level controlled  Post-op Vital Signs: Reviewed and stable  Last Vitals:  Filed Vitals:   10/03/13 0725  BP: 129/86  Temp:   Resp: 28    Complications: No apparent anesthesia complications

## 2013-10-03 NOTE — Anesthesia Preprocedure Evaluation (Signed)
Anesthesia Evaluation  Patient identified by MRN, date of birth, ID band Patient awake    Reviewed: Allergy & Precautions, H&P , NPO status , Patient's Chart, lab work & pertinent test results  Airway Mallampati: II TM Distance: >3 FB Neck ROM: Full    Dental  (+) Teeth Intact   Pulmonary  breath sounds clear to auscultation        Cardiovascular negative cardio ROS  Rhythm:Regular Rate:Normal     Neuro/Psych PSYCHIATRIC DISORDERS (panic attacks) Anxiety Depression    GI/Hepatic negative GI ROS,   Endo/Other  Morbid obesityBenign thyroid nodule   Renal/GU      Musculoskeletal   Abdominal   Peds  Hematology   Anesthesia Other Findings   Reproductive/Obstetrics                           Anesthesia Physical Anesthesia Plan  ASA: II  Anesthesia Plan: General   Post-op Pain Management:    Induction: Intravenous  Airway Management Planned: LMA  Additional Equipment:   Intra-op Plan:   Post-operative Plan: Extubation in OR  Informed Consent: I have reviewed the patients History and Physical, chart, labs and discussed the procedure including the risks, benefits and alternatives for the proposed anesthesia with the patient or authorized representative who has indicated his/her understanding and acceptance.     Plan Discussed with:   Anesthesia Plan Comments: (Possible GOT discussed and she agrees.)        Anesthesia Quick Evaluation

## 2013-10-03 NOTE — Transfer of Care (Signed)
Immediate Anesthesia Transfer of Care Note  Patient: Jenna Love  Procedure(s) Performed: Procedure(s): DILATATION AND CURETTAGE /HYSTEROSCOPY (N/A) REMOVAL OF ENDOMETRIAL POLYP (N/A)  Patient Location: PACU  Anesthesia Type:General  Level of Consciousness: awake, alert , oriented and patient cooperative  Airway & Oxygen Therapy: Patient Spontanous Breathing and Patient connected to face mask oxygen  Post-op Assessment: Report given to PACU RN and Post -op Vital signs reviewed and stable  Post vital signs: Reviewed and stable  Complications: No apparent anesthesia complications

## 2013-10-03 NOTE — Interval H&P Note (Signed)
History and Physical Interval Note:  10/03/2013 7:15 AM  Jenna Love  has presented today for surgery, with the diagnosis of Post menopausal bleeding endometrial polyp  The various methods of treatment have been discussed with the patient and family. After consideration of risks, benefits and other options for treatment, the patient has consented to  Procedure(s): DILATATION AND CURETTAGE /HYSTEROSCOPY (N/A) POLYPECTOMY (N/A) as a surgical intervention .  The patient's history has been reviewed, patient examined, no change in status, stable for surgery.  I have reviewed the patient's chart and labs.  Questions were answered to the patient's satisfaction.     Tilda BurrowFERGUSON,Fumi Guadron V

## 2013-10-03 NOTE — Anesthesia Procedure Notes (Signed)
Procedure Name: LMA Insertion Date/Time: 10/03/2013 7:35 AM Performed by: Pernell DupreADAMS, AMY A Pre-anesthesia Checklist: Patient identified, Timeout performed, Emergency Drugs available, Suction available and Patient being monitored Patient Re-evaluated:Patient Re-evaluated prior to inductionPreoxygenation: Pre-oxygenation with 100% oxygen Intubation Type: IV induction Ventilation: Mask ventilation without difficulty LMA: LMA inserted LMA Size: 4.0 Number of attempts: 1 Placement Confirmation: positive ETCO2 and breath sounds checked- equal and bilateral Tube secured with: Tape Dental Injury: Teeth and Oropharynx as per pre-operative assessment

## 2013-10-03 NOTE — Telephone Encounter (Signed)
Medication called to pharmacy. 

## 2013-10-03 NOTE — Brief Op Note (Addendum)
.  10/03/2013  8:37 AM  PATIENT:  Jenna Love  53 y.o. female  PRE-OPERATIVE DIAGNOSIS:  Post menopausal bleeding endometrial polyp  POST-OPERATIVE DIAGNOSIS:  Post menopausal bleeding endometrial polyp  PROCEDURE:  Procedure(s): DILATATION AND CURETTAGE /HYSTEROSCOPY (N/A) REMOVAL OF ENDOMETRIAL POLYP (N/A)  SURGEON:  Surgeon(s) and Role:    * Tilda BurrowJohn Damika Harmon V, MD - Primary  PHYSICIAN ASSISTANT:   ASSISTANTS: none   ANESTHESIA:   none  EBL:  Total I/O In: 1400 [I.V.:1400] Out: 10 [Blood:10]  BLOOD ADMINISTERED:none  DRAINS: none   LOCAL MEDICATIONS USED:  NONE  SPECIMEN:  Source of Specimen:  Endometrial curettings, endometrial polyp  DISPOSITION OF SPECIMEN:  PATHOLOGY  COUNTS:  YES  TOURNIQUET:  * No tourniquets in log *  DICTATION: .Dragon Dictation  PLAN OF CARE: Discharge to home after PACU  PATIENT DISPOSITION:  PACU - hemodynamically stable.   Delay start of Pharmacological VTE agent (>24hrs) due to surgical blood loss or risk of bleeding: not applicable

## 2013-10-03 NOTE — Op Note (Signed)
10/03/2013  8:37 AM  PATIENT:  Jenna Love  53 y.o. female  PRE-OPERATIVE DIAGNOSIS:  Post menopausal bleeding endometrial polyp  POST-OPERATIVE DIAGNOSIS:  Post menopausal bleeding endometrial polyp  PROCEDURE:  Procedure(s): DILATATION AND CURETTAGE /HYSTEROSCOPY (N/A) REMOVAL OF ENDOMETRIAL POLYP (N/A)  SURGEON:  Surgeon(s) and Role:    * Tilda BurrowJohn Yevette Knust V, MD - Primary  PHYSICIAN ASSISTANT:   ASSISTANTS: none   ANESTHESIA:   none  EBL:  Total I/O In: 1400 [I.V.:1400] Out: 10 [Blood:10]  BLOOD ADMINISTERED:none  DRAINS: none   LOCAL MEDICATIONS USED:  NONE  SPECIMEN:  Source of Specimen:  Endometrial curettings, endometrial polyp  DISPOSITION OF SPECIMEN:  PATHOLOGY  COUNTS:  YES  TOURNIQUET:  * No tourniquets in log *  DICTATION: .Dragon Dictation  PLAN OF CARE: Discharge to home after PACU  PATIENT DISPOSITION:  PACU - hemodynamically stable.   Delay start of Pharmacological VTE agent (>24hrs) due to surgical blood loss or risk of bleeding: not applicable   Details of procedure: Patient was taken operating room prepped and draped for vaginal procedure, timeout conducted and surgical team identified. Speculum was inserted, large bivalve Graves speculum, allowing visualization of cervix with some significant difficulty due to the patient's severely anteflexed uterus and unusual vaginal length . Cervix was grasped with single-tooth tenaculum at 3:00 and 9:00, sounded to 9 cm, dilated to 25 [place] allowing introduction of a rigid 30 operative hysteroscope which visualized the endometrial cavity with 2 small 1 cm polyps noted in the cornual area of the uterus on the right. Hysteroscopic scissors were then used to amputate the 2 polyps. Smooth sharp curettage was then used to gently curet the endometrial cavity obtaining minimal additional tissue specimen. Hysteroscopic visualization showed that the endometrium was clean, the myometrium was intact and patient  was allowed go recovery room in good condition after completion of the procedure sponge and needle counts were correct. End of dictation .

## 2013-10-07 ENCOUNTER — Telehealth: Payer: Self-pay | Admitting: Obstetrics and Gynecology

## 2013-10-07 NOTE — Telephone Encounter (Signed)
Pt made aware of benign pathology on polyp/ Pt on no HT , and is not interested in any HT Pt seeing dr Fransico HimNIDA for thyroid nodule  Pt will f/u prn,appt Friday cancelled

## 2013-10-09 ENCOUNTER — Telehealth: Payer: Self-pay | Admitting: *Deleted

## 2013-10-09 NOTE — Telephone Encounter (Signed)
Pt informed of benign polyp results from 10/03/2013. Pt also canceled her appt for tomorrow, states spoke with Dr. Emelda FearFerguson early this week and was told did not need to keep appt.

## 2013-10-09 NOTE — Telephone Encounter (Signed)
Message copied by Criss AlvinePULLIAM, Amaira Safley G on Thu Oct 09, 2013  8:40 AM ------      Message from: Tilda BurrowFERGUSON, JOHN V      Created: Tue Oct 07, 2013  9:58 PM       Benign endometrial polyp, ------

## 2013-10-10 ENCOUNTER — Encounter: Payer: PRIVATE HEALTH INSURANCE | Admitting: Obstetrics and Gynecology

## 2013-10-27 ENCOUNTER — Other Ambulatory Visit: Payer: Self-pay | Admitting: Family Medicine

## 2013-10-27 ENCOUNTER — Telehealth: Payer: Self-pay | Admitting: *Deleted

## 2013-10-27 MED ORDER — CLONAZEPAM 1 MG PO TABS: ORAL_TABLET | ORAL | Status: DC

## 2013-10-27 MED ORDER — ZOLPIDEM TARTRATE 10 MG PO TABS: ORAL_TABLET | ORAL | Status: DC

## 2013-10-27 NOTE — Telephone Encounter (Signed)
Okay to refill, schedule OV for 1 month out

## 2013-10-27 NOTE — Telephone Encounter (Signed)
Medication called to pharmacy.  Appointment scheduled.  

## 2013-10-27 NOTE — Telephone Encounter (Signed)
Received fax requesting refill on Zolpidem and Clonazepam.   Ok to refill??  Last office visit 05/20/2013.  Last refill 10/03/2013.

## 2013-11-03 ENCOUNTER — Encounter: Payer: Self-pay | Admitting: Family Medicine

## 2013-11-03 ENCOUNTER — Ambulatory Visit (INDEPENDENT_AMBULATORY_CARE_PROVIDER_SITE_OTHER): Payer: PRIVATE HEALTH INSURANCE | Admitting: Family Medicine

## 2013-11-03 VITALS — BP 132/74 | HR 72 | Temp 98.1°F | Resp 14 | Ht 63.0 in | Wt 259.0 lb

## 2013-11-03 DIAGNOSIS — F3289 Other specified depressive episodes: Secondary | ICD-10-CM

## 2013-11-03 DIAGNOSIS — E669 Obesity, unspecified: Secondary | ICD-10-CM

## 2013-11-03 DIAGNOSIS — F32A Depression, unspecified: Secondary | ICD-10-CM

## 2013-11-03 DIAGNOSIS — G4700 Insomnia, unspecified: Secondary | ICD-10-CM

## 2013-11-03 DIAGNOSIS — F329 Major depressive disorder, single episode, unspecified: Secondary | ICD-10-CM

## 2013-11-03 DIAGNOSIS — F411 Generalized anxiety disorder: Secondary | ICD-10-CM

## 2013-11-03 DIAGNOSIS — E781 Pure hyperglyceridemia: Secondary | ICD-10-CM

## 2013-11-03 MED ORDER — SUVOREXANT 10 MG PO TABS: 1.0000 | ORAL_TABLET | Freq: Every evening | ORAL | Status: DC | PRN

## 2013-11-03 NOTE — Patient Instructions (Addendum)
Increase your zoloft to 1 1/2 tablet once  Day  Try the new sleeping pill Belsomra Weight loss pill- Belviq 10mg  twice day - you can look into this  F/U 2 months

## 2013-11-04 NOTE — Progress Notes (Signed)
Patient ID: Jenna Love, female   DOB: January 31, 1961, 53 y.o.   MRN: [id number]   Subjective:    Patient ID: Jenna Love, female    DOB: January 31, 1961, 53 y.o.   MRN: [id number]  Patient presents for Discuss weight loss and Medication Review   patient here to followup medications. Her mother passed away in July she's been having a lot of difficulty with depression. She's been very upset and sad and feels like she just does not care therefore sits around and eats a lot and has gained weight which makes her more depressed. She's only been taking Zoloft 100 mg which he's been on for the past 14 years. She denies any suicidal ideations. Denies a hallucinations. Her sleep is also still very poor and the Ambien does not seem to be helping her anymore. Regarding her weight she would like to try weight loss medication as well she thinks it will give her some energy to help curb her appetite and she could lose weight this will also help her depression.    Review Of Systems:  GEN- denies fatigue, fever, weight loss,weakness, recent illness HEENT- denies eye drainage, change in vision, nasal discharge, CVS- denies chest pain, palpitations RESP- denies SOB, cough, wheeze ABD- denies N/V, change in stools, abd pain GU- denies dysuria, hematuria, dribbling, incontinence MSK- denies joint pain, muscle aches, injury Neuro- denies headache, dizziness, syncope, seizure activity       Objective:    BP 132/74  Pulse 72  Temp(Src) 98.1 F (36.7 C) (Oral)  Resp 14  Ht 5\' 3"  (1.6 m)  Wt 259 lb (117.482 kg)  BMI 45.89 kg/m2 GEN- NAD, alert and oriented x3,obese HEENT- PERRL, EOMI, non injected sclera, pink conjunctiva, MMM, oropharynx clear Neck- Supple, no thyromegaly CVS- RRR, no murmur RESP-CTAB Psych- depressed affect, not anxious appearing, no SI, well groomed        Assessment & Plan:      Problem List Items Addressed This Visit   Obesity   Insomnia   Hypertriglyceridemia   Relevant Orders      Lipid panel   ANXIETY - Primary      Note: This dictation was prepared with Dragon dictation along with smaller phrase technology. Any transcriptional errors that result from this process are unintentional.

## 2013-11-04 NOTE — Assessment & Plan Note (Signed)
Will give her a call the Belsomra

## 2013-11-04 NOTE — Assessment & Plan Note (Signed)
Discussed medication options will first increase her to 150mg  once a day and see how she does ANother option is the [place] or Brintillex

## 2013-11-04 NOTE — Assessment & Plan Note (Signed)
We discussed weight loss medications however want to make sure that her mood is more stable before we try these as they're known to worsen anxiety and depression symptoms. We did discuss the Belviq she has been on phentermine in the past and did not have any significant weight loss with a. She's also been on [employer] in the past who discussed babysitting her back into this as well.

## 2013-11-04 NOTE — Assessment & Plan Note (Signed)
Increase zoloft to 150mg  once a day  Continue xanax

## 2013-11-10 ENCOUNTER — Encounter: Payer: Self-pay | Admitting: Family Medicine

## 2013-11-10 MED ORDER — ZOLPIDEM TARTRATE 10 MG PO TABS: 10.0000 mg | ORAL_TABLET | Freq: Every evening | ORAL | Status: DC | PRN

## 2013-11-21 ENCOUNTER — Other Ambulatory Visit: Payer: Self-pay | Admitting: Family Medicine

## 2013-11-21 MED ORDER — CLONAZEPAM 1 MG PO TABS: ORAL_TABLET | ORAL | Status: DC

## 2013-11-21 NOTE — Telephone Encounter (Signed)
MD approved refill.   Medication called to pharmacy.

## 2013-12-01 ENCOUNTER — Ambulatory Visit: Payer: Self-pay | Admitting: Family Medicine

## 2013-12-08 ENCOUNTER — Other Ambulatory Visit: Payer: Self-pay | Admitting: Family Medicine

## 2013-12-08 MED ORDER — ZOLPIDEM TARTRATE 10 MG PO TABS: 10.0000 mg | ORAL_TABLET | Freq: Every evening | ORAL | Status: DC | PRN

## 2013-12-08 NOTE — Telephone Encounter (Signed)
MD approved refill.   Medication called to pharmacy.

## 2013-12-10 ENCOUNTER — Other Ambulatory Visit: Payer: Self-pay | Admitting: Family Medicine

## 2013-12-11 ENCOUNTER — Telehealth: Payer: Self-pay | Admitting: *Deleted

## 2013-12-11 NOTE — Telephone Encounter (Signed)
Call placed to patient.   States that she had switched back to Ambien. The Belsomra was ineffective.   Refill denied.

## 2013-12-11 NOTE — Telephone Encounter (Signed)
Received call from patient.   Reports that MD requested to have patient contact office when she had increased Zoloft before starting Belviq.   States that she has increased medication and is stable.   Requested prescription for Belviq.   MD please advise.

## 2013-12-12 MED ORDER — LORCASERIN HCL 10 MG PO TABS: 10.0000 mg | ORAL_TABLET | Freq: Two times a day (BID) | ORAL | Status: DC

## 2013-12-12 NOTE — Telephone Encounter (Signed)
Prescription printed and patient made aware to come to office to pick up.  

## 2013-12-12 NOTE — Telephone Encounter (Signed)
Okay to send Belviq  BID #60 R 1,keep previous F/U

## 2013-12-25 ENCOUNTER — Telehealth: Payer: Self-pay | Admitting: *Deleted

## 2013-12-25 NOTE — Telephone Encounter (Signed)
Received fax requesting refill on Belviq.   PA submitted.

## 2013-12-26 ENCOUNTER — Encounter: Payer: Self-pay | Admitting: Family Medicine

## 2014-01-05 ENCOUNTER — Other Ambulatory Visit: Payer: Self-pay | Admitting: Family Medicine

## 2014-01-05 MED ORDER — ZOLPIDEM TARTRATE 10 MG PO TABS: 10.0000 mg | ORAL_TABLET | Freq: Every evening | ORAL | Status: DC | PRN

## 2014-01-05 NOTE — Telephone Encounter (Signed)
Medication called to pharmacy. 

## 2014-01-19 ENCOUNTER — Encounter: Payer: Self-pay | Admitting: Family Medicine

## 2014-01-22 NOTE — Telephone Encounter (Signed)
Call placed to [employer](Insurance).   Was advised that PA is noted to still be in pharmacy review. PA has exceeded 15 day review period. Requested to have PA moved to [facility] for a faster determination.   Patient aware per MyChart.

## 2014-01-23 NOTE — Telephone Encounter (Signed)
Received PA determination.   PA approved 12/25/2013- 03/27/2014.  Ref# [id number]

## 2014-02-06 ENCOUNTER — Encounter: Payer: Self-pay | Admitting: Family Medicine

## 2014-02-06 MED ORDER — CLONAZEPAM 1 MG PO TABS: ORAL_TABLET | ORAL | Status: DC

## 2014-02-06 NOTE — Telephone Encounter (Signed)
Medication called to pharmacy. 

## 2014-04-28 ENCOUNTER — Encounter: Payer: Self-pay | Admitting: *Deleted

## 2014-04-28 ENCOUNTER — Encounter: Payer: Self-pay | Admitting: Family Medicine

## 2014-04-28 MED ORDER — CLONAZEPAM 1 MG PO TABS: ORAL_TABLET | ORAL | Status: DC

## 2014-04-28 NOTE — Telephone Encounter (Signed)
MD made aware and approved refill x1.   Advised that patient requires appointment for medication review since she is using Clonazepam more frequently.   Call placed to patient and patient made aware.   Appointment scheduled.

## 2014-04-29 ENCOUNTER — Encounter: Payer: Self-pay | Admitting: Family Medicine

## 2014-04-29 MED ORDER — ZOLPIDEM TARTRATE 10 MG PO TABS: 10.0000 mg | ORAL_TABLET | Freq: Every evening | ORAL | Status: DC | PRN

## 2014-04-29 NOTE — Telephone Encounter (Signed)
Per MD, medication called to pharmacy.

## 2014-05-01 ENCOUNTER — Telehealth: Payer: Self-pay | Admitting: Family Medicine

## 2014-05-01 NOTE — Telephone Encounter (Signed)
If her pain worsens go to ER, give her red flags, otherwise can see next week as scheduled

## 2014-05-01 NOTE — Telephone Encounter (Signed)
Left sided abd pain.  Off and on. Some mild swelling in hands.  Off and on.  Has appt with provider next Wednesday.  Some nausea.  No problem with bowel/bladder.  No shortness of breath.  Denies any acute distress.  Asked how she felt?  Up to her what she wants to do.  Did discuss "if" heart related, women classically do not present with text book symptoms.  If discomfort worsens in any way should get medical attention ASAP.

## 2014-05-02 ENCOUNTER — Other Ambulatory Visit: Payer: Self-pay | Admitting: Family Medicine

## 2014-05-03 ENCOUNTER — Encounter (HOSPITAL_COMMUNITY): Payer: Self-pay | Admitting: Emergency Medicine

## 2014-05-03 ENCOUNTER — Emergency Department (HOSPITAL_COMMUNITY)
Admission: EM | Admit: 2014-05-03 | Discharge: 2014-05-03 | Disposition: A | Discharge status: Home / Self Care | Payer: PRIVATE HEALTH INSURANCE | Attending: Emergency Medicine | Admitting: Emergency Medicine

## 2014-05-03 ENCOUNTER — Emergency Department (HOSPITAL_COMMUNITY): Payer: PRIVATE HEALTH INSURANCE

## 2014-05-03 DIAGNOSIS — R1012 Left upper quadrant pain: Secondary | ICD-10-CM | POA: Insufficient documentation

## 2014-05-03 DIAGNOSIS — Z9889 Other specified postprocedural states: Secondary | ICD-10-CM | POA: Diagnosis not present

## 2014-05-03 DIAGNOSIS — Z8701 Personal history of pneumonia (recurrent): Secondary | ICD-10-CM | POA: Diagnosis not present

## 2014-05-03 DIAGNOSIS — R2240 Localized swelling, mass and lump, unspecified lower limb: Secondary | ICD-10-CM | POA: Diagnosis not present

## 2014-05-03 DIAGNOSIS — R0600 Dyspnea, unspecified: Secondary | ICD-10-CM

## 2014-05-03 DIAGNOSIS — Z8639 Personal history of other endocrine, nutritional and metabolic disease: Secondary | ICD-10-CM | POA: Diagnosis not present

## 2014-05-03 DIAGNOSIS — F410 Panic disorder [episodic paroxysmal anxiety] without agoraphobia: Secondary | ICD-10-CM | POA: Insufficient documentation

## 2014-05-03 DIAGNOSIS — Z79899 Other long term (current) drug therapy: Secondary | ICD-10-CM | POA: Diagnosis not present

## 2014-05-03 DIAGNOSIS — R0602 Shortness of breath: Secondary | ICD-10-CM | POA: Diagnosis present

## 2014-05-03 LAB — CBC WITH DIFFERENTIAL/PLATELET
BASOS ABS: 0 10*3/uL (ref 0.0–0.1)
Basophils Relative: 0 % (ref 0–1)
EOS ABS: 0.3 10*3/uL (ref 0.0–0.7)
Eosinophils Relative: 4 % (ref 0–5)
HCT: 40.1 % (ref 36.0–46.0)
Hemoglobin: 13.6 g/dL (ref 12.0–15.0)
Lymphocytes Relative: 37 % (ref 12–46)
Lymphs Abs: 2.2 10*3/uL (ref 0.7–4.0)
MCH: 30 pg (ref 26.0–34.0)
MCHC: 33.9 g/dL (ref 30.0–36.0)
MCV: 88.3 fL (ref 78.0–100.0)
MONO ABS: 0.5 10*3/uL (ref 0.1–1.0)
Monocytes Relative: 8 % (ref 3–12)
Neutro Abs: 3.1 10*3/uL (ref 1.7–7.7)
Neutrophils Relative %: 51 % (ref 43–77)
Platelets: 249 10*3/uL (ref 150–400)
RBC: 4.54 MIL/uL (ref 3.87–5.11)
RDW: 13.5 % (ref 11.5–15.5)
WBC: 6.1 10*3/uL (ref 4.0–10.5)

## 2014-05-03 LAB — COMPREHENSIVE METABOLIC PANEL
ALT: 27 U/L (ref 0–35)
AST: 25 U/L (ref 0–37)
Albumin: 3.9 g/dL (ref 3.5–5.2)
Alkaline Phosphatase: 31 U/L — ABNORMAL LOW (ref 39–117)
Anion gap: 4 — ABNORMAL LOW (ref 5–15)
BUN: 16 mg/dL (ref 6–23)
CALCIUM: 8.8 mg/dL (ref 8.4–10.5)
CHLORIDE: 107 mmol/L (ref 96–112)
CO2: 26 mmol/L (ref 19–32)
Creatinine, Ser: 0.96 mg/dL (ref 0.50–1.10)
GFR calc Af Amer: 78 mL/min — ABNORMAL LOW (ref >90)
GFR calc non Af Amer: 67 mL/min — ABNORMAL LOW (ref >90)
Glucose, Bld: 102 mg/dL — ABNORMAL HIGH (ref 70–99)
POTASSIUM: 3.8 mmol/L (ref 3.5–5.1)
SODIUM: 137 mmol/L (ref 135–145)
TOTAL PROTEIN: 7.2 g/dL (ref 6.0–8.3)
Total Bilirubin: 0.7 mg/dL (ref 0.3–1.2)

## 2014-05-03 LAB — URINALYSIS, ROUTINE W REFLEX MICROSCOPIC
Bilirubin Urine: NEGATIVE
Glucose, UA: NEGATIVE mg/dL
KETONES UR: NEGATIVE mg/dL
Leukocytes, UA: NEGATIVE
Nitrite: NEGATIVE
PROTEIN: NEGATIVE mg/dL
SPECIFIC GRAVITY, URINE: 1.015 (ref 1.005–1.030)
Urobilinogen, UA: 0.2 mg/dL (ref 0.0–1.0)
pH: 5.5 (ref 5.0–8.0)

## 2014-05-03 LAB — URINE MICROSCOPIC-ADD ON

## 2014-05-03 LAB — D-DIMER, QUANTITATIVE (NOT AT ARMC)

## 2014-05-03 LAB — LIPASE, BLOOD: Lipase: 41 U/L (ref 11–59)

## 2014-05-03 LAB — TROPONIN I: Troponin I: <0.03 ng/mL (ref <0.031)

## 2014-05-03 MED ORDER — FUROSEMIDE 20 MG PO TABS: 20.0000 mg | ORAL_TABLET | Freq: Every day | ORAL | Status: DC

## 2014-05-03 MED ORDER — HYDROCODONE-ACETAMINOPHEN 5-325 MG PO TABS: 2.0000 | ORAL_TABLET | ORAL | Status: DC | PRN

## 2014-05-03 NOTE — ED Notes (Signed)
EDP at bedside  

## 2014-05-03 NOTE — ED Notes (Signed)
Pt reports generalized swelling, sob, left sided rib pain radiating to back since Friday. Pt reports pain intermittent.

## 2014-05-03 NOTE — ED Provider Notes (Signed)
CSN: [id number]     Arrival date & time 05/03/14  1101 History  This chart was scribed for Donnetta Hutching, MD by Littie Deeds, ED Scribe. This patient was seen in room APA19/AP[facility]19 and the patient's care was started at 11:13 AM.      Chief Complaint  Patient presents with  . Shortness of Breath   The history is provided by the patient. No language interpreter was used.   HPI Comments: RIM THATCH is a 54 y.o. female with a hx of thyroid who presents to the Emergency Department complaining of gradual onset SOB that started this morning. Patient also reports having swelling to her hands and legs that started 2 days ago, and intermittent pain under her left ribcage radiating to her left upper back up to her shoulder blade that started 2 days ago. She has had difficulty ambulating due to the SOB, but she did ambulate here this morning. She is currently in menopause, but she notes that she did start a menstrual cycle 2 days ago in the afternoon. She was unable to get into her PCP's office then. Patient denies chest pain, diaphoresis, nausea, vomiting, diarrhea, and urinary symptoms. She also denies smoking, recent long travel, PMHx of cardiac problems and FMHx of cardiac problems. She states she has been eating fine. Patient works for the [employer] and notes that she does not exercise.   PCP: Dr. Jeanice Lim  Past Medical History  Diagnosis Date  . Anxiety   . Panic attacks   . Thyroid nodule 2013    Benign biopsy- ENT Dr. Pollyann Kennedy  . Pneumonia   . Panic attacks   . Anxiety    Past Surgical History  Procedure Laterality Date  . Cesarean section  1992  . Hysteroscopy w/d&c N/A 10/03/2013    Procedure: DILATATION AND CURETTAGE /HYSTEROSCOPY;  Surgeon: Tilda Burrow, MD;  Location: [facility];  Service: Gynecology;  Laterality: N/A;  . Polypectomy N/A 10/03/2013    Procedure: REMOVAL OF ENDOMETRIAL POLYP;  Surgeon: Tilda Burrow, MD;  Location: [facility];  Service: Gynecology;  Laterality: N/A;    Family History  Problem Relation Age of Onset  . Diabetes Mother   . Alzheimer's disease Mother   . Cancer Father     lung    History  Substance Use Topics  . Smoking status: Never Smoker   . Smokeless tobacco: Never Used  . Alcohol Use: No   OB History    Gravida Para Term Preterm AB TAB SAB Ectopic Multiple Living   Review of Systems  Constitutional: Negative for diaphoresis.  Respiratory: Positive for shortness of breath.   Cardiovascular: Positive for leg swelling. Negative for chest pain.  Gastrointestinal: Positive for abdominal pain. Negative for nausea, vomiting and diarrhea.  Genitourinary: Positive for menstrual problem. Negative for dysuria, urgency, frequency, hematuria, decreased urine volume, enuresis and difficulty urinating.  Musculoskeletal: Positive for back pain.  All other systems reviewed and are negative.     Allergies  Review of patient's allergies indicates no known allergies.  Home Medications   Prior to Admission medications   Medication Sig Start Date End Date Taking? Authorizing Provider  clonazePAM (KLONOPIN) 1 MG tablet TAKE (1) TABLET BY MOUTH THREE TIMES DAILY AS NEEDED. Patient taking differently: Take 1 mg by mouth 3 (three) times daily as needed for anxiety. TAKE (1) TABLET BY MOUTH THREE TIMES DAILY AS NEEDED. 04/28/14  Yes  Salley ScarletKawanta F Ellsworth, MD  Nutritional Supplements (JUICE PLUS FIBRE PO) Take 4 capsules by mouth daily.   Yes Historical Provider, MD  sertraline (ZOLOFT) 100 MG tablet TAKE 1 AND 1/2 TABLETS DAILY. 08/07/13  Yes Salley ScarletKawanta F Hawkins, MD  zolpidem (AMBIEN) 10 MG tablet Take 1 tablet (10 mg total) by mouth at bedtime as needed for sleep. 04/29/14 05/29/14 Yes Salley ScarletKawanta F Crosslake, MD  furosemide (LASIX) 20 MG tablet Take 1 tablet (20 mg total) by mouth daily. 05/03/14   Donnetta HutchingBrian Katye Valek, MD  HYDROcodone-acetaminophen (NORCO) 5-325 MG per tablet Take 2 tablets by mouth every 4 (four) hours as needed. 05/03/14   Donnetta HutchingBrian Ilhan Debenedetto,  MD  Lorcaserin HCl 10 MG TABS Take 10 mg by mouth 2 (two) times daily. Patient not taking: Reported on 05/03/2014 12/12/13   Salley ScarletKawanta F Whiting, MD   BP 124/83 mmHg  Pulse 86  Temp(Src) 98.2 F (36.8 C) (Oral)  Resp 23  Ht 5\' 3"  (1.6 m)  Wt 250 lb (113.399 kg)  BMI 44.30 kg/m2  SpO2 96% Physical Exam  Constitutional: She is oriented to person, place, and time. She appears well-developed and well-nourished.  Obese.  HENT:  Head: Normocephalic and atraumatic.  Eyes: Conjunctivae and EOM are normal. Pupils are equal, round, and reactive to light.  Neck: Normal range of motion. Neck supple.  Cardiovascular: Regular rhythm.   Pulmonary/Chest: Effort normal and breath sounds normal.  Abdominal: Soft. Bowel sounds are normal. There is tenderness in the left upper quadrant and left lower quadrant.  Musculoskeletal: Normal range of motion.  Neurological: She is alert and oriented to person, place, and time.  Skin: Skin is warm and dry.  Psychiatric: She has a normal mood and affect. Her behavior is normal.  Nursing note and vitals reviewed.   ED Course  Procedures  DIAGNOSTIC STUDIES: Oxygen Saturation is 99% on room air, normal by my interpretation.    COORDINATION OF CARE: 11:18 AM-Discussed treatment plan which includes labs and CXR with pt at bedside and pt agreed to plan.    Labs Review Labs Reviewed  COMPREHENSIVE METABOLIC PANEL - Abnormal; Notable for the following:    Glucose, Bld 102 (*)    Alkaline Phosphatase 31 (*)    GFR calc non Af Amer 67 (*)    GFR calc Af Amer 78 (*)    Anion gap 4 (*)    All other components within normal limits  URINALYSIS, ROUTINE W REFLEX MICROSCOPIC - Abnormal; Notable for the following:    Hgb urine dipstick MODERATE (*)    All other components within normal limits  CBC WITH DIFFERENTIAL/PLATELET  LIPASE, BLOOD  TROPONIN I  D-DIMER, QUANTITATIVE  URINE MICROSCOPIC-ADD ON    Imaging Review Dg Abd Acute W/chest  05/03/2014    CLINICAL DATA:  LUQ and LLQ abdominal pain, SOB since Friday, retaining fluid in both legs and hands since last week" history of pneumonia  EXAM: ACUTE ABDOMEN SERIES (ABDOMEN 2 VIEW & CHEST 1 VIEW)  COMPARISON:  05/21/2013 and earlier studies  FINDINGS: Heart size and mediastinal contours are within normal limits.  Lungs are clear. No effusion.  No free air. Small bowel decompressed. Moderate proximal colonic fecal material.  Multiple pelvic phleboliths.  Mild spondylitic changes in the lower lumbar spine.  IMPRESSION: No acute cardiopulmonary disease.  Nonobstructed bowel gas pattern.   Electronically Signed   By: Corlis Leak  Hassell M.D.   On: 05/03/2014 13:09     EKG Interpretation   Date/Time:  Sunday May 03 2014 11:12:28 EST Ventricular Rate:  104 PR Interval:  162 QRS Duration: 79 QT Interval:  349 QTC Calculation: 459 R Axis:   28 Text Interpretation:  Sinus tachycardia Low voltage, precordial leads  Baseline wander in lead(s) III aVL V1 V3 V4 V6 Confirmed by Ivaan Liddy  MD,  Cyncere Ruhe ([id number]) on 05/03/2014 11:27:06 AM      MDM   Final diagnoses:  LUQ abdominal pain  Dyspnea   Patient is in no acute distress. Screening labs, urinalysis, acute abdominal series, EKG showed no acute findings. Discussed with patient and her husband. Discharge medications Norco and Lasix 20 mg for excessive fluid. She has follow-up with her primary care doctor this week.  I personally performed the services described in this documentation, which was scribed in my presence. The recorded information has been reviewed and is accurate.    Donnetta Hutching, MD 05/03/14 1356

## 2014-05-03 NOTE — Discharge Instructions (Signed)
Tests were good. Prescription for pain and mild fluid pill.   Follow-up your doctor on Wednesday

## 2014-05-06 ENCOUNTER — Encounter: Payer: Self-pay | Admitting: Family Medicine

## 2014-05-06 ENCOUNTER — Ambulatory Visit (INDEPENDENT_AMBULATORY_CARE_PROVIDER_SITE_OTHER): Payer: PRIVATE HEALTH INSURANCE | Admitting: Family Medicine

## 2014-05-06 VITALS — BP 118/72 | HR 76 | Temp 97.9°F | Resp 14 | Ht 63.0 in | Wt 267.0 lb

## 2014-05-06 DIAGNOSIS — R609 Edema, unspecified: Secondary | ICD-10-CM

## 2014-05-06 DIAGNOSIS — F329 Major depressive disorder, single episode, unspecified: Secondary | ICD-10-CM

## 2014-05-06 DIAGNOSIS — R7301 Impaired fasting glucose: Secondary | ICD-10-CM | POA: Diagnosis not present

## 2014-05-06 DIAGNOSIS — R0602 Shortness of breath: Secondary | ICD-10-CM

## 2014-05-06 DIAGNOSIS — F32A Depression, unspecified: Secondary | ICD-10-CM

## 2014-05-06 DIAGNOSIS — R600 Localized edema: Secondary | ICD-10-CM | POA: Insufficient documentation

## 2014-05-06 LAB — LIPID PANEL
Cholesterol: 158 mg/dL (ref 0–200)
HDL: 50 mg/dL (ref >39)
LDL CALC: 78 mg/dL (ref 0–99)
Total CHOL/HDL Ratio: 3.2 Ratio
Triglycerides: 151 mg/dL — ABNORMAL HIGH (ref <150)
VLDL: 30 mg/dL (ref 0–40)

## 2014-05-06 LAB — COMPREHENSIVE METABOLIC PANEL
ALK PHOS: 28 U/L — AB (ref 39–117)
ALT: 26 U/L (ref 0–35)
AST: 24 U/L (ref 0–37)
Albumin: 4.3 g/dL (ref 3.5–5.2)
BUN: 14 mg/dL (ref 6–23)
CO2: 28 meq/L (ref 19–32)
Calcium: 9.6 mg/dL (ref 8.4–10.5)
Chloride: 100 mEq/L (ref 96–112)
Creat: 0.74 mg/dL (ref 0.50–1.10)
GLUCOSE: 86 mg/dL (ref 70–99)
Potassium: 4 mEq/L (ref 3.5–5.3)
Sodium: 137 mEq/L (ref 135–145)
Total Bilirubin: 0.7 mg/dL (ref 0.2–1.2)
Total Protein: 7.4 g/dL (ref 6.0–8.3)

## 2014-05-06 LAB — TSH: TSH: 2.598 u[IU]/mL (ref 0.350–4.500)

## 2014-05-06 LAB — BRAIN NATRIURETIC PEPTIDE: BRAIN NATRIURETIC PEPTIDE: 2.3 pg/mL (ref 0.0–100.0)

## 2014-05-06 MED ORDER — CLONAZEPAM 1 MG PO TABS: ORAL_TABLET | ORAL | Status: DC

## 2014-05-06 MED ORDER — ZOLPIDEM TARTRATE 10 MG PO TABS: 10.0000 mg | ORAL_TABLET | Freq: Every evening | ORAL | Status: DC | PRN

## 2014-05-06 MED ORDER — FUROSEMIDE 20 MG PO TABS: 20.0000 mg | ORAL_TABLET | Freq: Every day | ORAL | Status: DC

## 2014-05-06 MED ORDER — SERTRALINE HCL 100 MG PO TABS: ORAL_TABLET | ORAL | Status: DC

## 2014-05-06 NOTE — Assessment & Plan Note (Signed)
SIgnificant weight loss is needed

## 2014-05-06 NOTE — Assessment & Plan Note (Signed)
Obtain BNP and 2D echo, with overlal weight gain, swelling, and symptoms concern for CHF  EKG- repeated today, shows low voltage, NSR, We way need cardiology pending results

## 2014-05-06 NOTE — Addendum Note (Signed)
Addended by: Milinda AntisURHAM, Ameet Sandy F on: 05/06/2014 02:17 PM   Modules accepted: Orders

## 2014-05-06 NOTE — Assessment & Plan Note (Signed)
Increase zoloft to 150mg  discussed importance of medications and effects, pt agrees, hopefully this will decrease use of her benzo

## 2014-05-06 NOTE — Patient Instructions (Signed)
2D echo to be set up Increase zoloft 150mg  as prescribed Continue lasix We will call with lab results Low salt diet Make appt with GYN F/U 2 months

## 2014-05-06 NOTE — Progress Notes (Signed)
Patient ID: Jenna Love, female   DOB: Feb 27, 1961, 54 y.o.   MRN: [id number]   Subjective:    Patient ID: Jenna Love, female    DOB: [birth year], 54 y.o.   MRN: [id number]  Patient presents for ER F/U and Medication Review/ Refills  Patient here to follow-up ER. Since last Wednesday she noticed some swelling in her hands as well as her feet. She also felt more short of breath over the past couple months. When she walks around her home doing small thing she gets short of breath. She also began to have some left upper quadrant pain and therefore she went to the emergency room. Chest x-ray was done which was unremarkable as well as a KUB which showed some mild constipation. EKG was done which showed some wandering leads and low voltage I do not see the repeat EKG. Her labs were fairly unremarkable but she was noted to be edematous on exam. She was started on Lasix 20 mg. Unfortunately she was not weighed at the hospital she just gave them a weight of 250 pounds which is not the case. She states that she has lost a lot of fluid since being on the Lasix 20 mg the past 3 days.  She denies any chest pain or palpitations. She does get some low back pain which is nonradiating since she is gaining more weight. Of note she never took the last diet pill that I prescribed.  Anxiety and depression she's had long-standing issues with her nerves. She is currently on Zoloft 100 mg despite being advised to go to 150 mg to better control her symptoms. The last couple months we have noticed that she was refilling her Klonopin more often.  Noted at end of visit patient stated that she had some bleeding and she has been in menopause for a year she does have a GYN advised her to make a follow-up appointment with her GYN for this Review Of Systems: per above   GEN-+ fatigue, fever, weight loss,weakness, recent illness HEENT- denies eye drainage, change in vision, nasal discharge, CVS- denies chest pain,  palpitations RESP- +SOB, cough, wheeze ABD- denies N/V, change in stools, abd pain GU- denies dysuria, hematuria, dribbling, incontinence MSK-+joint pain, muscle aches, injury Neuro- denies headache, dizziness, syncope, seizure activity       Objective:    BP 118/72 mmHg  Pulse 76  Temp(Src) 97.9 F (36.6 C) (Oral)  Resp 14  Ht  (1.6 m)  Wt 267 lb (121.11 kg)  BMI 47.31 kg/m2  LMP 05/01/2014 GEN- NAD, alert and oriented x3 HEENT- PERRL, EOMI, non injected sclera, pink conjunctiva, MMM, oropharynx clear Neck- Supple, no JVD, no thyromegaly CVS- RRR, no murmur RESP-CTAB ABD-NABS,soft,NT,ND EXT- trace pedal edema Psych- slight depressed affect, not anxious appearing, no SI, well groomed Pulses- Radial, DP- 2+        Assessment & Plan:      Problem List Items Addressed This Visit      Unprioritized   SOB (shortness of breath)    Obtain BNP and 2D echo, with overlal weight gain, swelling, and symptoms concern for CHF  EKG- repeated today, shows low voltage, NSR, We way need cardiology pending results      Relevant Orders   Brain natriuretic peptide   EKG 12-Lead (Completed)   2D Echocardiogram without contrast   Peripheral edema - Primary    Continue lasix once a day      Relevant Orders   Comprehensive metabolic panel  TSH   2D Echocardiogram without contrast   Morbid obesity    SIgnificant weight loss is needed      Relevant Orders   Lipid panel   Depression    Increase zoloft to 150mg  discussed importance of medications and effects, pt agrees, hopefully this will decrease use of her benzo       Other Visit Diagnoses    Elevated fasting glucose        Relevant Orders    Hemoglobin A1c       Note: This dictation was prepared with Dragon dictation along with smaller phrase technology. Any transcriptional errors that result from this process are unintentional.

## 2014-05-06 NOTE — Assessment & Plan Note (Signed)
Continue lasix once a day

## 2014-05-07 LAB — HEMOGLOBIN A1C
HEMOGLOBIN A1C: 5.8 % — AB (ref <5.7)
MEAN PLASMA GLUCOSE: 120 mg/dL — AB (ref <117)

## 2014-05-13 ENCOUNTER — Ambulatory Visit (HOSPITAL_COMMUNITY)
Admission: RE | Admit: 2014-05-13 | Discharge: 2014-05-13 | Disposition: A | Discharge status: Home / Self Care | Payer: PRIVATE HEALTH INSURANCE | Source: Ambulatory Visit | Attending: Family Medicine | Admitting: Family Medicine

## 2014-05-13 DIAGNOSIS — R0600 Dyspnea, unspecified: Secondary | ICD-10-CM

## 2014-05-13 DIAGNOSIS — E785 Hyperlipidemia, unspecified: Secondary | ICD-10-CM | POA: Diagnosis not present

## 2014-05-13 DIAGNOSIS — R609 Edema, unspecified: Secondary | ICD-10-CM

## 2014-05-13 DIAGNOSIS — R0602 Shortness of breath: Secondary | ICD-10-CM

## 2014-05-13 NOTE — Progress Notes (Signed)
*  PRELIMINARY RESULTS* Echocardiogram 2D Echocardiogram has been performed.  [name], [name] 05/13/2014, 12:03 PM

## 2014-05-15 ENCOUNTER — Encounter: Payer: Self-pay | Admitting: Obstetrics and Gynecology

## 2014-05-15 ENCOUNTER — Ambulatory Visit (INDEPENDENT_AMBULATORY_CARE_PROVIDER_SITE_OTHER): Payer: PRIVATE HEALTH INSURANCE | Admitting: Obstetrics and Gynecology

## 2014-05-15 VITALS — BP 120/80 | HR 84 | Ht 63.0 in

## 2014-05-15 DIAGNOSIS — N951 Menopausal and female climacteric states: Secondary | ICD-10-CM

## 2014-05-15 MED ORDER — MEDROXYPROGESTERONE ACETATE 10 MG PO TABS: 10.0000 mg | ORAL_TABLET | Freq: Every day | ORAL | Status: DC

## 2014-05-15 NOTE — Progress Notes (Signed)
Patient ID: Jenna Love, female   DOB: July 15, 1960, 54 y.o.   MRN: [id number] Pt here today for vaginal bleeding. Pt states that she started bleeding on the 12th of this month and bled for 5 days. Pt states that she stays tired even after getting 8 hours of sleep. Pt has not seen any bleeding since that episode. Pt states that the bleeding was not heavy but normal.

## 2014-05-15 NOTE — Progress Notes (Signed)
Patient ID: Jenna Love, female   DOB: 1960/07/18, 54 y.o.   MRN: [id number]    [facility]  Patient name: Jenna Love MRN [id number]  Date of birth: 1960/07/18  CC & HPI:  Jenna Love is a 54 y.o. female s/p polypectomy and hysteroscopy w/ d&c presenting today for constant left-sided abdominal pain. She also reports vaginal bleeding on the 12th of this month and lasted for 5 days. She has not seen any bleeding since that episode. Pt went to the ED where they ruled out cardiac factors.   ROS:  A complete 10 system review of systems was obtained and all systems are negative except as noted in the HPI and PMH.   Pertinent History Reviewed:   Reviewed: Significant for polypectomy; dysteroscopy w/ d&c Medical         Past Medical History  Diagnosis Date  . Anxiety   . Panic attacks   . Thyroid nodule 2013    Benign biopsy- ENT Dr. Pollyann Kennedyosen  . Pneumonia   . Panic attacks   . Anxiety                               Surgical Hx:    Past Surgical History  Procedure Laterality Date  . Cesarean section  1992  . Hysteroscopy w/d&c N/A 10/03/2013    Procedure: DILATATION AND CURETTAGE /HYSTEROSCOPY;  Surgeon: Tilda BurrowJohn Gray Doering V, MD;  Location: [facility];  Service: Gynecology;  Laterality: N/A;  . Polypectomy N/A 10/03/2013    Procedure: REMOVAL OF ENDOMETRIAL POLYP;  Surgeon: Tilda BurrowJohn Terril Amaro V, MD;  Location: [facility];  Service: Gynecology;  Laterality: N/A;   Medications: Reviewed & Updated - see associated section                       Current outpatient prescriptions:  .  clonazePAM (KLONOPIN) 1 MG tablet, TAKE (1) TABLET BY MOUTH THREE TIMES DAILY AS NEEDED., Disp: 60 tablet, Rfl: 2 .  furosemide (LASIX) 20 MG tablet, Take 1 tablet (20 mg total) by mouth daily., Disp: 30 tablet, Rfl: 3 .  Nutritional Supplements (JUICE PLUS FIBRE PO), Take 4 capsules by mouth daily., Disp: , Rfl:  .  sertraline (ZOLOFT) 100 MG tablet, TAKE 1 AND 1/2 TABLETS DAILY., Disp: 45 tablet, Rfl:  6 .  zolpidem (AMBIEN) 10 MG tablet, Take 1 tablet (10 mg total) by mouth at bedtime as needed for sleep., Disp: 30 tablet, Rfl: 3   Social History: Reviewed -  reports that she has never smoked. She has never used smokeless tobacco.  Objective Findings:  Vitals: Blood pressure 120/80, pulse 84, height 5\' 3"  (1.6 m), last menstrual period 05/01/2014.  Physical Examination: General appearance - alert, well appearing, and in no distress and oriented to person, place, and time Pelvic - normal external genitalia, vulva, vagina, cervix, uterus and adnexa  VULVA: normal appearing vulva with no masses, tenderness or lesions VAGINA: normal appearing vagina with normal color and discharge, no lesions CERVIX: normal appearing cervix without discharge or lesions UTERUS: uterus is normal size, shape, consistency and non-tender; clear secretions  Assessment & Plan:   A:  1. Left-sided abdominal pain 2. Vaginal bleeding that lasted 5 days  3 obesity, desires help P:  1.  Check fsh 2. Discussed wt loss technique 3. Consider Phentermine after 1st month of calorie measuring and incr activity.     This chart  was scribed for Tilda Burrow, MD by Gwenyth Ober, ED Scribe. This patient was seen in room 2 and the patient's care was started at 11:17 AM.   I personally performed the services described in this documentation, which was scribed in my presence. The recorded information has been reviewed and considered accurate. It has been edited as necessary during review. Tilda Burrow, MD

## 2014-05-16 LAB — FOLLICLE STIMULATING HORMONE: FSH: 15 m[IU]/mL

## 2014-05-21 ENCOUNTER — Encounter: Payer: Self-pay | Admitting: Family Medicine

## 2014-05-22 ENCOUNTER — Telehealth: Payer: Self-pay | Admitting: *Deleted

## 2014-05-22 MED ORDER — LORCASERIN HCL 10 MG PO TABS: 10.0000 mg | ORAL_TABLET | Freq: Two times a day (BID) | ORAL | Status: DC

## 2014-05-22 NOTE — Telephone Encounter (Signed)
Received request from pharmacy for PA on Belviq.   PA submitted.   Dx: E66.01- morbid obesity

## 2014-05-26 ENCOUNTER — Encounter: Payer: Self-pay | Admitting: Obstetrics and Gynecology

## 2014-05-26 NOTE — Telephone Encounter (Signed)
Received PA determination.   PA approved 05/26/2014- 08/26/2014.  Case ID: [id number]

## 2014-05-27 ENCOUNTER — Encounter: Payer: Self-pay | Admitting: Obstetrics and Gynecology

## 2014-07-15 ENCOUNTER — Encounter: Payer: Self-pay | Admitting: Family Medicine

## 2014-07-15 ENCOUNTER — Ambulatory Visit (INDEPENDENT_AMBULATORY_CARE_PROVIDER_SITE_OTHER): Payer: PRIVATE HEALTH INSURANCE | Admitting: Obstetrics and Gynecology

## 2014-07-15 ENCOUNTER — Encounter: Payer: Self-pay | Admitting: Obstetrics and Gynecology

## 2014-07-15 ENCOUNTER — Other Ambulatory Visit (HOSPITAL_COMMUNITY)
Admission: RE | Admit: 2014-07-15 | Discharge: 2014-07-15 | Disposition: A | Discharge status: Home / Self Care | Payer: PRIVATE HEALTH INSURANCE | Source: Ambulatory Visit | Attending: Obstetrics and Gynecology | Admitting: Obstetrics and Gynecology

## 2014-07-15 ENCOUNTER — Other Ambulatory Visit: Payer: Self-pay | Admitting: Family Medicine

## 2014-07-15 VITALS — BP 118/72 | Ht 63.0 in | Wt 260.5 lb

## 2014-07-15 DIAGNOSIS — Z1151 Encounter for screening for human papillomavirus (HPV): Secondary | ICD-10-CM | POA: Diagnosis present

## 2014-07-15 DIAGNOSIS — Z01419 Encounter for gynecological examination (general) (routine) without abnormal findings: Secondary | ICD-10-CM

## 2014-07-15 DIAGNOSIS — Z538 Procedure and treatment not carried out for other reasons: Secondary | ICD-10-CM

## 2014-07-15 MED ORDER — CLONAZEPAM 1 MG PO TABS: ORAL_TABLET | ORAL | Status: DC

## 2014-07-15 NOTE — Progress Notes (Signed)
Patient ID: Jenna Love, female   DOB: 05-10-1960, 54 y.o.   MRN: [id number]  Assessment:  Annual Gyn Exam Perimenopause with resumed menses and throough workup 2015 of endometrial polyp. Review menses hx in 3 months.     1. pap smear done, next pap due in 1 year 2. return annually or prn 3    Annual mammogram advised Subjective:  Jenna Love is a 54 y.o. female G2P2 who presents for annual exam. Patient's last menstrual period was 05/25/2014. The patient today wants to discuss her periods. She states she started her period again--one heavy menses 05/19/14, and one before that 2/12. I'm not sure if you got my message yesterday. But I started my period again -really heavy March 1st, previously it was Feb 12th. I was not sure if you wanted me wait and start the hormone pills April or if I need to come back in to see you?? She had been on Provera for some time.  She notes she has lost 7 pounds over the past month by cutting back on snacks.  Patient has 2 children, 1 delivered vaginally and 1 by C-section.   She denies any joint issues.   The following portions of the patient's history were reviewed and updated as appropriate: allergies, current medications, past family history, past medical history, past social history, past surgical history and problem list. Past Medical History  Diagnosis Date  . Anxiety   . Panic attacks   . Thyroid nodule 2013    Benign biopsy- ENT Dr. Pollyann Kennedyosen  . Pneumonia   . Panic attacks   . Anxiety     Past Surgical History  Procedure Laterality Date  . Cesarean section  1992  . Hysteroscopy w/d&c N/A 10/03/2013    Procedure: DILATATION AND CURETTAGE /HYSTEROSCOPY;  Surgeon: Tilda BurrowJohn Hiliary Osorto V, MD;  Location: [facility];  Service: Gynecology;  Laterality: N/A;  . Polypectomy N/A 10/03/2013    Procedure: REMOVAL OF ENDOMETRIAL POLYP;  Surgeon: Tilda BurrowJohn Likisha Alles V, MD;  Location: [facility];  Service: Gynecology;  Laterality: N/A;     Current outpatient  prescriptions:  .  clonazePAM (KLONOPIN) 1 MG tablet, TAKE (1) TABLET BY MOUTH THREE TIMES DAILY AS NEEDED., Disp: 60 tablet, Rfl: 2 .  furosemide (LASIX) 20 MG tablet, Take 1 tablet (20 mg total) by mouth daily., Disp: 30 tablet, Rfl: 3 .  Nutritional Supplements (JUICE PLUS FIBRE PO), Take 4 capsules by mouth daily., Disp: , Rfl:  .  sertraline (ZOLOFT) 100 MG tablet, TAKE 1 AND 1/2 TABLETS DAILY., Disp: 45 tablet, Rfl: 6  Review of Systems Constitutional: negative Gastrointestinal: negative Genitourinary: menstrual problem A complete 10 system review of systems was obtained and all systems are negative except as noted in the HPI and PMH.    Objective:  BP 118/72 mmHg  Ht 5\' 3"  (1.6 m)  Wt 260 lb 8 oz (118.162 kg)  BMI 46.16 kg/m2  LMP 05/25/2014   BMI: Body mass index is 46.16 kg/(m^2).  General Appearance: Alert, appropriate appearance for age. No acute distress HEENT: Grossly normal Neck / Thyroid:  Cardiovascular: RRR; normal S1, S2, no murmur Lungs: CTA bilaterally Back: No CVAT Breast Exam: No dimpling, nipple retraction or discharge. No masses or nodes.  Gastrointestinal: Soft, non-tender, no masses or organomegaly Pelvic Exam: Vulva and vagina appear normal. Bimanual exam reveals normal uterus and adnexa. External genitalia: normal general appearance Urinary system: urethral meatus normal Vaginal: normal mucosa without prolapse or lesions Cervix: normal appearance Adnexa: normal bimanual  exam Uterus: normal single, nontender Rectal: good sphincter tone Rectovaginal: not indicated Lymphatic Exam: Non-palpable nodes in neck, clavicular, axillary, or inguinal regions  Skin: no rash or abnormalities Neurologic: Normal gait and speech, no tremor  Psychiatric: Alert and oriented, appropriate affect.  Urinalysis:Not done   Christin Bach. MD Pgr [phone] 9:19 AM    This chart was scribed for Tilda Burrow, MD by Ronney Lion, ED Scribe. This patient was seen in  room 1 and the patient's care was started at 9:19 AM.   I personally performed the services described in this documentation, which was SCRIBED in my presence. The recorded information has been reviewed and considered accurate. It has been edited as necessary during review. Tilda Burrow, MD

## 2014-07-15 NOTE — Progress Notes (Signed)
Patient ID: Jenna ConverseCathey L Justice, female   DOB: 07/13/1960, 54 y.o.   MRN: [id number] Pt here today for annual exam. Pt states that she wants to discuss her periods with Dr. Emelda FearFerguson.

## 2014-07-16 LAB — CYTOLOGY - PAP

## 2014-07-17 NOTE — Progress Notes (Signed)
No show for appt. 

## 2014-07-21 ENCOUNTER — Ambulatory Visit: Payer: Self-pay | Admitting: Family Medicine

## 2014-08-04 ENCOUNTER — Ambulatory Visit (INDEPENDENT_AMBULATORY_CARE_PROVIDER_SITE_OTHER): Payer: PRIVATE HEALTH INSURANCE | Admitting: Family Medicine

## 2014-08-04 ENCOUNTER — Encounter: Payer: Self-pay | Admitting: Family Medicine

## 2014-08-04 VITALS — BP 136/72 | HR 78 | Temp 98.6°F | Resp 16 | Ht 63.0 in | Wt 260.0 lb

## 2014-08-04 DIAGNOSIS — F329 Major depressive disorder, single episode, unspecified: Secondary | ICD-10-CM

## 2014-08-04 DIAGNOSIS — F32A Depression, unspecified: Secondary | ICD-10-CM

## 2014-08-04 DIAGNOSIS — F411 Generalized anxiety disorder: Secondary | ICD-10-CM

## 2014-08-04 MED ORDER — CLONAZEPAM 1 MG PO TABS: ORAL_TABLET | ORAL | Status: DC

## 2014-08-04 MED ORDER — LORCASERIN HCL 10 MG PO TABS: 10.0000 mg | ORAL_TABLET | Freq: Two times a day (BID) | ORAL | Status: DC

## 2014-08-04 MED ORDER — SERTRALINE HCL 100 MG PO TABS: ORAL_TABLET | ORAL | Status: DC

## 2014-08-04 NOTE — Progress Notes (Signed)
Patient ID: Jenna Love, female   DOB: 16-Jul-1960, 54 y.o.   MRN: [id number]   Subjective:    Patient ID: Jenna Converseathey L Love, female    DOB: 16-Jul-1960, 54 y.o.   MRN: [id number]  Patient presents for 2 month F/U  patient here to follow-up medication. At her last visit I increased her Zoloft 250 mg she's also been taking Klonopin upwards of 3 times a day especially of the past month or so. She tells me that her daughter along with her 52-year-old son has moved in with a gentleman that she thinks is selling drugs. She states that her daughter has cut off most of the family members and this has been stressing her out significantly. She is afraid of her grandson being in that atmosphere. She is planning to take her grandson this summer and he will move in with her. She's offered her daughter to move in with her as well but she has refused. This is had her neck nerves really bad therefore she has been using the Klonopin. The Zoloft does help as well. She does not want to change her medications at this time. She is also working on her weight loss. She states that using the Belviq she was down 17 pounds but over the past month gained 10 pounds back from emotional eating feeling with her daughter. She has not been exercising on a regular basis either.   No further leg swelling, has not needed lasix, 2D Echo was normal She did have GYN exam done, wants to schedule colonoscopy in the fall   Review Of Systems:  GEN- denies fatigue, fever, weight loss,weakness, recent illness HEENT- denies eye drainage, change in vision, nasal discharge, CVS- denies chest pain, palpitations RESP- denies SOB, cough, wheeze ABD- denies N/V, change in stools, abd pain GU- denies dysuria, hematuria, dribbling, incontinence MSK- denies joint pain, muscle aches, injury Neuro- denies headache, dizziness, syncope, seizure activity       Objective:    BP 136/72 mmHg  Pulse 78  Temp(Src) 98.6 F (37 C) (Oral)  Resp 16   Ht 5\' 3"  (1.6 m)  Wt 260 lb (117.935 kg)  BMI 46.07 kg/m2  LMP 05/25/2014 GEN- NAD, alert and oriented x3 HEENT- PERRL, EOMI, non injected sclera, pink conjunctiva, MMM, oropharynx clear CVS- RRR, no murmur RESP-CTAB Psych- tearful discussing daughter, not overly anxious, no SI/HI, well groomed, good eye contact EXT- No edema Pulses- Radial 2+        Assessment & Plan:      Problem List Items Addressed This Visit    None      Note: This dictation was prepared with Dragon dictation along with smaller phrase technology. Any transcriptional errors that result from this process are unintentional.

## 2014-08-04 NOTE — Assessment & Plan Note (Signed)
Continue zoloft  150mg , with recent stressors, okay to use klonopin, no more than three times a day, advised would like to cut down on this in the next few months, as highly addictive and difficult to come off of with high doses longterm

## 2014-08-04 NOTE — Patient Instructions (Addendum)
Continue current medications email when you are ready for colonoscopy F/U 3 month for weight

## 2014-08-04 NOTE — Assessment & Plan Note (Signed)
Discussed the emotional eating, very unfortunate situation with her daughter at this time, she will be getting her grandson which provides her a lot of relief, Social services is not involved.  We will plan to continue the Belviq at this time, she is down 7lbs since our last weigh in, also discussed keeping up with weight watchers

## 2014-09-07 ENCOUNTER — Encounter: Payer: Self-pay | Admitting: Family Medicine

## 2014-09-08 ENCOUNTER — Other Ambulatory Visit: Payer: Self-pay | Admitting: Family Medicine

## 2014-09-08 ENCOUNTER — Encounter: Payer: Self-pay | Admitting: Family Medicine

## 2014-09-08 ENCOUNTER — Other Ambulatory Visit: Payer: Self-pay | Admitting: *Deleted

## 2014-09-08 DIAGNOSIS — Z1211 Encounter for screening for malignant neoplasm of colon: Secondary | ICD-10-CM

## 2014-09-09 ENCOUNTER — Telehealth: Payer: Self-pay

## 2014-09-09 NOTE — Telephone Encounter (Signed)
Patient received a letter from [facility] to set up colonoscopy. Please call her back at [phone]

## 2014-09-14 NOTE — Telephone Encounter (Signed)
Per Tana Coast, PA, pt can just be triaged and scheduled. Cancelling the appt with Wynne Dust, NP on 10/05/2014.  LMOM for pt to call and complete triage and get appt for the colonoscopy.

## 2014-09-15 ENCOUNTER — Other Ambulatory Visit: Payer: Self-pay

## 2014-09-15 DIAGNOSIS — Z1211 Encounter for screening for malignant neoplasm of colon: Secondary | ICD-10-CM

## 2014-09-15 NOTE — Telephone Encounter (Signed)
I have spoke to pt and she has been scheduled for colonoscopy with Dr. Jena Gaussourk on 09/30/2014 at 10:00 AM.

## 2014-09-15 NOTE — Addendum Note (Signed)
Addended by: Lavena BullionSTEWART, Keta [place] H on: 09/15/2014 01:31 PM   Modules accepted: Medications

## 2014-09-22 ENCOUNTER — Other Ambulatory Visit: Payer: Self-pay | Admitting: Family Medicine

## 2014-09-22 NOTE — Telephone Encounter (Signed)
LRF Clonazepam 08/04/14 #90 +2  LRF Zolpidem 05/06/14 #30 +3  LOV 08/04/14  OK refill?

## 2014-09-22 NOTE — Telephone Encounter (Signed)
Okay to refill? 

## 2014-09-22 NOTE — Telephone Encounter (Signed)
Medication refilled per protocol. 

## 2014-09-22 NOTE — Telephone Encounter (Signed)
Gastroenterology Pre-Procedure Review  Request Date:09/09/2014 Requesting Physician: Dr. Jeanice Limurham  PATIENT REVIEW QUESTIONS: The patient responded to the following health history questions as indicated:    1. Diabetes Melitis: no 2. Joint replacements in the past 12 months: no 3. Major health problems in the past 3 months: no 4. Has an artificial valve or MVP: no 5. Has a defibrillator: no 6. Has been advised in past to take antibiotics in advance of a procedure like teeth cleaning: no    MEDICATIONS & ALLERGIES:    Patient reports the following regarding taking any blood thinners:   Plavix? no Aspirin? no Coumadin? no  Patient confirms/reports the following medications:  Current Outpatient Prescriptions  Medication Sig Dispense Refill  . clonazePAM (KLONOPIN) 1 MG tablet TAKE (1) TABLET BY MOUTH THREE TIMES DAILY AS NEEDED. (Patient not taking: Reported on 09/18/2014) 90 tablet 2  . furosemide (LASIX) 20 MG tablet Take 1 tablet (20 mg total) by mouth daily. (Patient not taking: Reported on 09/18/2014) 30 tablet 3  . Nutritional Supplements (JUICE PLUS FIBRE PO) Take 4 capsules by mouth daily.    . sertraline (ZOLOFT) 100 MG tablet TAKE 1 AND 1/2 TABLETS DAILY. (Patient taking differently: Take 150 mg by mouth daily. ) 45 tablet 6  . zolpidem (AMBIEN) 10 MG tablet Take 10 mg by mouth at bedtime.     No current facility-administered medications for this visit.    Patient confirms/reports the following allergies:  No Known Allergies  No orders of the defined types were placed in this encounter.    AUTHORIZATION INFORMATION Primary Insurance:   ID #: Group #:  Pre-Cert / Auth required:  Pre-Cert / Auth #:   Secondary Insurance:   ID #:  Group #:  Pre-Cert / Auth required: Pre-Cert / Auth #:   SCHEDULE INFORMATION: Procedure has been scheduled as follows:  Date:   09/30/2014                Time:  10:00 Am Location: [facility] Short Stay  This Gastroenterology  Pre-Precedure Review Form is being routed to the following provider(s): R. Roetta SessionsMichael Rourk, MD

## 2014-09-22 NOTE — Telephone Encounter (Signed)
Pt is scheduled for OV with Tana CoastLeslie Lewis, PA on 10/13/2014 at 8:00 AM. She is aware and Eber JonesCarolyn in endo is aware the procedure has been cancelled for 09/30/2014.

## 2014-09-22 NOTE — Telephone Encounter (Signed)
Would recommend OV due to meds. (klonopin, ambien, and Zoloft).

## 2014-09-30 ENCOUNTER — Encounter (HOSPITAL_COMMUNITY): Admission: RE | Payer: Self-pay | Source: Ambulatory Visit

## 2014-09-30 ENCOUNTER — Ambulatory Visit (HOSPITAL_COMMUNITY)
Admission: RE | Admit: 2014-09-30 | Payer: PRIVATE HEALTH INSURANCE | Source: Ambulatory Visit | Admitting: Internal Medicine

## 2014-09-30 SURGERY — COLONOSCOPY
Anesthesia: Moderate Sedation

## 2014-10-05 ENCOUNTER — Ambulatory Visit: Payer: PRIVATE HEALTH INSURANCE | Admitting: Nurse Practitioner

## 2014-10-13 ENCOUNTER — Encounter: Payer: Self-pay | Admitting: Gastroenterology

## 2014-10-13 ENCOUNTER — Ambulatory Visit (INDEPENDENT_AMBULATORY_CARE_PROVIDER_SITE_OTHER): Payer: PRIVATE HEALTH INSURANCE | Admitting: Gastroenterology

## 2014-10-13 ENCOUNTER — Other Ambulatory Visit: Payer: Self-pay

## 2014-10-13 VITALS — BP 142/87 | HR 110 | Temp 97.9°F | Ht 63.0 in | Wt 263.8 lb

## 2014-10-13 DIAGNOSIS — Z79899 Other long term (current) drug therapy: Secondary | ICD-10-CM | POA: Diagnosis not present

## 2014-10-13 DIAGNOSIS — Z1211 Encounter for screening for malignant neoplasm of colon: Secondary | ICD-10-CM | POA: Diagnosis not present

## 2014-10-13 MED ORDER — PEG-KCL-NACL-NASULF-NA ASC-C 100 G PO SOLR: 1.0000 | Freq: Once | ORAL | Status: AC

## 2014-10-13 NOTE — Patient Instructions (Signed)
1. Colonoscopy as scheduled. See separate instructions.  

## 2014-10-13 NOTE — Progress Notes (Signed)
Primary Care Physician:  Milinda Antis, MD  Primary Gastroenterologist:  Roetta Sessions, MD   Chief Complaint  Patient presents with  . Colonoscopy    HPI:  Jenna Love is a 54 y.o. female here to schedule first ever colonoscopy. Because of polypharmacy, she was brought in to discuss modes of sedation. She is doing well. No constipation or diarrhea. Daily bowel movement. No melena rectal bleeding. Denies abdominal pain, heartburn, vomiting. No family history of colon cancer.  Current Outpatient Prescriptions  Medication Sig Dispense Refill  . clonazePAM (KLONOPIN) 1 MG tablet TAKE (1) TABLET BY MOUTH THREE TIMES DAILY AS NEEDED FOR ANXIETY. 90 tablet 0  . sertraline (ZOLOFT) 100 MG tablet TAKE 1 AND 1/2 TABLETS DAILY. (Patient taking differently: Take 150 mg by mouth daily. ) 45 tablet 6  . zolpidem (AMBIEN) 10 MG tablet TAKE ONE TABLET BY MOUTH AT BEDTIME AS NEEDED. 30 tablet 0   No current facility-administered medications for this visit.    Allergies as of 10/13/2014  . (No Known Allergies)    Past Medical History  Diagnosis Date  . Anxiety   . Panic attacks   . Thyroid nodule 2013    Benign biopsy- ENT Dr. Pollyann Kennedy  . Pneumonia     Past Surgical History  Procedure Laterality Date  . Cesarean section  1992  . Hysteroscopy w/d&c N/A 10/03/2013    Procedure: DILATATION AND CURETTAGE /HYSTEROSCOPY;  Surgeon: Tilda Burrow, MD;  Location: [facility];  Service: Gynecology;  Laterality: N/A;  . Polypectomy N/A 10/03/2013    Procedure: REMOVAL OF ENDOMETRIAL POLYP;  Surgeon: Tilda Burrow, MD;  Location: [facility];  Service: Gynecology;  Laterality: N/A;    Family History  Problem Relation Age of Onset  . Diabetes Mother   . Alzheimer's disease Mother   . Cancer Father     lung   . Colon cancer Neg Hx     History   Social History  . Marital Status: Widowed    Spouse Name: N/A  . Number of Children: N/A  . Years of Education: N/A   Occupational History  . Not on  file.   Social History Main Topics  . Smoking status: Never Smoker   . Smokeless tobacco: Never Used  . Alcohol Use: No  . Drug Use: No  . Sexual Activity: Yes    Birth Control/ Protection: None   Other Topics Concern  . Not on file   Social History Narrative      ROS:  General: Negative for anorexia, weight loss, fever, chills, fatigue, weakness. Eyes: Negative for vision changes.  ENT: Negative for hoarseness, difficulty swallowing , nasal congestion. CV: Negative for chest pain, angina, palpitations, dyspnea on exertion, peripheral edema.  Respiratory: Negative for dyspnea at rest, dyspnea on exertion, cough, sputum, wheezing.  GI: See history of present illness. GU:  Negative for dysuria, hematuria, urinary incontinence, urinary frequency, nocturnal urination.  MS: Negative for joint pain, low back pain.  Derm: Negative for rash or itching.  Neuro: Negative for weakness, abnormal sensation, seizure, frequent headaches, memory loss, confusion.  Psych: Negative for anxiety, depression, suicidal ideation, hallucinations.  Endo: Negative for unusual weight change.  Heme: Negative for bruising or bleeding. Allergy: Negative for rash or hives.    Physical Examination:  BP 142/87 mmHg  Pulse 110  Temp(Src) 97.9 F (36.6 C) (Oral)  Ht  (1.6 m)  Wt 263 lb 12.8 oz (119.659 kg)  BMI 46.74 kg/m2   General: Well-nourished,  well-developed in no acute distress.  Head: Normocephalic, atraumatic.   Eyes: Conjunctiva pink, no icterus. Mouth: Oropharyngeal mucosa moist and pink , no lesions erythema or exudate. Neck: Supple without thyromegaly, masses, or lymphadenopathy.  Lungs: Clear to auscultation bilaterally.  Heart: Regular rate and rhythm, no murmurs rubs or gallops.  Abdomen: Bowel sounds are normal, nontender, nondistended, no hepatosplenomegaly or masses, no abdominal bruits or    hernia , no rebound or guarding.   Rectal: Deferred Extremities: No lower extremity  edema. No clubbing or deformities.  Neuro: Alert and oriented x 4 , grossly normal neurologically.  Skin: Warm and dry, no rash or jaundice.   Psych: Alert and cooperative, normal mood and affect.  Imaging Studies: No results found.

## 2014-10-13 NOTE — Assessment & Plan Note (Signed)
54 year old female presents for first ever screening colonoscopy. Due to polypharmacy, deep sedation in the OR recommended.  I have discussed the risks, alternatives, benefits with regards to but not limited to the risk of reaction to medication, bleeding, infection, perforation and the patient is agreeable to proceed. Written consent to be obtained.

## 2014-10-20 NOTE — Progress Notes (Signed)
CC'ED TO PCP 

## 2014-10-21 ENCOUNTER — Other Ambulatory Visit: Payer: Self-pay | Admitting: Family Medicine

## 2014-10-21 MED ORDER — CLONAZEPAM 1 MG PO TABS: 1.0000 mg | ORAL_TABLET | Freq: Three times a day (TID) | ORAL | Status: DC | PRN

## 2014-10-21 MED ORDER — ZOLPIDEM TARTRATE 10 MG PO TABS: 10.0000 mg | ORAL_TABLET | Freq: Every evening | ORAL | Status: DC | PRN

## 2014-10-30 NOTE — Patient Instructions (Signed)
Your procedure is scheduled on: 11/05/2014  Report to [facility] at  7:30   AM.  Call this number if you have problems the morning of surgery: [phone]   Remember:   Do not drink or eat food:After Midnight.  :  Take these medicines the morning of surgery with A SIP OF WATER: Klonopin and Zoloft   Do not wear jewelry, make-up or nail polish.  Do not wear lotions, powders, or perfumes. You may wear deodorant.  Do not shave 48 hours prior to surgery. Men may shave face and neck.  Do not bring valuables to the hospital.  Contacts, dentures or bridgework may not be worn into surgery.  Leave suitcase in the car. After surgery it may be brought to your room.  For patients admitted to the hospital, checkout time is 11:00 AM the day of discharge.   Patients discharged the day of surgery will not be allowed to drive home.    Colonoscopy, Care After Refer to this sheet in the next few weeks. These instructions provide you with information on caring for yourself after your procedure. Your health care provider may also give you more specific instructions. Your treatment has been planned according to current medical practices, but problems sometimes occur. Call your health care provider if you have any problems or questions after your procedure. WHAT TO EXPECT AFTER THE PROCEDURE  After your procedure, it is typical to have the following:  A small amount of blood in your stool.  Moderate amounts of gas and mild abdominal cramping or bloating. HOME CARE INSTRUCTIONS  Do not drive, operate machinery, or sign important documents for 24 hours.  You may shower and resume your regular physical activities, but move at a slower pace for the first 24 hours.  Take frequent rest periods for the first 24 hours.  Walk around or put a warm pack on your abdomen to help reduce abdominal cramping and bloating.  Drink enough fluids to keep your urine clear or pale yellow.  You may resume your normal  diet as instructed by your health care provider. Avoid heavy or fried foods that are hard to digest.  Avoid drinking alcohol for 24 hours or as instructed by your health care provider.  Only take over-the-counter or prescription medicines as directed by your health care provider.  If a tissue sample (biopsy) was taken during your procedure:  Do not take aspirin or blood thinners for 7 days, or as instructed by your health care provider.  Do not drink alcohol for 7 days, or as instructed by your health care provider.  Eat soft foods for the first 24 hours. SEEK MEDICAL CARE IF: You have persistent spotting of blood in your stool 2-3 days after the procedure. SEEK IMMEDIATE MEDICAL CARE IF:  You have more than a small spotting of blood in your stool.  You pass large blood clots in your stool.  Your abdomen is swollen (distended).  You have nausea or vomiting.  You have a fever.  You have increasing abdominal pain that is not relieved with medicine. Document Released: 10/19/2003 Document Revised: 12/25/2012 Document Reviewed: 11/11/2012 [facility] Patient Information 2015 [place], [place]. This information is not intended to replace advice given to you by your health care provider. Make sure you discuss any questions you have with your health care provider. Monitored Anesthesia Care Monitored anesthesia care is an anesthesia service for a medical procedure. Anesthesia is the loss of the ability to feel pain. It is produced by  medicines called anesthetics. It may affect a small area of your body (local anesthesia), a large area of your body (regional anesthesia), or your entire body (general anesthesia). The need for monitored anesthesia care depends your procedure, your condition, and the potential need for regional or general anesthesia. It is often provided during procedures where:   General anesthesia may be needed if there are complications. This is because you need special care when  you are under general anesthesia.   You will be under local or regional anesthesia. This is so that you are able to have higher levels of anesthesia if needed.   You will receive calming medicines (sedatives). This is especially the case if sedatives are given to put you in a semi-conscious state of relaxation (deep sedation). This is because the amount of sedative needed to produce this state can be hard to predict. Too much of a sedative can produce general anesthesia. Monitored anesthesia care is performed by one or more health care providers who have special training in all types of anesthesia. You will need to meet with these health care providers before your procedure. During this meeting, they will ask you about your medical history. They will also give you instructions to follow. (For example, you will need to stop eating and drinking before your procedure. You may also need to stop or change medicines you are taking.) During your procedure, your health care providers will stay with you. They will:   Watch your condition. This includes watching your blood pressure, breathing, and level of pain.   Diagnose and treat problems that occur.   Give medicines if they are needed. These may include calming medicines (sedatives) and anesthetics.   Make sure you are comfortable.  Having monitored anesthesia care does not necessarily mean that you will be under anesthesia. It does mean that your health care providers will be able to manage anesthesia if you need it or if it occurs. It also means that you will be able to have a different type of anesthesia than you are having if you need it. When your procedure is complete, your health care providers will continue to watch your condition. They will make sure any medicines wear off before you are allowed to go home.  Document Released: 11/30/2004 Document Revised: 07/21/2013 Document Reviewed: 04/17/2012 [facility] Patient Information 2015 [place],  [place]. This information is not intended to replace advice given to you by your health care provider. Make sure you discuss any questions you have with your health care provider.

## 2014-11-02 ENCOUNTER — Encounter (HOSPITAL_COMMUNITY): Payer: Self-pay

## 2014-11-02 ENCOUNTER — Encounter (HOSPITAL_COMMUNITY)
Admission: RE | Admit: 2014-11-02 | Discharge: 2014-11-02 | Disposition: A | Discharge status: Home / Self Care | Payer: PRIVATE HEALTH INSURANCE | Source: Ambulatory Visit | Attending: Internal Medicine | Admitting: Internal Medicine

## 2014-11-02 ENCOUNTER — Encounter: Payer: Self-pay | Admitting: Family Medicine

## 2014-11-02 DIAGNOSIS — K648 Other hemorrhoids: Secondary | ICD-10-CM | POA: Diagnosis not present

## 2014-11-02 DIAGNOSIS — Z801 Family history of malignant neoplasm of trachea, bronchus and lung: Secondary | ICD-10-CM | POA: Diagnosis not present

## 2014-11-02 DIAGNOSIS — Z6841 Body Mass Index (BMI) 40.0 and over, adult: Secondary | ICD-10-CM | POA: Diagnosis not present

## 2014-11-02 DIAGNOSIS — Z833 Family history of diabetes mellitus: Secondary | ICD-10-CM | POA: Diagnosis not present

## 2014-11-02 DIAGNOSIS — Q438 Other specified congenital malformations of intestine: Secondary | ICD-10-CM | POA: Diagnosis not present

## 2014-11-02 DIAGNOSIS — F419 Anxiety disorder, unspecified: Secondary | ICD-10-CM | POA: Diagnosis not present

## 2014-11-02 DIAGNOSIS — K635 Polyp of colon: Secondary | ICD-10-CM | POA: Diagnosis not present

## 2014-11-02 DIAGNOSIS — Z1211 Encounter for screening for malignant neoplasm of colon: Secondary | ICD-10-CM | POA: Diagnosis not present

## 2014-11-02 DIAGNOSIS — Z01812 Encounter for preprocedural laboratory examination: Secondary | ICD-10-CM | POA: Diagnosis not present

## 2014-11-02 DIAGNOSIS — Z79899 Other long term (current) drug therapy: Secondary | ICD-10-CM | POA: Diagnosis not present

## 2014-11-02 DIAGNOSIS — F410 Panic disorder [episodic paroxysmal anxiety] without agoraphobia: Secondary | ICD-10-CM | POA: Diagnosis not present

## 2014-11-02 LAB — BASIC METABOLIC PANEL
ANION GAP: 7 (ref 5–15)
BUN: 13 mg/dL (ref 6–20)
CHLORIDE: 104 mmol/L (ref 101–111)
CO2: 25 mmol/L (ref 22–32)
Calcium: 9.1 mg/dL (ref 8.9–10.3)
Creatinine, Ser: 0.79 mg/dL (ref 0.44–1.00)
GFR calc Af Amer: >60 mL/min (ref >60)
GFR calc non Af Amer: >60 mL/min (ref >60)
Glucose, Bld: 102 mg/dL — ABNORMAL HIGH (ref 65–99)
POTASSIUM: 4.1 mmol/L (ref 3.5–5.1)
SODIUM: 136 mmol/L (ref 135–145)

## 2014-11-02 LAB — HCG, SERUM, QUALITATIVE: Preg, Serum: NEGATIVE

## 2014-11-04 ENCOUNTER — Ambulatory Visit: Payer: PRIVATE HEALTH INSURANCE | Admitting: Family Medicine

## 2014-11-04 NOTE — Progress Notes (Signed)
Patient ID: Jenna Love, female   DOB: 07-Dec-1960, 54 y.o.   MRN: [id number] PA # for [facility] is [id number]

## 2014-11-05 ENCOUNTER — Ambulatory Visit (HOSPITAL_COMMUNITY)
Admission: RE | Admit: 2014-11-05 | Discharge: 2014-11-05 | Disposition: A | Discharge status: Home / Self Care | Payer: PRIVATE HEALTH INSURANCE | Source: Ambulatory Visit | Attending: Internal Medicine | Admitting: Internal Medicine

## 2014-11-05 ENCOUNTER — Ambulatory Visit: Admit: 2014-11-05 | Payer: Self-pay | Admitting: Internal Medicine

## 2014-11-05 ENCOUNTER — Ambulatory Visit (HOSPITAL_COMMUNITY): Payer: PRIVATE HEALTH INSURANCE | Admitting: Anesthesiology

## 2014-11-05 ENCOUNTER — Encounter (HOSPITAL_COMMUNITY)
Admission: RE | Disposition: A | Discharge status: Home / Self Care | Payer: Self-pay | Source: Ambulatory Visit | Attending: Internal Medicine

## 2014-11-05 ENCOUNTER — Encounter (HOSPITAL_COMMUNITY): Payer: Self-pay | Admitting: *Deleted

## 2014-11-05 DIAGNOSIS — Z833 Family history of diabetes mellitus: Secondary | ICD-10-CM | POA: Insufficient documentation

## 2014-11-05 DIAGNOSIS — Z860100 Personal history of colon polyps, unspecified: Secondary | ICD-10-CM | POA: Insufficient documentation

## 2014-11-05 DIAGNOSIS — Q438 Other specified congenital malformations of intestine: Secondary | ICD-10-CM | POA: Insufficient documentation

## 2014-11-05 DIAGNOSIS — Z1211 Encounter for screening for malignant neoplasm of colon: Secondary | ICD-10-CM | POA: Insufficient documentation

## 2014-11-05 DIAGNOSIS — Z79899 Other long term (current) drug therapy: Secondary | ICD-10-CM | POA: Insufficient documentation

## 2014-11-05 DIAGNOSIS — F419 Anxiety disorder, unspecified: Secondary | ICD-10-CM | POA: Insufficient documentation

## 2014-11-05 DIAGNOSIS — Z6841 Body Mass Index (BMI) 40.0 and over, adult: Secondary | ICD-10-CM | POA: Insufficient documentation

## 2014-11-05 DIAGNOSIS — K635 Polyp of colon: Secondary | ICD-10-CM | POA: Diagnosis not present

## 2014-11-05 DIAGNOSIS — Z801 Family history of malignant neoplasm of trachea, bronchus and lung: Secondary | ICD-10-CM | POA: Insufficient documentation

## 2014-11-05 DIAGNOSIS — K648 Other hemorrhoids: Secondary | ICD-10-CM | POA: Insufficient documentation

## 2014-11-05 DIAGNOSIS — Z01812 Encounter for preprocedural laboratory examination: Secondary | ICD-10-CM | POA: Insufficient documentation

## 2014-11-05 DIAGNOSIS — F410 Panic disorder [episodic paroxysmal anxiety] without agoraphobia: Secondary | ICD-10-CM | POA: Insufficient documentation

## 2014-11-05 HISTORY — PX: COLONOSCOPY WITH PROPOFOL: SHX5780

## 2014-11-05 HISTORY — PX: POLYPECTOMY: SHX5525

## 2014-11-05 LAB — POCT I-STAT 4, (NA,K, GLUC, HGB,HCT)
Glucose, Bld: 111 mg/dL — ABNORMAL HIGH (ref 65–99)
HCT: 44 % (ref 36.0–46.0)
HEMOGLOBIN: 15 g/dL (ref 12.0–15.0)
POTASSIUM: 3.7 mmol/L (ref 3.5–5.1)
SODIUM: 140 mmol/L (ref 135–145)

## 2014-11-05 SURGERY — COLONOSCOPY
Anesthesia: Monitor Anesthesia Care

## 2014-11-05 SURGERY — COLONOSCOPY WITH PROPOFOL
Anesthesia: Monitor Anesthesia Care | Site: Rectum

## 2014-11-05 MED ORDER — FENTANYL CITRATE (PF) 100 MCG/2ML IJ SOLN: 25.0000 ug | INTRAMUSCULAR | Status: DC | PRN

## 2014-11-05 MED ORDER — LIDOCAINE HCL 1 % IJ SOLN
INTRAMUSCULAR | Status: DC | PRN
  Administered 2014-11-05: 30 mg via INTRADERMAL

## 2014-11-05 MED ORDER — MIDAZOLAM HCL 5 MG/5ML IJ SOLN
INTRAMUSCULAR | Status: DC | PRN
  Administered 2014-11-05: 2 mg via INTRAVENOUS

## 2014-11-05 MED ORDER — PROPOFOL 10 MG/ML IV BOLUS
INTRAVENOUS | Status: AC
  Filled 2014-11-05: qty 20

## 2014-11-05 MED ORDER — ONDANSETRON HCL 4 MG/2ML IJ SOLN
4.0000 mg | Freq: Once | INTRAMUSCULAR | Status: AC
  Administered 2014-11-05: 4 mg via INTRAVENOUS
  Filled 2014-11-05: qty 2

## 2014-11-05 MED ORDER — WATER FOR IRRIGATION, STERILE IR SOLN
Status: DC | PRN
  Administered 2014-11-05: 1000 mL

## 2014-11-05 MED ORDER — FENTANYL CITRATE (PF) 100 MCG/2ML IJ SOLN
INTRAMUSCULAR | Status: AC
  Filled 2014-11-05: qty 2

## 2014-11-05 MED ORDER — FENTANYL CITRATE (PF) 100 MCG/2ML IJ SOLN
25.0000 ug | INTRAMUSCULAR | Status: AC
  Administered 2014-11-05 (×2): 25 ug via INTRAVENOUS

## 2014-11-05 MED ORDER — LACTATED RINGERS IV SOLN
INTRAVENOUS | Status: DC
  Administered 2014-11-05: 1000 mL via INTRAVENOUS

## 2014-11-05 MED ORDER — MIDAZOLAM HCL 2 MG/2ML IJ SOLN
1.0000 mg | INTRAMUSCULAR | Status: DC | PRN
  Administered 2014-11-05 (×2): 2 mg via INTRAVENOUS
  Filled 2014-11-05 (×2): qty 2

## 2014-11-05 MED ORDER — PROPOFOL INFUSION 10 MG/ML OPTIME
INTRAVENOUS | Status: DC | PRN
  Administered 2014-11-05: 150 ug/kg/min via INTRAVENOUS
  Administered 2014-11-05 (×2): via INTRAVENOUS

## 2014-11-05 MED ORDER — STERILE WATER FOR IRRIGATION IR SOLN
Status: DC | PRN
  Administered 2014-11-05: 09:00:00

## 2014-11-05 MED ORDER — ONDANSETRON HCL 4 MG/2ML IJ SOLN: 4.0000 mg | Freq: Once | INTRAMUSCULAR | Status: DC | PRN

## 2014-11-05 MED ORDER — GLYCOPYRROLATE 0.2 MG/ML IJ SOLN
0.2000 mg | Freq: Once | INTRAMUSCULAR | Status: AC
  Administered 2014-11-05: 0.2 mg via INTRAVENOUS
  Filled 2014-11-05: qty 1

## 2014-11-05 MED ORDER — LIDOCAINE HCL (PF) 1 % IJ SOLN
INTRAMUSCULAR | Status: AC
  Filled 2014-11-05: qty 5

## 2014-11-05 MED ORDER — MIDAZOLAM HCL 2 MG/2ML IJ SOLN
INTRAMUSCULAR | Status: AC
  Filled 2014-11-05: qty 4

## 2014-11-05 SURGICAL SUPPLY — 21 items
ELECT REM PT RETURN 9FT ADLT (ELECTROSURGICAL)
ELECTRODE REM PT RTRN 9FT ADLT (ELECTROSURGICAL) IMPLANT
FCP BXJMBJMB 240X2.8X (CUTTING FORCEPS)
FORCEPS BIOP RAD 4 LRG CAP 4 (CUTTING FORCEPS) ×2 IMPLANT
FORCEPS BIOP RJ4 240 W/NDL (CUTTING FORCEPS)
FORCEPS BXJMBJMB 240X2.8X (CUTTING FORCEPS) IMPLANT
FORMALIN 10 PREFIL 20ML (MISCELLANEOUS) IMPLANT
INJECTOR/SNARE I SNARE (MISCELLANEOUS) ×2 IMPLANT
KIT ENDO PROCEDURE PEN (KITS) ×3 IMPLANT
MANIFOLD NEPTUNE II (INSTRUMENTS) ×2 IMPLANT
NDL SCLEROTHERAPY 25GX240 (NEEDLE) IMPLANT
NEEDLE SCLEROTHERAPY 25GX240 (NEEDLE) IMPLANT
PROBE APC STR FIRE (PROBE) IMPLANT
PROBE INJECTION GOLD (MISCELLANEOUS)
PROBE INJECTION GOLD 7FR (MISCELLANEOUS) IMPLANT
SNARE ROTATE MED OVAL 20MM (MISCELLANEOUS) IMPLANT
SNARE SHORT THROW 13M SML OVAL (MISCELLANEOUS) IMPLANT
SYR 50ML LL SCALE MARK (SYRINGE) ×2 IMPLANT
TRAP SPECIMEN MUCOUS 40CC (MISCELLANEOUS) ×4 IMPLANT
TUBING IRRIGATION ENDOGATOR (MISCELLANEOUS) ×2 IMPLANT
WATER STERILE IRR 1000ML POUR (IV SOLUTION) ×2 IMPLANT

## 2014-11-05 NOTE — Anesthesia Preprocedure Evaluation (Addendum)
Anesthesia Evaluation  Patient identified by MRN, date of birth, ID band Patient awake    Reviewed: Allergy & Precautions, H&P , NPO status , Patient's Chart, lab work & pertinent test results  Airway Mallampati: II  TM Distance: >3 FB Neck ROM: Full    Dental  (+) Teeth Intact   Pulmonary shortness of breath and with exertion,  breath sounds clear to auscultation        Cardiovascular negative cardio ROS  Rhythm:Regular Rate:Normal     Neuro/Psych PSYCHIATRIC DISORDERS (panic attacks) Anxiety Depression    GI/Hepatic negative GI ROS,   Endo/Other  Morbid obesityBenign thyroid nodule   Renal/GU      Musculoskeletal   Abdominal   Peds  Hematology   Anesthesia Other Findings   Reproductive/Obstetrics                             Anesthesia Physical Anesthesia Plan  ASA: II  Anesthesia Plan: MAC   Post-op Pain Management:    Induction: Intravenous  Airway Management Planned: Simple Face Mask  Additional Equipment:   Intra-op Plan:   Post-operative Plan:   Informed Consent: I have reviewed the patients History and Physical, chart, labs and discussed the procedure including the risks, benefits and alternatives for the proposed anesthesia with the patient or authorized representative who has indicated his/her understanding and acceptance.     Plan Discussed with:   Anesthesia Plan Comments: (Possible GOT discussed and she agrees.)        Anesthesia Quick Evaluation

## 2014-11-05 NOTE — Op Note (Signed)
[facility] [address] [place], [address]   COLONOSCOPY PROCEDURE REPORT  PATIENT: Jenna Love, Jenna Love  MR#: [id number] BIRTHDATE: 24-Sep-1960 , 53  yrs. old GENDER: female ENDOSCOPIST: R.  Roetta Sessions, MD FACP [facility] REFERRED BM:WUXLKGM Talty, M.D. PROCEDURE DATE:  2014/11/19 PROCEDURE:   Colonoscopy with snare polypectomy INDICATIONS:First-ever average risk screening examination. MEDICATIONS: Deep sedation per Dr.  [facility] CLASS:       Class II  CONSENT: The risks, benefits, alternatives and imponderables including but not limited to bleeding, perforation as well as the possibility of a missed lesion have been reviewed.  The potential for biopsy, lesion removal, etc. have also been discussed. Questions have been answered.  All parties agreeable.  Please see the history and physical in the medical record for more information.  DESCRIPTION OF PROCEDURE:   After the risks benefits and alternatives of the procedure were thoroughly explained, informed consent was obtained.  The digital rectal exam revealed no abnormalities of the rectum.   The     endoscope was introduced through the anus and advanced to the cecum, which was identified by both the appendix and ileocecal valve. No adverse events experienced.   The quality of the prep was adequate  The instrument was then slowly withdrawn as the colon was fully examined. Estimated blood loss is zero unless otherwise noted in this procedure report.      COLON FINDINGS: Prominent internal hemorrhoids; otherwise, normal-appearing rectal mucosa.  Elongated redundant colon. Patient required changing of the her position and external abdominal pressure to reach the cecum.  The patient had (1) 5 mm sessile polyp in the base of the cecum; otherwise, the remainder of the colonic mucosa.  Normal.  The above-mentioned problems: Snare removed and recovered for the pathologist.  Retroflexion was  performed. .  Withdrawal time=18 minutes 0 seconds.  The scope was withdrawn and the procedure completed. COMPLICATIONS: There were no immediate complications.  ENDOSCOPIC IMPRESSION: Redundant, elongated colon. Single colonic polyp?"removed as described above  RECOMMENDATIONS: Follow-up on pathology.  eSigned:  R. Roetta Sessions, MD Jerrel [facility]ory [facility] November 19, 2014 9:13 AM   cc:  CPT CODES: ICD CODES:  The ICD and CPT codes recommended by this software are interpretations from the data that the clinical staff has captured with the software.  The verification of the translation of this report to the ICD and CPT codes and modifiers is the sole responsibility of the health care institution and practicing physician where this report was generated.  [employer] will not be held responsible for the validity of the ICD and CPT codes included on this report.  AMA assumes no liability for data contained or not contained herein. CPT is a Publishing rights manager of the [employer].  PATIENT NAME:  [name], [name] MR#: [id number]

## 2014-11-05 NOTE — Transfer of Care (Signed)
Immediate Anesthesia Transfer of Care Note  Patient: Jenna Love  Procedure(s) Performed: Procedure(s) with comments: COLONOSCOPY WITH PROPOFOL (N/A) - in cecum at 0820, 18 minutes total withdrawel time POLYPECTOMY (N/A)  Patient Location: PACU  Anesthesia Type:MAC  Level of Consciousness: awake and patient cooperative  Airway & Oxygen Therapy: Patient Spontanous Breathing and Patient connected to face mask oxygen  Post-op Assessment: Report given to RN, Post -op Vital signs reviewed and stable and Patient moving all extremities  Post vital signs: Reviewed and stable  Last Vitals:  Filed Vitals:   11/05/14 0800  BP: 122/79  Pulse:   Temp:   Resp: 13    Complications: No apparent anesthesia complications

## 2014-11-05 NOTE — H&P (View-Only) (Signed)
Primary Care Physician:  Milinda Antis, MD  Primary Gastroenterologist:  Roetta Sessions, MD   Chief Complaint  Patient presents with  . Colonoscopy    HPI:  Jenna Love is a 54 y.o. female here to schedule first ever colonoscopy. Because of polypharmacy, she was brought in to discuss modes of sedation. She is doing well. No constipation or diarrhea. Daily bowel movement. No melena rectal bleeding. Denies abdominal pain, heartburn, vomiting. No family history of colon cancer.  Current Outpatient Prescriptions  Medication Sig Dispense Refill  . clonazePAM (KLONOPIN) 1 MG tablet TAKE (1) TABLET BY MOUTH THREE TIMES DAILY AS NEEDED FOR ANXIETY. 90 tablet 0  . sertraline (ZOLOFT) 100 MG tablet TAKE 1 AND 1/2 TABLETS DAILY. (Patient taking differently: Take 150 mg by mouth daily. ) 45 tablet 6  . zolpidem (AMBIEN) 10 MG tablet TAKE ONE TABLET BY MOUTH AT BEDTIME AS NEEDED. 30 tablet 0   No current facility-administered medications for this visit.    Allergies as of 10/13/2014  . (No Known Allergies)    Past Medical History  Diagnosis Date  . Anxiety   . Panic attacks   . Thyroid nodule 2013    Benign biopsy- ENT Dr. Pollyann Kennedy  . Pneumonia     Past Surgical History  Procedure Laterality Date  . Cesarean section  1992  . Hysteroscopy w/d&c N/A 10/03/2013    Procedure: DILATATION AND CURETTAGE /HYSTEROSCOPY;  Surgeon: Tilda Burrow, MD;  Location: [facility];  Service: Gynecology;  Laterality: N/A;  . Polypectomy N/A 10/03/2013    Procedure: REMOVAL OF ENDOMETRIAL POLYP;  Surgeon: Tilda Burrow, MD;  Location: [facility];  Service: Gynecology;  Laterality: N/A;    Family History  Problem Relation Age of Onset  . Diabetes Mother   . Alzheimer's disease Mother   . Cancer Father     lung   . Colon cancer Neg Hx     History   Social History  . Marital Status: Widowed    Spouse Name: N/A  . Number of Children: N/A  . Years of Education: N/A   Occupational History  . Not on  file.   Social History Main Topics  . Smoking status: Never Smoker   . Smokeless tobacco: Never Used  . Alcohol Use: No  . Drug Use: No  . Sexual Activity: Yes    Birth Control/ Protection: None   Other Topics Concern  . Not on file   Social History Narrative      ROS:  General: Negative for anorexia, weight loss, fever, chills, fatigue, weakness. Eyes: Negative for vision changes.  ENT: Negative for hoarseness, difficulty swallowing , nasal congestion. CV: Negative for chest pain, angina, palpitations, dyspnea on exertion, peripheral edema.  Respiratory: Negative for dyspnea at rest, dyspnea on exertion, cough, sputum, wheezing.  GI: See history of present illness. GU:  Negative for dysuria, hematuria, urinary incontinence, urinary frequency, nocturnal urination.  MS: Negative for joint pain, low back pain.  Derm: Negative for rash or itching.  Neuro: Negative for weakness, abnormal sensation, seizure, frequent headaches, memory loss, confusion.  Psych: Negative for anxiety, depression, suicidal ideation, hallucinations.  Endo: Negative for unusual weight change.  Heme: Negative for bruising or bleeding. Allergy: Negative for rash or hives.    Physical Examination:  BP 142/87 mmHg  Pulse 110  Temp(Src) 97.9 F (36.6 C) (Oral)  Ht  (1.6 m)  Wt 263 lb 12.8 oz (119.659 kg)  BMI 46.74 kg/m2   General: Well-nourished,  well-developed in no acute distress.  Head: Normocephalic, atraumatic.   Eyes: Conjunctiva pink, no icterus. Mouth: Oropharyngeal mucosa moist and pink , no lesions erythema or exudate. Neck: Supple without thyromegaly, masses, or lymphadenopathy.  Lungs: Clear to auscultation bilaterally.  Heart: Regular rate and rhythm, no murmurs rubs or gallops.  Abdomen: Bowel sounds are normal, nontender, nondistended, no hepatosplenomegaly or masses, no abdominal bruits or    hernia , no rebound or guarding.   Rectal: Deferred Extremities: No lower extremity  edema. No clubbing or deformities.  Neuro: Alert and oriented x 4 , grossly normal neurologically.  Skin: Warm and dry, no rash or jaundice.   Psych: Alert and cooperative, normal mood and affect.  Imaging Studies: No results found.

## 2014-11-05 NOTE — Discharge Instructions (Signed)
°Polyp information provided ° °Further recommendations to follow pending review of pathology report °Colonoscopy °Discharge Instructions ° °Read the instructions outlined below and refer to this sheet in the next few weeks. These discharge instructions provide you with general information on caring for yourself after you leave the hospital. Your doctor may also give you specific instructions. While your treatment has been planned according to the most current medical practices available, unavoidable complications occasionally occur. If you have any problems or questions after discharge, call Dr. Rourk at [phone]. °ACTIVITY °· You may resume your regular activity, but move at a slower pace for the next 24 hours.  °· Take frequent rest periods for the next 24 hours.  °· Walking will help get rid of the air and reduce the bloated feeling in your belly (abdomen).  °· No driving for 24 hours (because of the medicine (anesthesia) used during the test).   °· Do not sign any important legal documents or operate any machinery for 24 hours (because of the anesthesia used during the test).  °NUTRITION °· Drink plenty of fluids.  °· You may resume your normal diet as instructed by your doctor.  °· Begin with a light meal and progress to your normal diet. Heavy or fried foods are harder to digest and may make you feel sick to your stomach (nauseated).  °· Avoid alcoholic beverages for 24 hours or as instructed.  °MEDICATIONS °· You may resume your normal medications unless your doctor tells you otherwise.  °WHAT YOU CAN EXPECT TODAY °· Some feelings of bloating in the abdomen.  °· Passage of more gas than usual.  °· Spotting of blood in your stool or on the toilet paper.  °IF YOU HAD POLYPS REMOVED DURING THE COLONOSCOPY: °· No aspirin products for 7 days or as instructed.  °· No alcohol for 7 days or as instructed.  °· Eat a soft diet for the next 24 hours.  °FINDING OUT THE RESULTS OF YOUR TEST °Not all test results are  available during your visit. If your test results are not back during the visit, make an appointment with your caregiver to find out the results. Do not assume everything is normal if you have not heard from your caregiver or the medical facility. It is important for you to follow up on all of your test results.  °SEEK IMMEDIATE MEDICAL ATTENTION IF: °· You have more than a spotting of blood in your stool.  °· Your belly is swollen (abdominal distention).  °· You are nauseated or vomiting.  °· You have a temperature over 101.  °· You have abdominal pain or discomfort that is severe or gets worse throughout the day.  ° °Colon Polyps °Polyps are lumps of extra tissue growing inside the body. Polyps can grow in the large intestine (colon). Most colon polyps are noncancerous (benign). However, some colon polyps can become cancerous over time. Polyps that are larger than a pea may be harmful. To be safe, caregivers remove and test all polyps. °CAUSES  °Polyps form when mutations in the genes cause your cells to grow and divide even though no more tissue is needed. °RISK FACTORS °There are a number of risk factors that can increase your chances of getting colon polyps. They include: °· Being older than 50 years. °· Family history of colon polyps or colon cancer. °· Long-term colon diseases, such as colitis or Crohn disease. °· Being overweight. °· Smoking. °· Being inactive. °· Drinking too much alcohol. °SYMPTOMS  °Most   small polyps do not cause symptoms. If symptoms are present, they may include: °· Blood in the stool. The stool may look dark red or black. °· Constipation or diarrhea that lasts longer than 1 week. °DIAGNOSIS °People often do not know they have polyps until their caregiver finds them during a regular checkup. Your caregiver can use 4 tests to check for polyps: °· Digital rectal exam. The caregiver wears gloves and feels inside the rectum. This test would find polyps only in the rectum. °· Barium enema.  The caregiver puts a liquid called barium into your rectum before taking X-rays of your colon. Barium makes your colon look white. Polyps are dark, so they are easy to see in the X-ray pictures. °· Sigmoidoscopy. A thin, flexible tube (sigmoidoscope) is placed into your rectum. The sigmoidoscope has a light and tiny camera in it. The caregiver uses the sigmoidoscope to look at the last third of your colon. °· Colonoscopy. This test is like sigmoidoscopy, but the caregiver looks at the entire colon. This is the most common method for finding and removing polyps. °TREATMENT  °Any polyps will be removed during a sigmoidoscopy or colonoscopy. The polyps are then tested for cancer. °PREVENTION  °To help lower your risk of getting more colon polyps: °· Eat plenty of fruits and vegetables. Avoid eating fatty foods. °· Do not smoke. °· Avoid drinking alcohol. °· Exercise every day. °· Lose weight if recommended by your caregiver. °· Eat plenty of calcium and folate. Foods that are rich in calcium include milk, cheese, and broccoli. Foods that are rich in folate include chickpeas, kidney beans, and spinach. °HOME CARE INSTRUCTIONS °Keep all follow-up appointments as directed by your caregiver. You may need periodic exams to check for polyps. °SEEK MEDICAL CARE IF: °You notice bleeding during a bowel movement. °Document Released: 12/01/2003 Document Revised: 05/29/2011 Document Reviewed: 05/16/2011 °ExitCare® Patient Information ©2015 [employer]. This information is not intended to replace advice given to you by your health care provider. Make sure you discuss any questions you have with your health care provider. ° ° ° ° °

## 2014-11-05 NOTE — Interval H&P Note (Signed)
History and Physical Interval Note:  11/05/2014 7:55 AM  Jenna Love  has presented today for surgery, with the diagnosis of screening colonoscopy  The various methods of treatment have been discussed with the patient and family. After consideration of risks, benefits and other options for treatment, the patient has consented to  Procedure(s) with comments: COLONOSCOPY WITH PROPOFOL (N/A) - 0845 - moved to 8:00 - pt notified as a surgical intervention .  The patient's history has been reviewed, patient examined, no change in status, stable for surgery.  I have reviewed the patient's chart and labs.  Questions were answered to the patient's satisfaction.     Ty Buntrock  No change.   Screening tcs; The risks, benefits, limitations, alternatives and imponderables have been reviewed with the patient. Questions have been answered. All parties are agreeable.

## 2014-11-05 NOTE — Anesthesia Postprocedure Evaluation (Signed)
  Anesthesia Post-op Note  Patient: Jenna Love  Procedure(s) Performed: Procedure(s) with comments: COLONOSCOPY WITH PROPOFOL (N/A) - in cecum at 0820, 18 minutes total withdrawel time POLYPECTOMY (N/A)  Patient Location: PACU  Anesthesia Type:MAC  Level of Consciousness: awake, alert , oriented and patient cooperative  Airway and Oxygen Therapy: Patient Spontanous Breathing  Post-op Pain: none  Post-op Assessment: Post-op Vital signs reviewed, Patient's Cardiovascular Status Stable, Respiratory Function Stable, Patent Airway, No signs of Nausea or vomiting and Pain level controlled              Post-op Vital Signs: Reviewed and stable  Last Vitals:  Filed Vitals:   11/05/14 0850  BP: 107/69  Pulse: 106  Temp: 36.9 C  Resp: 13    Complications: No apparent anesthesia complications

## 2014-11-06 ENCOUNTER — Encounter (HOSPITAL_COMMUNITY): Payer: Self-pay | Admitting: Internal Medicine

## 2014-11-08 ENCOUNTER — Encounter: Payer: Self-pay | Admitting: Internal Medicine

## 2014-11-20 ENCOUNTER — Encounter: Payer: Self-pay | Admitting: Family Medicine

## 2014-12-22 ENCOUNTER — Ambulatory Visit (INDEPENDENT_AMBULATORY_CARE_PROVIDER_SITE_OTHER): Payer: PRIVATE HEALTH INSURANCE | Admitting: Family Medicine

## 2014-12-22 ENCOUNTER — Encounter: Payer: Self-pay | Admitting: Family Medicine

## 2014-12-22 VITALS — BP 132/80 | HR 90 | Temp 98.2°F | Resp 16 | Ht 63.0 in | Wt 263.0 lb

## 2014-12-22 DIAGNOSIS — H6691 Otitis media, unspecified, right ear: Secondary | ICD-10-CM | POA: Diagnosis not present

## 2014-12-22 MED ORDER — AMOXICILLIN-POT CLAVULANATE 875-125 MG PO TABS: 1.0000 | ORAL_TABLET | Freq: Two times a day (BID) | ORAL | Status: DC

## 2014-12-22 MED ORDER — SERTRALINE HCL 100 MG PO TABS: ORAL_TABLET | ORAL | Status: DC

## 2014-12-22 NOTE — Progress Notes (Signed)
Patient ID: Jenna Love, female   DOB: 02-May-1960, 54 y.o.   MRN: [id number]   Subjective:    Patient ID: Jenna Love, female    DOB: 06-May-1960, 54 y.o.   MRN: [id number]  Patient presents for Ear Pain  patient here with right ear pain for the past orally 8 hours. She has not had any drainage from the ear. It is causing sharp pains to go up the side of her neck. It feels like her previous ear infections. She has not had any fever no sinus congestion or drainage no cough. Taken anything over-the-counter.    Review Of Systems:  GEN- denies fatigue, fever, weight loss,weakness, recent illness HEENT- denies eye drainage, change in vision, nasal discharge, CVS- denies chest pain, palpitations RESP- denies SOB, cough, wheeze Neuro- denies headache, dizziness, syncope, seizure activity       Objective:    BP 132/80 mmHg  Pulse 90  Temp(Src) 98.2 F (36.8 C)  Resp 16  Ht  (1.6 m)  Wt 263 lb (119.296 kg)  BMI 46.60 kg/m2 GEN- NAD, alert and oriented x3 HEENT- PERRL, EOMI, non injected sclera, pink conjunctiva, MMM, oropharynx mild injection,  Left TM clear no effusion, Right TM mild injection, +effusion, canal clear  No maxillary sinus tenderness, nares clear  Neck- Supple, no LAD CVS- RRR, no murmur RESP-CTAB  '       Assessment & Plan:      Problem List Items Addressed This Visit    None    Visit Diagnoses    Acute right otitis media, recurrence not specified, unspecified otitis media type    -  Primary    Early OM, pt has had ear infections in past, given AUgmentin,use Sudafed    Relevant Medications    amoxicillin-clavulanate (AUGMENTIN) 875-125 MG tablet       Note: This dictation was prepared with Dragon dictation along with smaller phrase technology. Any transcriptional errors that result from this process are unintentional.

## 2014-12-22 NOTE — Patient Instructions (Signed)
Take antibiotics as prescribed Use sudafed or decongestant F/U as previous

## 2015-02-03 ENCOUNTER — Other Ambulatory Visit: Payer: Self-pay | Admitting: Family Medicine

## 2015-02-03 ENCOUNTER — Ambulatory Visit (INDEPENDENT_AMBULATORY_CARE_PROVIDER_SITE_OTHER): Payer: PRIVATE HEALTH INSURANCE | Admitting: Family Medicine

## 2015-02-03 ENCOUNTER — Encounter: Payer: Self-pay | Admitting: Family Medicine

## 2015-02-03 VITALS — BP 128/64 | HR 72 | Temp 98.1°F | Resp 14 | Ht 63.0 in | Wt 261.0 lb

## 2015-02-03 DIAGNOSIS — G4700 Insomnia, unspecified: Secondary | ICD-10-CM

## 2015-02-03 DIAGNOSIS — Z23 Encounter for immunization: Secondary | ICD-10-CM

## 2015-02-03 DIAGNOSIS — F411 Generalized anxiety disorder: Secondary | ICD-10-CM

## 2015-02-03 DIAGNOSIS — Z1159 Encounter for screening for other viral diseases: Secondary | ICD-10-CM | POA: Diagnosis not present

## 2015-02-03 DIAGNOSIS — Z114 Encounter for screening for human immunodeficiency virus [HIV]: Secondary | ICD-10-CM | POA: Diagnosis not present

## 2015-02-03 DIAGNOSIS — F32A Depression, unspecified: Secondary | ICD-10-CM

## 2015-02-03 DIAGNOSIS — S41012A Laceration without foreign body of left shoulder, initial encounter: Secondary | ICD-10-CM | POA: Diagnosis not present

## 2015-02-03 DIAGNOSIS — S99921A Unspecified injury of right foot, initial encounter: Secondary | ICD-10-CM | POA: Diagnosis not present

## 2015-02-03 DIAGNOSIS — F329 Major depressive disorder, single episode, unspecified: Secondary | ICD-10-CM | POA: Diagnosis not present

## 2015-02-03 MED ORDER — CLONAZEPAM 1 MG PO TABS: 1.0000 mg | ORAL_TABLET | Freq: Three times a day (TID) | ORAL | Status: DC | PRN

## 2015-02-03 MED ORDER — SERTRALINE HCL 100 MG PO TABS: ORAL_TABLET | ORAL | Status: DC

## 2015-02-03 MED ORDER — ZOLPIDEM TARTRATE 10 MG PO TABS: 10.0000 mg | ORAL_TABLET | Freq: Every evening | ORAL | Status: DC | PRN

## 2015-02-03 NOTE — Assessment & Plan Note (Signed)
Continue zoloft, klonopin, ambien, doing well on meds Continue to focus on healthy eating and weight loss, which also contributes to her depression

## 2015-02-03 NOTE — Progress Notes (Signed)
Patient ID: Jenna Love, female   DOB: Dec 24, 1960, 54 y.o.   MRN: [id number]   Subjective:    Patient ID: Jenna Love, female    DOB: 01-29-1961, 54 y.o.   MRN: [id number]  Patient presents for Health Maintenece Update and Medication Review/ Refill  A she was here to review her medications. However on Sunday she  Woke up early the morning and stumbled down the last couple of steps and she fell into a drywall she also stumped her toe. She had a laceration to her left shoulder blade region she has some soreness there but is able to move the arm normally. She has some bruising of her right great toe. She states she does not have any pain when she walks. She's not used any ice or any anti-inflammatories.  She is doing well with her Zoloft she is taken 150 mg daily. She also uses her Klonopin. She is moved into a new home and they're dealing with substance abuse with her daughter but things are improving. She also reviewed her health maintenance we'll like to have her hepatitis C screening done    Review Of Systems:  GEN- denies fatigue, fever, weight loss,weakness, recent illness HEENT- denies eye drainage, change in vision, nasal discharge, CVS- denies chest pain, palpitations RESP- denies SOB, cough, wheeze ABD- denies N/V, change in stools, abd pain GU- denies dysuria, hematuria, dribbling, incontinence MSK-+joint pain, muscle aches, injury Neuro- denies headache, dizziness, syncope, seizure activity       Objective:    BP 128/64 mmHg  Pulse 72  Temp(Src) 98.1 F (36.7 C) (Oral)  Resp 14  Ht  (1.6 m)  Wt 261 lb (118.389 kg)  BMI 46.25 kg/m2 GEN- NAD, alert and oriented x3 HEENT- PERRL, EOMI, non injected sclera, pink conjunctiva, MMM, oropharynx clear CVS- RRR, no murmur RESP-CTAB MSK- bilat shoulder FROM, rotator cuff in tact, brusing with healed laceration 3inches right scapula, mild ecchymosis and swelling around, no hematoma palpated EXT- Fair ROM right  ankle, mild swelling lateral malleous neg squeeze test, Great toe, with bruising at DIP and base, fair ROM mild swelling NT, no gross bony deformity Pulses- Radial, DP- 2+        Assessment & Plan:      Problem List Items Addressed This Visit    Morbid obesity (HCC)   Insomnia   Depression - Primary   Relevant Medications   sertraline (ZOLOFT) 100 MG tablet   Anxiety state    Continue zoloft, klonopin, ambien, doing well on meds Continue to focus on healthy eating and weight loss, which also contributes to her depression      Relevant Medications   sertraline (ZOLOFT) 100 MG tablet    Other Visit Diagnoses    Need for hepatitis C screening test        Relevant Orders    Hepatitis C antibody, reflex    Screening for HIV (human immunodeficiency virus)        Relevant Orders    HIV antibody (with reflex)    Need for prophylactic vaccination against Streptococcus pneumoniae (pneumococcus)        H/O PNA X 2, and episodes of bronchitis, needs PNA vaccine    Relevant Orders    Pneumococcal polysaccharide vaccine 23-valent greater than or equal to 2yo subcutaneous/IM (Completed)    Toe injury, right, initial encounter        I suspect she may have fractured the toe, but is not causing pain, advised hard sole  shoe, ICE, elevate, declines XRAY at this time    Laceration of shoulder, left, initial encounter        Superficial laceration, TDAP UTD, mild swelling and bruising, joint ROM in tact       Note: This dictation was prepared with Dragon dictation along with smaller phrase technology. Any transcriptional errors that result from this process are unintentional.

## 2015-02-03 NOTE — Patient Instructions (Addendum)
Take the aleve twice a day for next week with food  Ice /elevate  Continue current medications F/U 4 months- Physical

## 2015-02-04 LAB — HEPATITIS C ANTIBODY: HCV AB: NEGATIVE

## 2015-02-04 LAB — HIV ANTIBODY (ROUTINE TESTING W REFLEX): HIV: NONREACTIVE

## 2015-02-21 ENCOUNTER — Encounter: Payer: Self-pay | Admitting: Family Medicine

## 2015-02-21 DIAGNOSIS — M25579 Pain in unspecified ankle and joints of unspecified foot: Secondary | ICD-10-CM

## 2015-02-21 DIAGNOSIS — M25569 Pain in unspecified knee: Secondary | ICD-10-CM

## 2015-06-02 ENCOUNTER — Ambulatory Visit (INDEPENDENT_AMBULATORY_CARE_PROVIDER_SITE_OTHER): Payer: PRIVATE HEALTH INSURANCE | Admitting: Family Medicine

## 2015-06-02 ENCOUNTER — Encounter: Payer: Self-pay | Admitting: Family Medicine

## 2015-06-02 VITALS — BP 126/74 | HR 100 | Temp 99.8°F | Resp 20 | Ht 63.0 in | Wt 252.0 lb

## 2015-06-02 DIAGNOSIS — J111 Influenza due to unidentified influenza virus with other respiratory manifestations: Secondary | ICD-10-CM | POA: Diagnosis not present

## 2015-06-02 LAB — INFLUENZA A AND B AG, IMMUNOASSAY
INFLUENZA B ANTIGEN: NOT DETECTED
Influenza A Antigen: NOT DETECTED

## 2015-06-02 MED ORDER — OSELTAMIVIR PHOSPHATE 75 MG PO CAPS: 75.0000 mg | ORAL_CAPSULE | Freq: Two times a day (BID) | ORAL | Status: DC

## 2015-06-02 MED ORDER — HYDROCOD POLST-CPM POLST ER 10-8 MG/5ML PO SUER: 5.0000 mL | Freq: Two times a day (BID) | ORAL | Status: DC | PRN

## 2015-06-02 NOTE — Progress Notes (Signed)
Patient ID: Jenna ConverseCathey L Love, female   DOB: 07/21/1960, 55 y.o.   MRN: [id number]    Subjective:    Patient ID: Jenna Love, female    DOB: 07/21/1960, 55 y.o.   MRN: [id number]  Patient presents for Illness Patient here with fever or body aches and congestion that started yesterday. Cough is mostly white production. She is not taking anything over-the-counter. She's also had chills. No GI symptoms. Positive sick contact with her grandchildren this weekend. She also feels some tightness in the chest worse at night when she is coughing   Review Of Systems:  GEN- denies fatigue, fever, weight loss,weakness, recent illness HEENT- denies eye drainage, change in vision, +nasal discharge, CVS- denies chest pain, palpitations RESP- denies SOB,+ cough, wheeze ABD- denies N/V, change in stools, abd pain GU- denies dysuria, hematuria, dribbling, incontinence MSK- denies joint pain, muscle aches, injury Neuro- denies headache, dizziness, syncope, seizure activity       Objective:    BP 126/74 mmHg  Pulse 100  Temp(Src) 99.8 F (37.7 C) (Oral)  Resp 20  Ht 5\' 3"  (1.6 m)  Wt 252 lb (114.306 kg)  BMI 44.65 kg/m2  SpO2 97% GEN- NAD, alert and oriented x3,ill appearing  HEENT- PERRL, EOMI, non injected sclera, pink conjunctiva, MMM, oropharynx clear, TM clear bilat, no effusion, nares clear rhinorrhea  Neck- Supple, no LAD CVS- RRR, no murmur RESP-CTAB EXT- No edema Pulses- Radial 2+        Assessment & Plan:      Problem List Items Addressed This Visit    None    Visit Diagnoses    Influenza    -  Primary    Suspect influenza and positive contacts, testing in office neg, will treat wtih Tamiflu based on clinical evidence and tussionex cough medicine given.     Relevant Medications    oseltamivir (TAMIFLU) 75 MG capsule    Other Relevant Orders    Influenza A and B Ag, Immunoassay (Completed)       Note: This dictation was prepared with Dragon dictation along with  smaller phrase technology. Any transcriptional errors that result from this process are unintentional.

## 2015-06-03 ENCOUNTER — Encounter: Payer: Self-pay | Admitting: Family Medicine

## 2015-06-07 ENCOUNTER — Encounter: Payer: Self-pay | Admitting: Family Medicine

## 2015-06-08 ENCOUNTER — Encounter: Payer: Self-pay | Admitting: Family Medicine

## 2015-06-08 ENCOUNTER — Ambulatory Visit (INDEPENDENT_AMBULATORY_CARE_PROVIDER_SITE_OTHER): Payer: PRIVATE HEALTH INSURANCE | Admitting: Family Medicine

## 2015-06-08 ENCOUNTER — Telehealth: Payer: Self-pay | Admitting: Family Medicine

## 2015-06-08 VITALS — BP 130/76 | HR 90 | Temp 99.7°F | Resp 16 | Ht 63.0 in | Wt 252.0 lb

## 2015-06-08 DIAGNOSIS — J209 Acute bronchitis, unspecified: Secondary | ICD-10-CM

## 2015-06-08 MED ORDER — ALBUTEROL SULFATE HFA 108 (90 BASE) MCG/ACT IN AERS: 2.0000 | INHALATION_SPRAY | Freq: Four times a day (QID) | RESPIRATORY_TRACT | Status: DC | PRN

## 2015-06-08 MED ORDER — HYDROCOD POLST-CPM POLST ER 10-8 MG/5ML PO SUER: 5.0000 mL | Freq: Two times a day (BID) | ORAL | Status: DC | PRN

## 2015-06-08 MED ORDER — PREDNISONE 20 MG PO TABS: 40.0000 mg | ORAL_TABLET | Freq: Every day | ORAL | Status: DC

## 2015-06-08 NOTE — Patient Instructions (Addendum)
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

## 2015-06-08 NOTE — Telephone Encounter (Signed)
Called back and asking for cough med and inhaler if possible  [phone] (M)

## 2015-06-08 NOTE — Telephone Encounter (Signed)
Treated for flu

## 2015-06-08 NOTE — Telephone Encounter (Signed)
Appointment scheduled.

## 2015-06-08 NOTE — Telephone Encounter (Signed)
Pt treated for Flu last week.  Today having bad shortness of breath and cough.  Says feels like asthma attack.  Wants us to work her in.  Does not want to go to urgent care.

## 2015-06-08 NOTE — Progress Notes (Signed)
Patient ID: Jenna ConverseCathey L Love, female   DOB: 05/23/1960, 55 y.o.   MRN: [id number]   Subjective:    Patient ID: Jenna Love, female    DOB: 05/23/1960, 55 y.o.   MRN: [id number]  Patient presents for Illness Patient here for cough with wheezing and mild shortness of breath. She was treated for the flu last week she has completed that medication but then started having severe coughing fits and some wheezing spells. She has remote history of asthma bronchitis. The cough medicine did help but she has run out of this. She has not had any further fever. She denies any sore throat minimal drainage from the nose.    Review Of Systems:  GEN- + fatigue, fever, weight loss,weakness, recent illness HEENT- denies eye drainage, change in vision, nasal discharge, CVS- denies chest pain, palpitations RESP- denies SOB, +cough, +wheeze ABD- denies N/V, change in stools, abd pain GU- denies dysuria, hematuria, dribbling, incontinence MSK- denies joint pain, muscle aches, injury Neuro- denies headache, dizziness, syncope, seizure activity       Objective:    BP 130/76 mmHg  Pulse 90  Temp(Src) 99.7 F (37.6 C) (Oral)  Resp 16  Ht 5\' 3"  (1.6 m)  Wt 252 lb (114.306 kg)  BMI 44.65 kg/m2  SpO2 97% GEN- NAD, alert and oriented x3 HEENT- PERRL, EOMI, non injected sclera, pink conjunctiva, MMM, oropharynx clear, nares clear rhinorrhea  Neck- Supple, shotty LAD  CVS- RRR, no murmur RESP-CTAB, normal WOB, no wheeze  EXT- No edema Pulses- Radial  2+        Assessment & Plan:      Problem List Items Addressed This Visit    None    Visit Diagnoses    Acute bronchitis, unspecified organism    -  Primary    Recent flu, now with bronchitis and wheezing, given albuterol, cough medicine, script for prednisone if she does not improve       Note: This dictation was prepared with Dragon dictation along with smaller phrase technology. Any transcriptional errors that result from this process are  unintentional.

## 2015-06-09 MED ORDER — ZOLPIDEM TARTRATE 10 MG PO TABS: 10.0000 mg | ORAL_TABLET | Freq: Every evening | ORAL | Status: DC | PRN

## 2015-06-09 MED ORDER — CLONAZEPAM 1 MG PO TABS: 1.0000 mg | ORAL_TABLET | Freq: Three times a day (TID) | ORAL | Status: DC | PRN

## 2015-07-07 ENCOUNTER — Other Ambulatory Visit: Payer: Self-pay | Admitting: Family Medicine

## 2015-07-07 DIAGNOSIS — Z1231 Encounter for screening mammogram for malignant neoplasm of breast: Secondary | ICD-10-CM

## 2015-07-14 ENCOUNTER — Ambulatory Visit (HOSPITAL_COMMUNITY)
Admission: RE | Admit: 2015-07-14 | Discharge: 2015-07-14 | Disposition: A | Discharge status: Home / Self Care | Payer: PRIVATE HEALTH INSURANCE | Source: Ambulatory Visit | Attending: Family Medicine | Admitting: Family Medicine

## 2015-07-14 DIAGNOSIS — Z1231 Encounter for screening mammogram for malignant neoplasm of breast: Secondary | ICD-10-CM | POA: Insufficient documentation

## 2015-07-28 ENCOUNTER — Ambulatory Visit: Payer: PRIVATE HEALTH INSURANCE | Admitting: Family Medicine

## 2015-08-18 ENCOUNTER — Encounter: Payer: Self-pay | Admitting: Family Medicine

## 2015-08-31 ENCOUNTER — Encounter: Payer: Self-pay | Admitting: Family Medicine

## 2015-09-06 ENCOUNTER — Ambulatory Visit (INDEPENDENT_AMBULATORY_CARE_PROVIDER_SITE_OTHER): Payer: PRIVATE HEALTH INSURANCE | Admitting: Family Medicine

## 2015-09-06 ENCOUNTER — Encounter: Payer: Self-pay | Admitting: Family Medicine

## 2015-09-06 VITALS — BP 120/72 | HR 82 | Temp 98.8°F | Resp 14 | Ht 63.0 in | Wt 255.0 lb

## 2015-09-06 DIAGNOSIS — F332 Major depressive disorder, recurrent severe without psychotic features: Secondary | ICD-10-CM | POA: Diagnosis not present

## 2015-09-06 DIAGNOSIS — F411 Generalized anxiety disorder: Secondary | ICD-10-CM

## 2015-09-06 MED ORDER — BUPROPION HCL ER (XL) 150 MG PO TB24: 150.0000 mg | ORAL_TABLET | Freq: Every day | ORAL | Status: DC

## 2015-09-06 NOTE — Progress Notes (Signed)
Patient ID: Jenna Love, female   DOB: [birth year], 55 y.o.   MRN: [id number]    Subjective:    Patient ID: Jenna Love, female    DOB: [birth year], [age] y.o.   MRN: [id number]  Patient presents for Medication Management Patient here to discuss medications. She felt like she has been more depressed the past few months. She's having difficulty with her grandson who is 85 months old he still in the hospital for leukemia she is working full-time her husband is not working and unfortunately he is back to drinking substantially. She is also taking some college courses. Things are very overwhelming at this time. She's not getting any support from her husband at home. No physical abuse but states that they do argue a lot. She's been on Zoloft for 10+ years. She did go seek therapy with her job and they recommended that she come in to discuss her medications. She is very leery of coming off of the Zoloft that she no B very difficult for her one to see if there are other options. In the past she's been on Paxil and Effexor Xanax, klonopin Ambien she remembers trying Wellbutrin at one point but does not remember any specifics.    Review Of Systems:  GEN- denies fatigue, fever, weight loss,weakness, recent illness HEENT- denies eye drainage, change in vision, nasal discharge, CVS- denies chest pain, palpitations RESP- denies SOB, cough, wheeze ABD- denies N/V, change in stools, abd pain GU- denies dysuria, hematuria, dribbling, incontinence MSK- denies joint pain, muscle aches, injury Neuro- denies headache, dizziness, syncope, seizure activity       Objective:    BP 120/72 mmHg  Pulse 82  Temp(Src) 98.8 F (37.1 C) (Oral)  Resp 14  Ht 5\' 3"  (1.6 m)  Wt 255 lb (115.667 kg)  BMI 45.18 kg/m2  LMP 05/25/2014 GEN- NAD, alert and oriented x3 HEENT- PERRL, EOMI, non injected sclera, pink conjunctiva, MMM, oropharynx clear CVS- RRR, no murmur RESP-CTAB Psych- tearful, depressed  appearing,not anxious, no SI, well groomed, good eye contact         Assessment & Plan:      Problem List Items Addressed This Visit    MDD (major depressive disorder) (HCC)    Long-term history on Zoloft. She would need to be tapered off over a period of time than started on a new medication. She is very wary of she is the only one working and how her mood will change. Instead worked in a try adding Wellbutrin to her regimen. She'll take Wellbutrin in the morning and continue the Zoloft in the evening she can continue the Klonopin as needed. She will also continue with her therapist. Unfortunately the home life is very difficult with her husband's drinking and is a major contributor to her depression and she does not have good support right now. I am going to provide her with some FMLA for her anxiety and depression there been days where she is not very difficult to work. She is also helping with her grandchild per above that has leukemia and she is having a difficult time coping with his diagnosis of such a young age.      Relevant Medications   buPROPion (WELLBUTRIN XL) 150 MG 24 hr tablet   GAD (generalized anxiety disorder) - Primary    Continue klonopin         Note: This dictation was prepared with Dragon dictation along with smaller phrase technology. Any transcriptional errors that result from this  process are unintentional.

## 2015-09-06 NOTE — Assessment & Plan Note (Signed)
Continue klonopin 

## 2015-09-06 NOTE — Patient Instructions (Signed)
Wellbutrin once a day in the morning Continue the zoloft Okay to use klonopin  Schedule wellness exam July  Contact about FMLA

## 2015-09-06 NOTE — Assessment & Plan Note (Signed)
Long-term history on Zoloft. She would need to be tapered off over a period of time than started on a new medication. She is very wary of she is the only one working and how her mood will change. Instead worked in a try adding Wellbutrin to her regimen. She'll take Wellbutrin in the morning and continue the Zoloft in the evening she can continue the Klonopin as needed. She will also continue with her therapist. Unfortunately the home life is very difficult with her husband's drinking and is a major contributor to her depression and she does not have good support right now. I am going to provide her with some FMLA for her anxiety and depression there been days where she is not very difficult to work. She is also helping with her grandchild per above that has leukemia and she is having a difficult time coping with his diagnosis of such a young age.

## 2015-09-09 ENCOUNTER — Telehealth: Payer: Self-pay | Admitting: *Deleted

## 2015-09-09 NOTE — Telephone Encounter (Signed)
Received fax from HoughtonMichelle Smith, HR with [employer] [place] [facility].  [phone]~ telephone/ [phone]~ fax.  Call placed to patient for more information.   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   Forms routed to provider.

## 2015-09-14 NOTE — Telephone Encounter (Signed)
Received completed FMLA forms from provider.   No charge per provider. Patient contacted and made aware.   [place] to [employer] [place].

## 2015-09-29 ENCOUNTER — Encounter: Payer: Self-pay | Admitting: Family Medicine

## 2015-09-29 MED ORDER — ZOLPIDEM TARTRATE 10 MG PO TABS: 10.0000 mg | ORAL_TABLET | Freq: Every evening | ORAL | Status: DC | PRN

## 2015-09-29 MED ORDER — CLONAZEPAM 1 MG PO TABS: 1.0000 mg | ORAL_TABLET | Freq: Three times a day (TID) | ORAL | Status: DC | PRN

## 2015-10-12 ENCOUNTER — Encounter: Payer: Self-pay | Admitting: Family Medicine

## 2015-10-12 ENCOUNTER — Ambulatory Visit (INDEPENDENT_AMBULATORY_CARE_PROVIDER_SITE_OTHER): Payer: PRIVATE HEALTH INSURANCE | Admitting: Family Medicine

## 2015-10-12 VITALS — BP 122/70 | HR 88 | Temp 98.7°F | Resp 16 | Ht 63.0 in | Wt 256.0 lb

## 2015-10-12 DIAGNOSIS — F332 Major depressive disorder, recurrent severe without psychotic features: Secondary | ICD-10-CM | POA: Diagnosis not present

## 2015-10-12 DIAGNOSIS — F411 Generalized anxiety disorder: Secondary | ICD-10-CM

## 2015-10-12 DIAGNOSIS — Z0000 Encounter for general adult medical examination without abnormal findings: Secondary | ICD-10-CM | POA: Diagnosis not present

## 2015-10-12 NOTE — Assessment & Plan Note (Signed)
Continue Wellbutrin at the current dose will see how she does at the lower dose of Zoloft I think she is likely going need her 100 mg.  Continue klonopin Advised her to get back to her weight watchers and her exercise she was much happier with herself when she was losing weight and more active. She should also reschedule with her therapist.

## 2015-10-12 NOTE — Assessment & Plan Note (Signed)
She admits to dietary indiscretions eating a lot of junk food and chocolate

## 2015-10-12 NOTE — Patient Instructions (Addendum)
[email or web address] Continue current medications Stick with 50mg  for a few weeks and see how you do  Get back to exercise and weight watchers  F/U Beginning of Sept

## 2015-10-12 NOTE — Progress Notes (Signed)
    Subjective:    Patient ID: Jenna Love, female    DOB: [birth year], 55 y.o.   MRN: [id number]  Patient presents for CPE (has had labs) Patient for complete physical exam. Mammogram up-to-date normal Pap smear up-to-date in 2016 colonoscopy up-to-date from August 20 16. She's had hepatitis C screening Medications up-to-date Labs performed at her job I do not see where these are scanned in her chart  In having significant anxiety and difficulty with depression at home as her grandchild has been sick also her husband has been drinking more. She denies any physical abuse. She is taking her Zoloft Wellbutrin and uses Klonopin as needed for anxiety. She was alsogetting therapy. Since her last visit she has to cut back on her Zoloft to 50 mg and continue the Wellbutrin and the Klonopin down down to once or twice a day at the most. She's not had another visit with the therapist completes her reschedule. Her grandson was able to come home he has a trach as well as a feeding tube he is about 15 months old now.  She has had some swelling in her ankles at the end of her day. No shortness of breath no chest pain Review Of Systems:  GEN- denies fatigue, fever, weight loss,weakness, recent illness HEENT- denies eye drainage, change in vision, nasal discharge, CVS- denies chest pain, palpitations RESP- denies SOB, cough, wheeze ABD- denies N/V, change in stools, abd pain GU- denies dysuria, hematuria, dribbling, incontinence MSK- denies joint pain, muscle aches, injury Neuro- denies headache, dizziness, syncope, seizure activity       Objective:    BP 122/70 (BP Location: Left Arm, Patient Position: Sitting, Cuff Size: Large)   Pulse 88   Temp 98.7 F (37.1 C) (Oral)   Resp 16   Ht 5\' 3"  (1.6 m)   Wt 256 lb (116.1 kg)   LMP 05/25/2014   BMI 45.35 kg/m  GEN- NAD, alert and oriented x3 HEENT- PERRL, EOMI, non injected sclera, pink conjunctiva, MMM, oropharynx clear Neck- Supple, no  thyromegaly CVS- RRR, no murmur RESP-CTAB ABD-NABS,soft,NT,ND Psych- normal affect and mood, smiling today   EXT- No edema Pulses- Radial, DP- 2+        Assessment & Plan:      Problem List Items Addressed This Visit    Morbid obesity (HCC)    She admits to dietary indiscretions eating a lot of junk food and chocolate      MDD (major depressive disorder) (HCC)    Continue Wellbutrin at the current dose will see how she does at the lower dose of Zoloft I think she is likely going need her 100 mg.  Continue klonopin Advised her to get back to her weight watchers and her exercise she was much happier with herself when she was losing weight and more active. She should also reschedule with her therapist.       GAD (generalized anxiety disorder) - Primary    Other Visit Diagnoses    Routine general medical examination at a health care facility       CPE done, prevention UTD, will see if we can find her fasting labs, if not she will email me a copy      Note: This dictation was prepared with Dragon dictation along with smaller phrase technology. Any transcriptional errors that result from this process are unintentional.

## 2015-10-25 ENCOUNTER — Encounter: Payer: Self-pay | Admitting: Family Medicine

## 2015-10-25 ENCOUNTER — Ambulatory Visit (INDEPENDENT_AMBULATORY_CARE_PROVIDER_SITE_OTHER): Payer: PRIVATE HEALTH INSURANCE | Admitting: Family Medicine

## 2015-10-25 VITALS — BP 128/72 | HR 82 | Temp 98.7°F | Resp 16 | Ht 63.0 in | Wt 256.0 lb

## 2015-10-25 DIAGNOSIS — F411 Generalized anxiety disorder: Secondary | ICD-10-CM

## 2015-10-25 DIAGNOSIS — F331 Major depressive disorder, recurrent, moderate: Secondary | ICD-10-CM

## 2015-10-25 MED ORDER — BUPROPION HCL ER (XL) 300 MG PO TB24: 300.0000 mg | ORAL_TABLET | Freq: Every day | ORAL | 6 refills | Status: DC

## 2015-10-25 NOTE — Assessment & Plan Note (Signed)
Will increase Wellbutrin to 300 mg extended release I will taper her off of the Zoloft she will decrease to 25 mg for the next 3 days and then stop since her Wellbutrin is going up. I think if I can get her on one medication she would not be as paranoid. She can also continue to use the Klonopin as needed for panic attacks. We discussed her seeing a therapist as well with all of her issues at home she already has therapy lined up. I actually recommended that she take at least 2-3 weeks off of work but she only wants to take 1 week off we will see how she is doing next week if she is ready to return.

## 2015-10-25 NOTE — Progress Notes (Signed)
   Subjective:    Patient ID: Jenna Love, female    DOB: 1960/05/22, 55 y.o.   MRN: [id number]  Patient presents for Medication Management (Zoloft- having crying spells, panic attacks x3 days) Patient here for medication management. She's been feeling depressed and anxious for the past week but worsened this week and she's been tearful and crying the entire weekend. She started thinking about her daughter not being able to be close to her to help take care of her grandson he has a lot of special needs since being discharged. She also feels like her husband's drinking because he is not happy with her. She is upset with her weight seems everything came to ahead. She had decreased her Zoloft on her own down to 50 mg that she is fearful of taking both that and the Wellbutrin and the pharmacist asked her if she was on both and that made her even more fearful of the medication. She would like to adjust her medications to help with her depression symptoms. She did have a panic attack this weekend but that did resolve with the use of some Klonopin. She feels like work is very overwhelming right now the swelling would like some time off of work. She is only used one of her FMLA days  She feels the wellbutrin works better than the Zoloft   Review Of Systems:  GEN- denies fatigue, fever, weight loss,weakness, recent illness HEENT- denies eye drainage, change in vision, nasal discharge, CVS- denies chest pain, palpitations RESP- denies SOB, cough, wheeze ABD- denies N/V, change in stools, abd pain GU- denies dysuria, hematuria, dribbling, incontinence MSK- denies joint pain, muscle aches, injury Neuro- denies headache, dizziness, syncope, seizure activity       Objective:    BP 128/72 (BP Location: Left Arm, Patient Position: Sitting, Cuff Size: Large)   Pulse 82   Temp 98.7 F (37.1 C) (Oral)   Resp 16   Ht 5\' 3"  (1.6 m)   Wt 256 lb (116.1 kg)   LMP 05/25/2014   BMI 45.35 kg/m  GEN- NAD,  alert and oriented x3 Psych- depressed, tearful, good eye contact, no SI, normal speech, normal thought process         Assessment & Plan:      Problem List Items Addressed This Visit    MDD (major depressive disorder) (HCC)    Will increase Wellbutrin to 300 mg extended release I will taper her off of the Zoloft she will decrease to 25 mg for the next 3 days and then stop since her Wellbutrin is going up. I think if I can get her on one medication she would not be as paranoid. She can also continue to use the Klonopin as needed for panic attacks. We discussed her seeing a therapist as well with all of her issues at home she already has therapy lined up. I actually recommended that she take at least 2-3 weeks off of work but she only wants to take 1 week off we will see how she is doing next week if she is ready to return.      Relevant Medications   buPROPion (WELLBUTRIN XL) 300 MG 24 hr tablet   GAD (generalized anxiety disorder) - Primary    Other Visit Diagnoses   None.     Note: This dictation was prepared with Dragon dictation along with smaller phrase technology. Any transcriptional errors that result from this process are unintentional.

## 2015-10-25 NOTE — Patient Instructions (Signed)
Take 2 of the wellbutrin  tablets - total , this is new dose, and new script will be sent  Take zoloft   or another 3 days, then stop Use klonopin as needed  F/U 6 weeks

## 2015-11-04 ENCOUNTER — Encounter: Payer: Self-pay | Admitting: Family Medicine

## 2015-11-05 ENCOUNTER — Ambulatory Visit (INDEPENDENT_AMBULATORY_CARE_PROVIDER_SITE_OTHER): Payer: PRIVATE HEALTH INSURANCE | Admitting: Family Medicine

## 2015-11-05 ENCOUNTER — Encounter: Payer: Self-pay | Admitting: Family Medicine

## 2015-11-05 VITALS — BP 128/72 | HR 80 | Temp 98.2°F | Resp 16 | Ht 63.0 in | Wt 257.0 lb

## 2015-11-05 DIAGNOSIS — F331 Major depressive disorder, recurrent, moderate: Secondary | ICD-10-CM | POA: Diagnosis not present

## 2015-11-05 DIAGNOSIS — F411 Generalized anxiety disorder: Secondary | ICD-10-CM

## 2015-11-05 MED ORDER — SERTRALINE HCL 50 MG PO TABS: 50.0000 mg | ORAL_TABLET | Freq: Every day | ORAL | 3 refills | Status: DC

## 2015-11-05 NOTE — Patient Instructions (Addendum)
Take the wellbutrin in the morning zoloft in evening - take 25mg  once a day  I will call and check on you Schedule with your therapist  F/U 4 weeks

## 2015-11-08 ENCOUNTER — Encounter: Payer: Self-pay | Admitting: Family Medicine

## 2015-11-08 NOTE — Assessment & Plan Note (Signed)
History treated with multiple medications in the past. We'll just the Wellbutrin is possible that just her anxiety symptoms were not covered enough with Klonopin where there may be some underlying hypomania/bipolar. She still declines going to a psychiatrist for med management. Advised her that we will try the addition of Zoloft again at 25 mg to begin with at bedtime in addition to the Wellbutrin. If her symptoms are not controlled and we are see no benefit with her psychotherapist she really needs to see a psychiatrist for further evaluation and med management. She states that she has gone a call her therapist next week.

## 2015-11-08 NOTE — Progress Notes (Signed)
   Subjective:    Patient ID: Jenna Love, female    DOB: [birth year], 55 y.o.   MRN: [id number]  Patient presents for Medication Management (states that she has had increased insomnia since beginning new doses of meds)  She is here to discuss medications. She was seen on August 7 at that time she wanted to come off the Zoloft and be on only one medication. She does have underlying anxiety as well as depression chronic insomnia. I increased her Wellbutrin to 300 mg she states that she has done well with that during the daytime she has energy she does not feel depressed or crying but then she feels on edge and nighttime she cannot sleep she feels very anxious. She misses the calming effect of the Zoloft especially in the evening. Of note she has not scheduled with her psychotherapist like we discussed at the last visit. She declines going to a psychiatrist for med management. She cannot sleep even with Ambien and the Klonopin since she stopped the Zoloft.   Review Of Systems:  GEN- denies fatigue, fever, weight loss,weakness, recent illness HEENT- denies eye drainage, change in vision, nasal discharge, CVS- denies chest pain, palpitations RESP- denies SOB, cough, wheeze Neuro- denies headache, dizziness, syncope, seizure activity       Objective:    BP 128/72 (BP Location: Left Arm, Patient Position: Sitting, Cuff Size: Large)   Pulse 80   Temp 98.2 F (36.8 C) (Oral)   Resp 16   Ht 5\' 3"  (1.6 m)   Wt 257 lb (116.6 kg)   LMP 05/25/2014   BMI 45.53 kg/m  GEN- NAD, alert and oriented x3 Psych- anxious appearing, a little shaky and tearful during exam, no SI, no hallucinations, good eye contact,         Assessment & Plan:      Problem List Items Addressed This Visit    MDD (major depressive disorder) (HCC)    History treated with multiple medications in the past. We'll just the Wellbutrin is possible that just her anxiety symptoms were not covered enough with Klonopin where  there may be some underlying hypomania/bipolar. She still declines going to a psychiatrist for med management. Advised her that we will try the addition of Zoloft again at 25 mg to begin with at bedtime in addition to the Wellbutrin. If her symptoms are not controlled and we are see no benefit with her psychotherapist she really needs to see a psychiatrist for further evaluation and med management. She states that she has gone a call her therapist next week.      Relevant Medications   sertraline (ZOLOFT) 50 MG tablet   GAD (generalized anxiety disorder) - Primary    Other Visit Diagnoses   None.     Note: This dictation was prepared with Dragon dictation along with smaller phrase technology. Any transcriptional errors that result from this process are unintentional.

## 2015-12-07 ENCOUNTER — Telehealth: Payer: Self-pay | Admitting: Family Medicine

## 2015-12-07 NOTE — Telephone Encounter (Signed)
I spoke with patient. Her husband was in recently and was concerned she was having a lot of mood swings she denies this states that her only stresses his drinking though he denies the alcohol. She [facility] they stated they needed paperwork from us on not sure what this is in reference to. She is still in agreement with going to therapy and see another psychiatrist. We will call [facility] in the morning get any paperwork and as soon as possible so she can start treatment. For now I'll increase her Zoloft to 50 mg she will continue the Wellbutrin and the Klonopin as needed  Please call [facility], see what is needed, why pt cant be scheduled, do they need records or referral ?

## 2015-12-09 ENCOUNTER — Encounter: Payer: Self-pay | Admitting: Family Medicine

## 2015-12-09 NOTE — Telephone Encounter (Signed)
Call [facility] to [facility] [phone]~ telephone.   Reports that recent chart notes are required for medication management.   Faxed notes to [phone].  Call placed to patient and patient made aware.

## 2015-12-14 ENCOUNTER — Ambulatory Visit (INDEPENDENT_AMBULATORY_CARE_PROVIDER_SITE_OTHER): Payer: PRIVATE HEALTH INSURANCE | Admitting: Family Medicine

## 2015-12-14 ENCOUNTER — Encounter: Payer: Self-pay | Admitting: Family Medicine

## 2015-12-14 VITALS — BP 130/78 | HR 78 | Temp 98.5°F | Resp 14 | Ht 63.0 in | Wt 258.0 lb

## 2015-12-14 DIAGNOSIS — J069 Acute upper respiratory infection, unspecified: Secondary | ICD-10-CM | POA: Diagnosis not present

## 2015-12-14 MED ORDER — HYDROCOD POLST-CPM POLST ER 10-8 MG/5ML PO SUER: 5.0000 mL | Freq: Two times a day (BID) | ORAL | 0 refills | Status: DC | PRN

## 2015-12-14 MED ORDER — CHLORPHEN-PE-ACETAMINOPHEN 4-10-325 MG PO TABS: ORAL_TABLET | ORAL | 0 refills | Status: DC

## 2015-12-14 MED ORDER — AZITHROMYCIN 250 MG PO TABS: ORAL_TABLET | ORAL | 0 refills | Status: DC

## 2015-12-14 NOTE — Progress Notes (Signed)
   Subjective:    Patient ID: Jenna Love, female    DOB: 1960/05/14, 55 y.o.   MRN: [id number]  Patient presents for Illness (x3 days- nasal drainage, nonprodoctive cough, sore throat)    Patient here with cough with mild production nasal drainage postnasal drip sore throat for the past 3 days. She has not had any fever. No wheezing or shortness of breath. She does have history pneumonia and bronchitis. She's been taking over-the-counter NyQuil. No known sick contacts.  Note she also has not heard anything back from her psychiatry referral, paperwork [facility]. She was given an alternative today [facility]  Review Of Systems:  GEN- denies fatigue, fever, weight loss,weakness, recent illness HEENT- denies eye drainage, change in vision, +nasal discharge, CVS- denies chest pain, palpitations RESP- denies SOB, +cough, wheeze ABD- denies N/V, change in stools, abd pain GU- denies dysuria, hematuria, dribbling, incontinence MSK- denies joint pain, muscle aches, injury Neuro- denies headache, dizziness, syncope, seizure activity       Objective:    BP 130/78 (BP Location: Left Arm, Patient Position: Sitting, Cuff Size: Large)   Pulse 78   Temp 98.5 F (36.9 C) (Oral)   Resp 14   Ht 5\' 3"  (1.6 m)   Wt 258 lb (117 kg)   LMP 05/25/2014   BMI 45.70 kg/m  GEN- NAD, alert and oriented x3 HEENT- PERRL, EOMI, non injected sclera, pink conjunctiva, MMM, oropharynx mild injection, TM clear bilat no effusion, no maxillary sinus tenderness, inflammed turbinates,  Nasal drainage  Neck- Supple, + shotty ant  LAD CVS- RRR, no murmur RESP-CTAB EXT- No edema Pulses- Radial 2+          Assessment & Plan:      Problem List Items Addressed This Visit    None    Visit Diagnoses    Acute URI    -  Primary   Discussed viral illness, given antibiotic not to fill until Friday if not better, given Norel AD to decongest and tussionex  cough med.   Relevant Medications   azithromycin (ZITHROMAX) 250 MG tablet      Note: This dictation was prepared with Dragon dictation along with smaller phrase technology. Any transcriptional errors that result from this process are unintentional.

## 2015-12-14 NOTE — Patient Instructions (Addendum)
[facility] [phone] Do not take antibiotic unless you are not improved by Friday  Take cough medicine  F/U as previous

## 2016-01-11 ENCOUNTER — Other Ambulatory Visit: Payer: Self-pay | Admitting: Family Medicine

## 2016-01-11 NOTE — Telephone Encounter (Signed)
Ok to refill??  Last office visit 12/14/2015.  Last refill 09/29/2015, #3 refills.

## 2016-01-11 NOTE — Telephone Encounter (Signed)
Medication called to pharmacy. 

## 2016-01-11 NOTE — Telephone Encounter (Signed)
okay

## 2016-01-25 ENCOUNTER — Other Ambulatory Visit: Payer: Self-pay | Admitting: Family Medicine

## 2016-01-25 ENCOUNTER — Encounter: Payer: Self-pay | Admitting: Family Medicine

## 2016-01-25 NOTE — Telephone Encounter (Signed)
LRF 09/29/15 #3o + 3   LOV 12/14/15  OK refill?

## 2016-01-25 NOTE — Telephone Encounter (Signed)
Okay to refill? 

## 2016-01-26 MED ORDER — ZOLPIDEM TARTRATE 10 MG PO TABS: 10.0000 mg | ORAL_TABLET | Freq: Every evening | ORAL | 3 refills | Status: DC | PRN

## 2016-02-07 ENCOUNTER — Encounter: Payer: Self-pay | Admitting: Family Medicine

## 2016-02-07 MED ORDER — CLONAZEPAM 1 MG PO TABS: ORAL_TABLET | ORAL | 0 refills | Status: DC

## 2016-03-06 ENCOUNTER — Ambulatory Visit: Payer: PRIVATE HEALTH INSURANCE | Admitting: Family Medicine

## 2016-03-22 ENCOUNTER — Telehealth: Payer: PRIVATE HEALTH INSURANCE | Admitting: Family

## 2016-03-22 DIAGNOSIS — R6889 Other general symptoms and signs: Secondary | ICD-10-CM

## 2016-03-22 MED ORDER — OSELTAMIVIR PHOSPHATE 75 MG PO CAPS: 75.0000 mg | ORAL_CAPSULE | Freq: Two times a day (BID) | ORAL | 0 refills | Status: DC

## 2016-03-22 NOTE — Progress Notes (Signed)

## 2016-03-24 ENCOUNTER — Emergency Department (HOSPITAL_COMMUNITY): Payer: PRIVATE HEALTH INSURANCE

## 2016-03-24 ENCOUNTER — Emergency Department (HOSPITAL_COMMUNITY)
Admission: EM | Admit: 2016-03-24 | Discharge: 2016-03-24 | Disposition: A | Discharge status: Home / Self Care | Payer: PRIVATE HEALTH INSURANCE | Attending: Emergency Medicine | Admitting: Emergency Medicine

## 2016-03-24 ENCOUNTER — Encounter: Payer: Self-pay | Admitting: Family Medicine

## 2016-03-24 ENCOUNTER — Encounter (HOSPITAL_COMMUNITY): Payer: Self-pay | Admitting: Emergency Medicine

## 2016-03-24 DIAGNOSIS — R109 Unspecified abdominal pain: Secondary | ICD-10-CM | POA: Insufficient documentation

## 2016-03-24 DIAGNOSIS — R3915 Urgency of urination: Secondary | ICD-10-CM | POA: Diagnosis not present

## 2016-03-24 DIAGNOSIS — J181 Lobar pneumonia, unspecified organism: Secondary | ICD-10-CM | POA: Diagnosis not present

## 2016-03-24 DIAGNOSIS — Z79899 Other long term (current) drug therapy: Secondary | ICD-10-CM | POA: Insufficient documentation

## 2016-03-24 DIAGNOSIS — R112 Nausea with vomiting, unspecified: Secondary | ICD-10-CM | POA: Insufficient documentation

## 2016-03-24 DIAGNOSIS — J189 Pneumonia, unspecified organism: Secondary | ICD-10-CM

## 2016-03-24 DIAGNOSIS — R350 Frequency of micturition: Secondary | ICD-10-CM | POA: Diagnosis not present

## 2016-03-24 DIAGNOSIS — M2550 Pain in unspecified joint: Secondary | ICD-10-CM | POA: Diagnosis not present

## 2016-03-24 DIAGNOSIS — R05 Cough: Secondary | ICD-10-CM | POA: Diagnosis present

## 2016-03-24 DIAGNOSIS — M791 Myalgia: Secondary | ICD-10-CM | POA: Insufficient documentation

## 2016-03-24 LAB — URINALYSIS, ROUTINE W REFLEX MICROSCOPIC
Bilirubin Urine: NEGATIVE
GLUCOSE, UA: NEGATIVE mg/dL
Hgb urine dipstick: NEGATIVE
Ketones, ur: NEGATIVE mg/dL
Leukocytes, UA: NEGATIVE
Nitrite: NEGATIVE
PH: 6 (ref 5.0–8.0)
Protein, ur: 30 mg/dL — AB
SPECIFIC GRAVITY, URINE: 1.012 (ref 1.005–1.030)

## 2016-03-24 LAB — CBC WITH DIFFERENTIAL/PLATELET
Basophils Absolute: 0 10*3/uL (ref 0.0–0.1)
Basophils Relative: 0 %
EOS ABS: 0 10*3/uL (ref 0.0–0.7)
EOS PCT: 0 %
HCT: 36.9 % (ref 36.0–46.0)
Hemoglobin: 12.3 g/dL (ref 12.0–15.0)
LYMPHS PCT: 8 %
Lymphs Abs: 0.9 10*3/uL (ref 0.7–4.0)
MCH: 30.1 pg (ref 26.0–34.0)
MCHC: 33.3 g/dL (ref 30.0–36.0)
MCV: 90.2 fL (ref 78.0–100.0)
MONO ABS: 0.9 10*3/uL (ref 0.1–1.0)
Monocytes Relative: 8 %
Neutro Abs: 9.5 10*3/uL — ABNORMAL HIGH (ref 1.7–7.7)
Neutrophils Relative %: 84 %
PLATELETS: 184 10*3/uL (ref 150–400)
RBC: 4.09 MIL/uL (ref 3.87–5.11)
RDW: 13.3 % (ref 11.5–15.5)
WBC: 11.3 10*3/uL — AB (ref 4.0–10.5)

## 2016-03-24 LAB — BASIC METABOLIC PANEL
Anion gap: 8 (ref 5–15)
BUN: 11 mg/dL (ref 6–20)
CALCIUM: 8.6 mg/dL — AB (ref 8.9–10.3)
CO2: 23 mmol/L (ref 22–32)
Chloride: 100 mmol/L — ABNORMAL LOW (ref 101–111)
Creatinine, Ser: 0.81 mg/dL (ref 0.44–1.00)
GFR calc Af Amer: >60 mL/min (ref >60)
GLUCOSE: 166 mg/dL — AB (ref 65–99)
POTASSIUM: 3.6 mmol/L (ref 3.5–5.1)
SODIUM: 131 mmol/L — AB (ref 135–145)

## 2016-03-24 LAB — INFLUENZA PANEL BY PCR (TYPE A & B)
Influenza A By PCR: NEGATIVE
Influenza B By PCR: NEGATIVE

## 2016-03-24 MED ORDER — DEXTROSE 5 % IV SOLN
500.0000 mg | Freq: Once | INTRAVENOUS | Status: AC
  Administered 2016-03-24: 500 mg via INTRAVENOUS
  Filled 2016-03-24: qty 500

## 2016-03-24 MED ORDER — SODIUM CHLORIDE 0.9 % IV BOLUS (SEPSIS)
1000.0000 mL | Freq: Once | INTRAVENOUS | Status: AC
  Administered 2016-03-24: 1000 mL via INTRAVENOUS

## 2016-03-24 MED ORDER — BENZONATATE 100 MG PO CAPS: 100.0000 mg | ORAL_CAPSULE | Freq: Three times a day (TID) | ORAL | 0 refills | Status: DC | PRN

## 2016-03-24 MED ORDER — FENTANYL CITRATE (PF) 100 MCG/2ML IJ SOLN
50.0000 ug | Freq: Once | INTRAMUSCULAR | Status: AC
  Administered 2016-03-24: 50 ug via INTRAVENOUS
  Filled 2016-03-24: qty 2

## 2016-03-24 MED ORDER — ONDANSETRON HCL 4 MG PO TABS: 4.0000 mg | ORAL_TABLET | Freq: Three times a day (TID) | ORAL | 0 refills | Status: DC | PRN

## 2016-03-24 MED ORDER — AZITHROMYCIN 250 MG PO TABS: 250.0000 mg | ORAL_TABLET | Freq: Every day | ORAL | 0 refills | Status: DC

## 2016-03-24 MED ORDER — DEXTROSE 5 % IV SOLN
1.0000 g | Freq: Once | INTRAVENOUS | Status: AC
  Administered 2016-03-24: 1 g via INTRAVENOUS
  Filled 2016-03-24: qty 10

## 2016-03-24 MED ORDER — ACETAMINOPHEN 325 MG PO TABS
650.0000 mg | ORAL_TABLET | Freq: Once | ORAL | Status: AC
  Administered 2016-03-24: 650 mg via ORAL
  Filled 2016-03-24: qty 2

## 2016-03-24 MED ORDER — ONDANSETRON HCL 4 MG/2ML IJ SOLN
4.0000 mg | Freq: Once | INTRAMUSCULAR | Status: AC
  Administered 2016-03-24: 4 mg via INTRAVENOUS
  Filled 2016-03-24: qty 2

## 2016-03-24 MED ORDER — AMOXICILLIN 500 MG PO CAPS: 1000.0000 mg | ORAL_CAPSULE | Freq: Two times a day (BID) | ORAL | 0 refills | Status: DC

## 2016-03-24 MED ORDER — BENZONATATE 100 MG PO CAPS
100.0000 mg | ORAL_CAPSULE | Freq: Once | ORAL | Status: AC
  Administered 2016-03-24: 100 mg via ORAL
  Filled 2016-03-24: qty 1

## 2016-03-24 MED ORDER — KETOROLAC TROMETHAMINE 30 MG/ML IJ SOLN
15.0000 mg | Freq: Once | INTRAMUSCULAR | Status: AC
  Administered 2016-03-24: 15 mg via INTRAVENOUS
  Filled 2016-03-24: qty 1

## 2016-03-24 NOTE — ED Notes (Signed)
Pt tolerating PO fluids at this time.

## 2016-03-24 NOTE — ED Provider Notes (Signed)
[facility] Provider Note   CSN: [id number] Arrival date & time: 03/24/16  4098     History   Chief Complaint Chief Complaint  Patient presents with  . Nausea  . Emesis  . Weakness    HPI Jenna Love is a 56 y.o. female.  56 yo F w/o PMH here with a couple days of progressively worsening cough, body aches, flank pain, decreased appetite. This morning with vomiting. Did an e visit Wednesday and started tamiflu then. Abdominal pain after vomiting. Some decrease in urine that also has an odor. Some flank pain. No other associated or modifying symptoms.       Past Medical History:  Diagnosis Date  . Anxiety   . Panic attacks   . Pneumonia   . Thyroid nodule 2013   Benign biopsy- ENT Dr. Pollyann Kennedy    Patient Active Problem List   Diagnosis Date Noted  . History of colonic polyps   . Encounter for screening colonoscopy 10/13/2014  . High risk medication use 10/13/2014  . Peripheral edema 05/06/2014  . SOB (shortness of breath) 05/06/2014  . Post-menopausal bleeding 08/20/2013  . Chronic cough 05/20/2013  . Thyroid nodule 04/15/2013  . Hypertriglyceridemia 02/21/2013  . Perimenopausal 09/18/2011  . MDD (major depressive disorder) (HCC) 06/29/2011  . Menorrhagia 06/15/2011  . Insomnia 04/14/2011  . Morbid obesity (HCC) 04/14/2011  . GAD (generalized anxiety disorder) 10/23/2009    Past Surgical History:  Procedure Laterality Date  . CESAREAN SECTION  1992  . COLONOSCOPY WITH PROPOFOL N/A 11/05/2014   Procedure: COLONOSCOPY WITH PROPOFOL;  Surgeon: Corbin Ade, MD;  Location: [facility];  Service: Endoscopy;  Laterality: N/A;  in cecum at 0820, 18 minutes total withdrawel time  . HYSTEROSCOPY W/D&C N/A 10/03/2013   Procedure: DILATATION AND CURETTAGE /HYSTEROSCOPY;  Surgeon: Tilda Burrow, MD;  Location: [facility];  Service: Gynecology;  Laterality: N/A;  . POLYPECTOMY N/A 10/03/2013   Procedure: REMOVAL OF ENDOMETRIAL POLYP;  Surgeon: Tilda Burrow, MD;   Location: [facility];  Service: Gynecology;  Laterality: N/A;  . POLYPECTOMY N/A 11/05/2014   Procedure: POLYPECTOMY;  Surgeon: Corbin Ade, MD;  Location: [facility];  Service: Endoscopy;  Laterality: N/A;    OB History    Gravida Para Term Preterm AB Living   2 2       2    SAB TAB Ectopic Multiple Live Births                   Home Medications    Prior to Admission medications   Medication Sig Start Date End Date Taking? Authorizing Provider  buPROPion (WELLBUTRIN XL) 300 MG 24 hr tablet Take 1 tablet (300 mg total) by mouth daily. 10/25/15  Yes Salley Scarlet, MD  clonazePAM (KLONOPIN) 1 MG tablet TAKE (1) TABLET BY MOUTH THREE TIMES DAILY AS NEEDED. 02/07/16  Yes Salley Scarlet, MD  Nutritional Supplements (JUICE PLUS FIBRE PO) Take 1 tablet by mouth daily.   Yes Historical Provider, MD  oseltamivir (TAMIFLU) 75 MG capsule Take 1 capsule (75 mg total) by mouth 2 (two) times daily. 03/22/16  Yes Junie Spencer, FNP  sertraline (ZOLOFT) 50 MG tablet Take 1 tablet (50 mg total) by mouth daily. 11/05/15  Yes Salley Scarlet, MD  zolpidem (AMBIEN) 10 MG tablet Take 1 tablet (10 mg total) by mouth at bedtime as needed for sleep. 01/26/16  Yes Salley Scarlet, MD  amoxicillin (AMOXIL) 500 MG capsule Take 2 capsules (  1,000 mg total) by mouth 2 (two) times daily. 03/24/16   Marily MemosJason Ghazal Pevey, MD  azithromycin (ZITHROMAX) 250 MG tablet Take 1 tablet (250 mg total) by mouth daily. Take 1 every day until finished. 03/24/16   Marily MemosJason Maxx [place], MD  benzonatate (TESSALON) 100 MG capsule Take 1 capsule (100 mg total) by mouth 3 (three) times daily as needed for cough. 03/24/16   Marily MemosJason Verdis Koval, MD  Chlorphen-PE-Acetaminophen (NOREL AD) 4-10-325 MG TABS 1 TABLET bid PRN 12/14/15   Salley ScarletKawanta F [place], MD  chlorpheniramine-HYDROcodone [facility](TUSSIONEX PENNKINETIC ER) 10-8 MG/5ML SUER Take 5 mLs by mouth every 12 (twelve) hours as needed for cough. 12/14/15   Salley ScarletKawanta F [place], MD  ondansetron (ZOFRAN) 4 MG tablet Take 1 tablet (4 mg  total) by mouth every 8 (eight) hours as needed for nausea or vomiting. 03/24/16   Marily MemosJason Dorian Duval, MD    Family History Family History  Problem Relation Age of Onset  . Diabetes Mother   . Alzheimer's disease Mother   . Cancer Father     lung   . Colon cancer Neg Hx     Social History Social History  Substance Use Topics  . Smoking status: Never Smoker  . Smokeless tobacco: Never Used  . Alcohol use No     Allergies   Patient has no known allergies.   Review of Systems Review of Systems  Constitutional: Positive for activity change, appetite change, chills, fatigue and fever.  Respiratory: Positive for cough and shortness of breath.   Gastrointestinal: Positive for abdominal pain (after vomiting), nausea and vomiting.  Genitourinary: Positive for flank pain, frequency and urgency.  Musculoskeletal: Positive for arthralgias, back pain and myalgias.  All other systems reviewed and are negative.    Physical Exam Updated Vital Signs BP 108/64   Pulse 111   Temp 97.9 F (36.6 C) (Oral)   Resp 23   Ht 5\' 3"  (1.6 m)   Wt 250 lb (113.4 kg)   LMP 05/25/2014   SpO2 96%   BMI 44.29 kg/m   Physical Exam  Constitutional: She is oriented to person, place, and time. She appears well-developed and well-nourished.  HENT:  Head: Normocephalic and atraumatic.  Eyes: Conjunctivae and EOM are normal.  Neck: Normal range of motion.  Cardiovascular: Regular rhythm.  Tachycardia present.   Pulmonary/Chest: No stridor. No respiratory distress.  Abdominal: She exhibits no distension.  Musculoskeletal: Normal range of motion. She exhibits tenderness (left back). She exhibits no edema or deformity.  Neurological: She is alert and oriented to person, place, and time. No cranial nerve deficit.  Nursing note and vitals reviewed.    ED Treatments / Results  Labs (all labs ordered are listed, but only abnormal results are displayed) Labs Reviewed  CBC WITH DIFFERENTIAL/PLATELET -  Abnormal; Notable for the following:       Result Value   WBC 11.3 (*)    Neutro Abs 9.5 (*)    All other components within normal limits  BASIC METABOLIC PANEL - Abnormal; Notable for the following:    Sodium 131 (*)    Chloride 100 (*)    Glucose, Bld 166 (*)    Calcium 8.6 (*)    All other components within normal limits  URINALYSIS, ROUTINE W REFLEX MICROSCOPIC - Abnormal; Notable for the following:    APPearance HAZY (*)    Protein, ur 30 (*)    Bacteria, UA RARE (*)    All other components within normal limits  URINE CULTURE  INFLUENZA PANEL  BY PCR (TYPE A & B, H1N1)    EKG  EKG Interpretation None       Radiology Dg Chest 2 View  Result Date: 03/24/2016 CLINICAL DATA:  Productive cough, short of breath fever . EXAM: CHEST  2 VIEW COMPARISON:  05/21/2013 FINDINGS: Normal cardiac size. LEFT retrocardiac opacity in a lobar distribution. No effusion. RIGHT lung clear. No pneumothorax. No acute osseous abnormality. IMPRESSION: LEFT lower lobe pneumonia. Followup PA and lateral chest X-ray is recommended in 3-4 weeks following trial of antibiotic therapy to ensure resolution and exclude underlying malignancy. Electronically Signed   By: Genevive Bi M.D.   On: 03/24/2016 08:15    Procedures Procedures (including critical care time)  Medications Ordered in ED Medications  acetaminophen (TYLENOL) tablet 650 mg (650 mg Oral Given 03/24/16 0732)  sodium chloride 0.9 % bolus 1,000 mL (0 mLs Intravenous Stopped 03/24/16 0903)  ondansetron (ZOFRAN) injection 4 mg (4 mg Intravenous Given 03/24/16 0732)  fentaNYL (SUBLIMAZE) injection 50 mcg (50 mcg Intravenous Given 03/24/16 0740)  ketorolac (TORADOL) 30 MG/ML injection 15 mg (15 mg Intravenous Given 03/24/16 0740)  benzonatate (TESSALON) capsule 100 mg (100 mg Oral Given 03/24/16 0740)  sodium chloride 0.9 % bolus 1,000 mL (0 mLs Intravenous Stopped 03/24/16 0959)  cefTRIAXone (ROCEPHIN) 1 g in dextrose 5 % 50 mL IVPB (0 g Intravenous  Stopped 03/24/16 0903)  azithromycin (ZITHROMAX) 500 mg in dextrose 5 % 250 mL IVPB (0 mg Intravenous Stopped 03/24/16 1021)     Initial Impression / Assessment and Plan / ED Course  I have reviewed the triage vital signs and the nursing notes.  Pertinent labs & imaging results that were available during my care of the patient were reviewed by me and considered in my medical decision making (see chart for details).  Clinical Course    Suspect influenza w/ vomiting from tamiflu v pyelo v pneumonia. Will treat symptomatically and disposition appropriately.  PNA on XR, treated for CAP. significnat improvement in all symptoms here. Tolerating PO. Will dc on zofran/amox/azithro/tessalon. Needs pcp follow up if not resolving in a few days, otherwise will return here if worsening.   Final Clinical Impressions(s) / ED Diagnoses   Final diagnoses:  Community acquired pneumonia of left lower lobe of lung (HCC)    New Prescriptions Discharge Medication List as of 03/24/2016 10:39 AM    START taking these medications   Details  amoxicillin (AMOXIL) 500 MG capsule Take 2 capsules (1,000 mg total) by mouth 2 (two) times daily., Starting Fri 03/24/2016, Print    benzonatate (TESSALON) 100 MG capsule Take 1 capsule (100 mg total) by mouth 3 (three) times daily as needed for cough., Starting Fri 03/24/2016, Print         Marily Memos, MD 03/24/16 [phone]

## 2016-03-24 NOTE — ED Triage Notes (Signed)
Patient started having flu-like symptoms on Wednesday did E-visit and was prescribed Tamiflu, continues to have muscle spasms, weakness, back pain, nausea and vomiting, afebrile.

## 2016-03-24 NOTE — ED Notes (Signed)
Gave pt a ginger ale for a fluid challenge 

## 2016-03-25 ENCOUNTER — Encounter: Payer: Self-pay | Admitting: Family Medicine

## 2016-03-25 LAB — URINE CULTURE: Culture: <10000 — AB

## 2016-03-27 ENCOUNTER — Emergency Department (HOSPITAL_COMMUNITY)
Admission: EM | Admit: 2016-03-27 | Discharge: 2016-03-27 | Disposition: A | Discharge status: Home / Self Care | Payer: PRIVATE HEALTH INSURANCE | Attending: Emergency Medicine | Admitting: Emergency Medicine

## 2016-03-27 ENCOUNTER — Encounter (HOSPITAL_COMMUNITY): Payer: Self-pay | Admitting: Emergency Medicine

## 2016-03-27 ENCOUNTER — Emergency Department (HOSPITAL_COMMUNITY): Payer: PRIVATE HEALTH INSURANCE

## 2016-03-27 DIAGNOSIS — J690 Pneumonitis due to inhalation of food and vomit: Secondary | ICD-10-CM | POA: Diagnosis not present

## 2016-03-27 DIAGNOSIS — Z79899 Other long term (current) drug therapy: Secondary | ICD-10-CM | POA: Insufficient documentation

## 2016-03-27 DIAGNOSIS — J189 Pneumonia, unspecified organism: Secondary | ICD-10-CM | POA: Diagnosis present

## 2016-03-27 LAB — CBC WITH DIFFERENTIAL/PLATELET
BASOS PCT: 0 %
Basophils Absolute: 0 10*3/uL (ref 0.0–0.1)
Eosinophils Absolute: 0.3 10*3/uL (ref 0.0–0.7)
Eosinophils Relative: 4 %
HEMATOCRIT: 37.6 % (ref 36.0–46.0)
HEMOGLOBIN: 12.4 g/dL (ref 12.0–15.0)
LYMPHS ABS: 2.3 10*3/uL (ref 0.7–4.0)
Lymphocytes Relative: 34 %
MCH: 29.8 pg (ref 26.0–34.0)
MCHC: 33 g/dL (ref 30.0–36.0)
MCV: 90.4 fL (ref 78.0–100.0)
MONO ABS: 0.6 10*3/uL (ref 0.1–1.0)
MONOS PCT: 9 %
NEUTROS PCT: 53 %
Neutro Abs: 3.6 10*3/uL (ref 1.7–7.7)
Platelets: 320 10*3/uL (ref 150–400)
RBC: 4.16 MIL/uL (ref 3.87–5.11)
RDW: 13.3 % (ref 11.5–15.5)
WBC: 6.8 10*3/uL (ref 4.0–10.5)

## 2016-03-27 LAB — COMPREHENSIVE METABOLIC PANEL
ALBUMIN: 3.4 g/dL — AB (ref 3.5–5.0)
ALK PHOS: 24 U/L — AB (ref 38–126)
ALT: 31 U/L (ref 14–54)
ANION GAP: 9 (ref 5–15)
AST: 18 U/L (ref 15–41)
BUN: 16 mg/dL (ref 6–20)
CALCIUM: 9.3 mg/dL (ref 8.9–10.3)
CHLORIDE: 102 mmol/L (ref 101–111)
CO2: 27 mmol/L (ref 22–32)
Creatinine, Ser: 0.83 mg/dL (ref 0.44–1.00)
GFR calc Af Amer: >60 mL/min (ref >60)
GFR calc non Af Amer: >60 mL/min (ref >60)
GLUCOSE: 99 mg/dL (ref 65–99)
Potassium: 3.9 mmol/L (ref 3.5–5.1)
SODIUM: 138 mmol/L (ref 135–145)
Total Bilirubin: 0.8 mg/dL (ref 0.3–1.2)
Total Protein: 7.8 g/dL (ref 6.5–8.1)

## 2016-03-27 MED ORDER — GUAIFENESIN-CODEINE 100-10 MG/5ML PO SOLN: 10.0000 mL | Freq: Three times a day (TID) | ORAL | 0 refills | Status: DC | PRN

## 2016-03-27 MED ORDER — ALBUTEROL SULFATE HFA 108 (90 BASE) MCG/ACT IN AERS
2.0000 | INHALATION_SPRAY | RESPIRATORY_TRACT | Status: DC | PRN
  Administered 2016-03-27: 2 via RESPIRATORY_TRACT
  Filled 2016-03-27: qty 6.7

## 2016-03-27 MED ORDER — IPRATROPIUM-ALBUTEROL 0.5-2.5 (3) MG/3ML IN SOLN
3.0000 mL | Freq: Once | RESPIRATORY_TRACT | Status: AC
  Administered 2016-03-27: 3 mL via RESPIRATORY_TRACT
  Filled 2016-03-27: qty 3

## 2016-03-27 MED ORDER — PREDNISONE 10 MG PO TABS: 20.0000 mg | ORAL_TABLET | Freq: Two times a day (BID) | ORAL | 0 refills | Status: DC

## 2016-03-27 NOTE — ED Provider Notes (Signed)
[facility] Provider Note   CSN: [id number] Arrival date & time: 03/27/16  1232     History   Chief Complaint Chief Complaint  Patient presents with  . Pneumonia    HPI Jenna Love is a 56 y.o. female.  Patient is a 56 year old female with history of prior pneumonia, anxiety. She presents for evaluation of cough, chest congestion, and shortness of breath. She was seen here 3 days ago with similar complaints and diagnosed with pneumonia. She was started on amoxicillin and Zithromax and has been taking both of these, however is not improving. She feels more short of breath today. She denies any fevers or productive cough, but does feel that her chest is congested and that she should be producing sputum. She denies any chest pain. She denies any leg pain or calf swelling.      Past Medical History:  Diagnosis Date  . Anxiety   . Panic attacks   . Pneumonia   . Thyroid nodule 2013   Benign biopsy- ENT Dr. Pollyann Kennedy    Patient Active Problem List   Diagnosis Date Noted  . History of colonic polyps   . Encounter for screening colonoscopy 10/13/2014  . High risk medication use 10/13/2014  . Peripheral edema 05/06/2014  . SOB (shortness of breath) 05/06/2014  . Post-menopausal bleeding 08/20/2013  . Chronic cough 05/20/2013  . Thyroid nodule 04/15/2013  . Hypertriglyceridemia 02/21/2013  . Perimenopausal 09/18/2011  . MDD (major depressive disorder) (HCC) 06/29/2011  . Menorrhagia 06/15/2011  . Insomnia 04/14/2011  . Morbid obesity (HCC) 04/14/2011  . GAD (generalized anxiety disorder) 10/23/2009    Past Surgical History:  Procedure Laterality Date  . CESAREAN SECTION  1992  . COLONOSCOPY WITH PROPOFOL N/A 11/05/2014   Procedure: COLONOSCOPY WITH PROPOFOL;  Surgeon: Corbin Ade, MD;  Location: [facility];  Service: Endoscopy;  Laterality: N/A;  in cecum at 0820, 18 minutes total withdrawel time  . HYSTEROSCOPY W/D&C N/A 10/03/2013   Procedure: DILATATION AND  CURETTAGE /HYSTEROSCOPY;  Surgeon: Tilda Burrow, MD;  Location: [facility];  Service: Gynecology;  Laterality: N/A;  . POLYPECTOMY N/A 10/03/2013   Procedure: REMOVAL OF ENDOMETRIAL POLYP;  Surgeon: Tilda Burrow, MD;  Location: [facility];  Service: Gynecology;  Laterality: N/A;  . POLYPECTOMY N/A 11/05/2014   Procedure: POLYPECTOMY;  Surgeon: Corbin Ade, MD;  Location: [facility];  Service: Endoscopy;  Laterality: N/A;    OB History    Gravida Para Term Preterm AB Living   2 2       2    SAB TAB Ectopic Multiple Live Births                   Home Medications    Prior to Admission medications   Medication Sig Start Date End Date Taking? Authorizing Provider  amoxicillin (AMOXIL) 500 MG capsule Take 2 capsules (1,000 mg total) by mouth 2 (two) times daily. 03/24/16   Marily Memos, MD  azithromycin (ZITHROMAX) 250 MG tablet Take 1 tablet (250 mg total) by mouth daily. Take 1 every day until finished. 03/24/16   Marily Memos, MD  benzonatate (TESSALON) 100 MG capsule Take 1 capsule (100 mg total) by mouth 3 (three) times daily as needed for cough. 03/24/16   Marily Memos, MD  buPROPion (WELLBUTRIN XL) 300 MG 24 hr tablet Take 1 tablet (300 mg total) by mouth daily. 10/25/15   Salley Scarlet, MD  Chlorphen-PE-Acetaminophen (NOREL AD) 4-10-325 MG TABS 1 TABLET bid PRN  12/14/15   Salley ScarletKawanta F [place], MD  chlorpheniramine-HYDROcodone [facility](TUSSIONEX PENNKINETIC ER) 10-8 MG/5ML SUER Take 5 mLs by mouth every 12 (twelve) hours as needed for cough. 12/14/15   Salley ScarletKawanta F [place], MD  clonazePAM (KLONOPIN) 1 MG tablet TAKE (1) TABLET BY MOUTH THREE TIMES DAILY AS NEEDED. 02/07/16   Salley ScarletKawanta F [place], MD  Nutritional Supplements (JUICE PLUS FIBRE PO) Take 1 tablet by mouth daily.    Historical Provider, MD  ondansetron (ZOFRAN) 4 MG tablet Take 1 tablet (4 mg total) by mouth every 8 (eight) hours as needed for nausea or vomiting. 03/24/16   Marily MemosJason [place], MD  oseltamivir (TAMIFLU) 75 MG capsule Take 1 capsule (75 mg total) by  mouth 2 (two) times daily. 03/22/16   Junie Spencerhristy A Hawks, FNP  sertraline (ZOLOFT) 50 MG tablet Take 1 tablet (50 mg total) by mouth daily. 11/05/15   Salley ScarletKawanta F [place], MD  zolpidem (AMBIEN) 10 MG tablet Take 1 tablet (10 mg total) by mouth at bedtime as needed for sleep. 01/26/16   Salley ScarletKawanta F [place], MD    Family History Family History  Problem Relation Age of Onset  . Diabetes Mother   . Alzheimer's disease Mother   . Cancer Father     lung   . Colon cancer Neg Hx     Social History Social History  Substance Use Topics  . Smoking status: Never Smoker  . Smokeless tobacco: Never Used  . Alcohol use No     Allergies   Patient has no known allergies.   Review of Systems Review of Systems  All other systems reviewed and are negative.    Physical Exam Updated Vital Signs BP 128/83   Pulse 99   Temp 98.1 F (36.7 C) (Oral)   Resp 24   Ht 5\' 3"  (1.6 m)   Wt 250 lb (113.4 kg)   LMP 05/25/2014   SpO2 96%   BMI 44.29 kg/m   Physical Exam  Constitutional: She is oriented to person, place, and time. She appears well-developed and well-nourished. No distress.  HENT:  Head: Normocephalic and atraumatic.  Neck: Normal range of motion. Neck supple.  Cardiovascular: Normal rate and regular rhythm.  Exam reveals no gallop and no friction rub.   No murmur heard. Pulmonary/Chest: Effort normal and breath sounds normal. No respiratory distress. She has no wheezes.  Abdominal: Soft. Bowel sounds are normal. She exhibits no distension. There is no tenderness.  Musculoskeletal: Normal range of motion. She exhibits no edema.  There is no calf swelling or tenderness. Homans sign is absent bilaterally.  Neurological: She is alert and oriented to person, place, and time.  Skin: Skin is warm and dry. She is not diaphoretic.  Nursing note and vitals reviewed.    ED Treatments / Results  Labs (all labs ordered are listed, but only abnormal results are displayed) Labs Reviewed    COMPREHENSIVE METABOLIC PANEL - Abnormal; Notable for the following:       Result Value   Albumin 3.4 (*)    Alkaline Phosphatase 24 (*)    All other components within normal limits  CBC WITH DIFFERENTIAL/PLATELET    EKG  EKG Interpretation None       Radiology Dg Chest 2 View  Result Date: 03/27/2016 CLINICAL DATA:  Pneumonia.  Follow-up . EXAM: CHEST  2 VIEW COMPARISON:  03/24/2016. FINDINGS: Mediastinum hilar structures normal. Left lower lobe infiltrate noted consistent pneumonia. Minimal lateral clearing. No prominent pleural effusion or pneumothorax. No acute bony abnormality.  Degenerative changes thoracic spine IMPRESSION: Minimal interval clearing left lower lobe infiltrate . Electronically Signed   By: Maisie Fus  Register   On: 03/27/2016 13:24    Procedures Procedures (including critical care time)  Medications Ordered in ED Medications  ipratropium-albuterol (DUONEB) 0.5-2.5 (3) MG/3ML nebulizer solution 3 mL (not administered)     Initial Impression / Assessment and Plan / ED Course  I have reviewed the triage vital signs and the nursing notes.  Pertinent labs & imaging results that were available during my care of the patient were reviewed by me and considered in my medical decision making (see chart for details).  Clinical Course     Patient presents with complaints of chest congestion and cough. She was diagnosed with pneumonia 3 days ago. She was started on antibiotics, however is not improving. Her x-ray today reveals slight interval improvement from before. There is no hypoxia, fever, and she appears nontoxic. He has only had 3 days worth of antibiotics and I will advise her to continue this. She was given a nebulizer treatment in the ER which did help her somewhat. She will be discharged with an albuterol inhaler, prednisone, cough medication, and follow-up with primary Dr. as needed.  Final Clinical Impressions(s) / ED Diagnoses   Final diagnoses:  None     New Prescriptions New Prescriptions   No medications on file     Geoffery Lyons, MD 03/27/16 2103

## 2016-03-27 NOTE — Discharge Instructions (Signed)
Continue antibiotics as previously prescribed.  Begin taking prednisone.  Robitussin with codeine as prescribed as needed for cough.  Return to the ER if symptoms significantly worsen or change.

## 2016-03-27 NOTE — ED Triage Notes (Signed)
PT states was dx with left pneumonia on 03/24/16 in the ED and states is on two antibiotics with no relief. PT c/o generalized weakness and some SOB on exertion. PT states non-productive cough.

## 2016-03-28 ENCOUNTER — Encounter: Payer: Self-pay | Admitting: Family Medicine

## 2016-03-28 ENCOUNTER — Ambulatory Visit: Payer: Self-pay | Admitting: Family Medicine

## 2016-03-29 MED ORDER — SERTRALINE HCL 50 MG PO TABS: 50.0000 mg | ORAL_TABLET | Freq: Every day | ORAL | 3 refills | Status: DC

## 2016-04-04 ENCOUNTER — Telehealth: Payer: Self-pay | Admitting: *Deleted

## 2016-04-04 ENCOUNTER — Ambulatory Visit (INDEPENDENT_AMBULATORY_CARE_PROVIDER_SITE_OTHER): Payer: PRIVATE HEALTH INSURANCE | Admitting: Family Medicine

## 2016-04-04 ENCOUNTER — Encounter: Payer: Self-pay | Admitting: Family Medicine

## 2016-04-04 VITALS — BP 128/78 | HR 94 | Temp 98.2°F | Resp 18 | Ht 63.0 in | Wt 260.0 lb

## 2016-04-04 DIAGNOSIS — J189 Pneumonia, unspecified organism: Secondary | ICD-10-CM

## 2016-04-04 MED ORDER — HYDROCOD POLST-CPM POLST ER 10-8 MG/5ML PO SUER: 5.0000 mL | Freq: Two times a day (BID) | ORAL | 0 refills | Status: DC | PRN

## 2016-04-04 MED ORDER — LEVOFLOXACIN 500 MG PO TABS: 500.0000 mg | ORAL_TABLET | Freq: Every day | ORAL | 0 refills | Status: DC

## 2016-04-04 NOTE — Patient Instructions (Addendum)
Take the cough medicine as prescribed Take the levaquin CT of chest to be done 4 weeks FMLA to be sent  F/U as needed

## 2016-04-04 NOTE — Progress Notes (Signed)
   Subjective:    Patient ID: Jenna Love, female    DOB: 10-15-1960, 56 y.o.   MRN: [id number]  Patient presents for ER F/U (PNA- continues to have nonproductive cough and msucle spasm like feeling in lung)   did E- visit on 1/3 given Tamiflu, went to ER on 1/5 after worsening cough, SOB, back pain, had tachycardia , influenza was negative, WBC slightly elevated 11.3, sodium was 131 , LL pneumonia noted on CXR , repeat CXR on 1/8 showed minimal clearing of LL pneumonia . Give amoxicillin/azithromycin on 1/5, and completed tamiflu  Prednisone and codiene based   Grandchildren had colds over the holidays  She continues to have pain in her left lower back especially when she takes a deep breath. She is also coughing but is not getting any production. She has not had any further fever muscle does not feel like she is improved a lot. She is eating and drinking as normally     Review Of Systems:  GEN- + fatigue, fever, weight loss,weakness, recent illness HEENT- denies eye drainage, change in vision, nasal discharge, CVS- denies chest pain, palpitations RESP- denies SOB, +cough, wheeze ABD- denies N/V, change in stools, abd pain GU- denies dysuria, hematuria, dribbling, incontinence MSK- denies joint pain, muscle aches, injury Neuro- denies headache, dizziness, syncope, seizure activity       Objective:    BP 128/78 (BP Location: Left Arm, Patient Position: Sitting, Cuff Size: Large)   Pulse 94   Temp 98.2 F (36.8 C) (Oral)   Resp 18   Ht 5\' 3"  (1.6 m)   Wt 260 lb (117.9 kg)   LMP 05/25/2014   SpO2 98%   BMI 46.06 kg/m  GEN- NAD, alert and oriented x3 HEENT- PERRL, EOMI, non injected sclera, pink conjunctiva, MMM, oropharynx clear Neck- Supple, no LAD  CVS- RRR, no murmur RESP-rales Left Lower lung, normal WOB, harsh cough, no retractions, good air movement no wheeze  Psych- normal affect and mood  EXT- No edema Pulses- Radial,2+        Assessment & Plan:      Problem List Items Addressed This Visit    None    Visit Diagnoses    Community acquired pneumonia of left lung, unspecified part of lung    -  Primary   CAP, she has had PNA three times in [place], may be coincidance, but she is concerned as father had lung cancer. She is non smoker. For her f/u imaging use CT of chest to ensure nothing else in parenchyma. She has not had any nodules noted on previous CXR  Will give course of levaquin, not improved, also tussionex   Relevant Medications   chlorpheniramine-HYDROcodone (TUSSIONEX PENNKINETIC ER) 10-8 MG/5ML SUER   levofloxacin (LEVAQUIN) 500 MG tablet      Note: This dictation was prepared with Dragon dictation along with smaller phrase technology. Any transcriptional errors that result from this process are unintentional.

## 2016-04-04 NOTE — Telephone Encounter (Signed)
Received FMLA forms from patient.  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   Forms routed to provider.

## 2016-04-10 ENCOUNTER — Encounter: Payer: Self-pay | Admitting: Family Medicine

## 2016-04-10 NOTE — Telephone Encounter (Signed)
Late documentation for 04/04/2016:  Received completed FMLA  from provider.   No charge per provider.   Faxed to [employer] [place].

## 2016-04-12 ENCOUNTER — Emergency Department (HOSPITAL_COMMUNITY): Payer: PRIVATE HEALTH INSURANCE

## 2016-04-12 ENCOUNTER — Encounter: Payer: Self-pay | Admitting: Family Medicine

## 2016-04-12 ENCOUNTER — Inpatient Hospital Stay (HOSPITAL_COMMUNITY): Payer: PRIVATE HEALTH INSURANCE

## 2016-04-12 ENCOUNTER — Encounter (HOSPITAL_COMMUNITY): Payer: Self-pay | Admitting: Emergency Medicine

## 2016-04-12 ENCOUNTER — Inpatient Hospital Stay (HOSPITAL_COMMUNITY)
Admission: EM | Admit: 2016-04-12 | Discharge: 2016-04-13 | DRG: 204 | Disposition: A | Discharge status: Home / Self Care | Payer: PRIVATE HEALTH INSURANCE | Attending: Internal Medicine | Admitting: Internal Medicine

## 2016-04-12 DIAGNOSIS — Z8701 Personal history of pneumonia (recurrent): Secondary | ICD-10-CM | POA: Diagnosis not present

## 2016-04-12 DIAGNOSIS — R911 Solitary pulmonary nodule: Secondary | ICD-10-CM | POA: Diagnosis present

## 2016-04-12 DIAGNOSIS — F411 Generalized anxiety disorder: Secondary | ICD-10-CM | POA: Diagnosis present

## 2016-04-12 DIAGNOSIS — R0602 Shortness of breath: Secondary | ICD-10-CM | POA: Diagnosis not present

## 2016-04-12 DIAGNOSIS — Z833 Family history of diabetes mellitus: Secondary | ICD-10-CM | POA: Diagnosis not present

## 2016-04-12 DIAGNOSIS — Z801 Family history of malignant neoplasm of trachea, bronchus and lung: Secondary | ICD-10-CM

## 2016-04-12 DIAGNOSIS — F329 Major depressive disorder, single episode, unspecified: Secondary | ICD-10-CM | POA: Diagnosis present

## 2016-04-12 DIAGNOSIS — Z6841 Body Mass Index (BMI) 40.0 and over, adult: Secondary | ICD-10-CM

## 2016-04-12 DIAGNOSIS — M25561 Pain in right knee: Secondary | ICD-10-CM | POA: Diagnosis present

## 2016-04-12 DIAGNOSIS — F410 Panic disorder [episodic paroxysmal anxiety] without agoraphobia: Secondary | ICD-10-CM | POA: Diagnosis present

## 2016-04-12 DIAGNOSIS — R7989 Other specified abnormal findings of blood chemistry: Secondary | ICD-10-CM

## 2016-04-12 DIAGNOSIS — M25562 Pain in left knee: Secondary | ICD-10-CM

## 2016-04-12 DIAGNOSIS — R0600 Dyspnea, unspecified: Secondary | ICD-10-CM | POA: Diagnosis not present

## 2016-04-12 LAB — CBC WITH DIFFERENTIAL/PLATELET
BASOS ABS: 0 10*3/uL (ref 0.0–0.1)
Basophils Relative: 0 %
Eosinophils Absolute: 0.3 10*3/uL (ref 0.0–0.7)
Eosinophils Relative: 5 %
HEMATOCRIT: 39.2 % (ref 36.0–46.0)
Hemoglobin: 13.1 g/dL (ref 12.0–15.0)
LYMPHS ABS: 2.4 10*3/uL (ref 0.7–4.0)
LYMPHS PCT: 38 %
MCH: 29.7 pg (ref 26.0–34.0)
MCHC: 33.4 g/dL (ref 30.0–36.0)
MCV: 88.9 fL (ref 78.0–100.0)
Monocytes Absolute: 0.7 10*3/uL (ref 0.1–1.0)
Monocytes Relative: 12 %
NEUTROS ABS: 2.8 10*3/uL (ref 1.7–7.7)
Neutrophils Relative %: 45 %
Platelets: 262 10*3/uL (ref 150–400)
RBC: 4.41 MIL/uL (ref 3.87–5.11)
RDW: 13.3 % (ref 11.5–15.5)
WBC: 6.2 10*3/uL (ref 4.0–10.5)

## 2016-04-12 LAB — BRAIN NATRIURETIC PEPTIDE: B Natriuretic Peptide: 16 pg/mL (ref 0.0–100.0)

## 2016-04-12 LAB — COMPREHENSIVE METABOLIC PANEL
ALBUMIN: 3.7 g/dL (ref 3.5–5.0)
ALK PHOS: 23 U/L — AB (ref 38–126)
ALT: 23 U/L (ref 14–54)
ANION GAP: 9 (ref 5–15)
AST: 24 U/L (ref 15–41)
BUN: 14 mg/dL (ref 6–20)
CALCIUM: 9.1 mg/dL (ref 8.9–10.3)
CO2: 25 mmol/L (ref 22–32)
CREATININE: 0.78 mg/dL (ref 0.44–1.00)
Chloride: 103 mmol/L (ref 101–111)
GFR calc Af Amer: >60 mL/min (ref >60)
GFR calc non Af Amer: >60 mL/min (ref >60)
GLUCOSE: 98 mg/dL (ref 65–99)
Potassium: 3.6 mmol/L (ref 3.5–5.1)
SODIUM: 137 mmol/L (ref 135–145)
Total Bilirubin: 0.8 mg/dL (ref 0.3–1.2)
Total Protein: 7.1 g/dL (ref 6.5–8.1)

## 2016-04-12 LAB — TROPONIN I: Troponin I: <0.03 ng/mL (ref <0.03)

## 2016-04-12 LAB — D-DIMER, QUANTITATIVE: D-Dimer, Quant: 1 ug/mL-FEU — ABNORMAL HIGH (ref 0.00–0.50)

## 2016-04-12 MED ORDER — CLONAZEPAM 0.5 MG PO TABS
1.0000 mg | ORAL_TABLET | Freq: Every day | ORAL | Status: DC
  Administered 2016-04-12: 1 mg via ORAL
  Filled 2016-04-12: qty 2

## 2016-04-12 MED ORDER — SODIUM CHLORIDE 0.9 % IV BOLUS (SEPSIS)
1000.0000 mL | Freq: Once | INTRAVENOUS | Status: AC
  Administered 2016-04-12: 1000 mL via INTRAVENOUS

## 2016-04-12 MED ORDER — ACETAMINOPHEN 325 MG PO TABS
650.0000 mg | ORAL_TABLET | Freq: Four times a day (QID) | ORAL | Status: DC | PRN
Start: 2016-04-12 — End: 2016-04-13

## 2016-04-12 MED ORDER — IOPAMIDOL (ISOVUE-370) INJECTION 76%
100.0000 mL | Freq: Once | INTRAVENOUS | Status: AC | PRN
  Administered 2016-04-12: 100 mL via INTRAVENOUS

## 2016-04-12 MED ORDER — ZOLPIDEM TARTRATE 5 MG PO TABS
10.0000 mg | ORAL_TABLET | Freq: Every evening | ORAL | Status: DC | PRN
  Administered 2016-04-12: 10 mg via ORAL
  Filled 2016-04-12: qty 2

## 2016-04-12 MED ORDER — POTASSIUM CHLORIDE IN NACL 20-0.9 MEQ/L-% IV SOLN
INTRAVENOUS | Status: DC
  Administered 2016-04-12 – 2016-04-13 (×2): via INTRAVENOUS

## 2016-04-12 MED ORDER — ENOXAPARIN SODIUM 120 MG/0.8ML ~~LOC~~ SOLN
1.0000 mg/kg | Freq: Two times a day (BID) | SUBCUTANEOUS | Status: DC
  Administered 2016-04-13: 110 mg via SUBCUTANEOUS
  Filled 2016-04-12 (×4): qty 0.8

## 2016-04-12 MED ORDER — ACETAMINOPHEN 650 MG RE SUPP: 650.0000 mg | Freq: Four times a day (QID) | RECTAL | Status: DC | PRN

## 2016-04-12 MED ORDER — SERTRALINE HCL 50 MG PO TABS
50.0000 mg | ORAL_TABLET | Freq: Every day | ORAL | Status: DC
  Administered 2016-04-12: 50 mg via ORAL
  Filled 2016-04-12 (×2): qty 1

## 2016-04-12 MED ORDER — GUAIFENESIN-DM 100-10 MG/5ML PO SYRP
5.0000 mL | ORAL_SOLUTION | ORAL | Status: DC | PRN
  Administered 2016-04-12: 5 mL via ORAL
  Filled 2016-04-12: qty 5

## 2016-04-12 MED ORDER — ALBUTEROL SULFATE (2.5 MG/3ML) 0.083% IN NEBU: 2.5000 mg | INHALATION_SOLUTION | Freq: Three times a day (TID) | RESPIRATORY_TRACT | Status: DC | PRN

## 2016-04-12 MED ORDER — ENOXAPARIN SODIUM 120 MG/0.8ML ~~LOC~~ SOLN
1.0000 mg/kg | Freq: Once | SUBCUTANEOUS | Status: AC
  Administered 2016-04-12: 110 mg via SUBCUTANEOUS
  Filled 2016-04-12: qty 0.8

## 2016-04-12 MED ORDER — POLYETHYLENE GLYCOL 3350 17 G PO PACK: 17.0000 g | PACK | Freq: Every day | ORAL | Status: DC | PRN

## 2016-04-12 MED ORDER — SODIUM CHLORIDE 0.9% FLUSH: 3.0000 mL | Freq: Two times a day (BID) | INTRAVENOUS | Status: DC

## 2016-04-12 NOTE — Progress Notes (Signed)
ANTICOAGULATION CONSULT NOTE - Initial Consult  Pharmacy Consult for Lovenox (enoxaparin) Indication: pulmonary embolus  No Known Allergies  Patient Measurements: Height: 5\' 3"  (160 cm) Weight: 240 lb (108.9 kg) IBW/kg (Calculated) : 52.4  Vital Signs: Temp: 98.1 F (36.7 C) (01/24 1702) Temp Source: Oral (01/24 1702) BP: 115/77 (01/24 1600) Pulse Rate: 94 (01/24 1600)  Labs:  Recent Labs  04/12/16 1021  HGB 13.1  HCT 39.2  PLT 262  CREATININE 0.78  TROPONINI <0.03    Estimated Creatinine Clearance: 96.3 mL/min (by C-G formula based on SCr of 0.78 mg/dL).   Medical History: Past Medical History:  Diagnosis Date  . Anxiety   . Panic attacks   . Pneumonia   . Thyroid nodule 2013   Benign biopsy- ENT Dr. Pollyann Kennedyosen    Medications:  Prescriptions Prior to Admission  Medication Sig Dispense Refill Last Dose  . clonazePAM (KLONOPIN) 1 MG tablet TAKE (1) TABLET BY MOUTH THREE TIMES DAILY AS NEEDED. (Patient taking differently: Take 1 mg by mouth 3 (three) times daily as needed for anxiety. TAKE (1) TABLET BY MOUTH THREE TIMES DAILY AS NEEDED.) 90 tablet 0 04/11/2016 at Unknown time  . Nutritional Supplements (JUICE PLUS FIBRE PO) Take 1 tablet by mouth daily.   04/11/2016 at Unknown time  . ondansetron (ZOFRAN) 4 MG tablet Take 1 tablet (4 mg total) by mouth every 8 (eight) hours as needed for nausea or vomiting. 12 tablet 0 unknown  . sertraline (ZOLOFT) 50 MG tablet Take 1 tablet (50 mg total) by mouth daily. 30 tablet 3 04/11/2016 at Unknown time  . zolpidem (AMBIEN) 10 MG tablet Take 1 tablet (10 mg total) by mouth at bedtime as needed for sleep. 30 tablet 3 04/11/2016 at Unknown time  . chlorpheniramine-HYDROcodone (TUSSIONEX PENNKINETIC ER) 10-8 MG/5ML SUER Take 5 mLs by mouth every 12 (twelve) hours as needed. (Patient not taking: Reported on 04/12/2016) 140 mL 0 Completed Course at Unknown time  . levofloxacin (LEVAQUIN) 500 MG tablet Take 1 tablet (500 mg total) by mouth  daily. (Patient not taking: Reported on 04/12/2016) 7 tablet 0 Completed Course at Unknown time    Assessment: 56 yo female presents to ED with SOB . Completed treatment for LLL PNA  diagnosed 03/24/16 with Levaquin, azithromycin and amoxicillin. D-dimer mildly elevated , CT angiogram chest- sub-optimal exam, due to inadequate pulmonary artery opacification, but concerning for small peripheral pulmonary embolus. Plan to continue therapeutic anticoagulation with Lovenox by pharmacy 1 mg/kg every 12 hourly.  Goal of Therapy:  Monitor platelets by anticoagulation protocol: Yes   Plan:  Lovenox (enoxaparin) 1mg /kg (dose = 110mg ) SQ q12 hours Monitor for s/s of bleeding  Elder CyphersLorie Ramell Wacha, BS Loura BackPharm D, BCPS Clinical Pharmacist Pager [phone] 04/12/2016,5:24 PM

## 2016-04-12 NOTE — ED Triage Notes (Signed)
Was dx on Jan 5th here with PNA.  Has had four rounds of antibiotic and not getting any better.  Coughing and pain is in right side now.  Pt says she has to sat up at night to sleep.

## 2016-04-12 NOTE — ED Notes (Signed)
Pt taken to CT.

## 2016-04-12 NOTE — ED Notes (Signed)
Pt in US

## 2016-04-12 NOTE — ED Notes (Signed)
Pt in xray

## 2016-04-12 NOTE — ED Notes (Signed)
Pt taken to xray 

## 2016-04-12 NOTE — Progress Notes (Signed)
Went to room due to an ABG order. Assessed patient. She is on RA with no shortness of breath noted and no respiratory distress. The patient refused ABG. SPO2 99%, HR 110. I contacted the ordering physician to let her know of the patients refusal.

## 2016-04-12 NOTE — ED Provider Notes (Addendum)
[facility] Provider Note   CSN: [id number] Arrival date & time: 04/12/16  0944   By signing my name below, I, Cynda AcresHailei Fulton, attest that this documentation has been prepared under the direction and in the presence of Azalia BilisKevin Azarria Balint, MD. Electronically Signed: Cynda AcresHailei Fulton, Scribe. 04/12/16. 10:15 AM.  History   Chief Complaint Chief Complaint  Patient presents with  . Pneumonia    HPI Comments: Jenna Love is a 56 y.o. female who presents to the Emergency Department complaining of right sided flank pain that began January 5th. Patient states she was here 3 weeks ago, in which she was diagnosed with pneumonia. She was prescribed four different types for antibiotics. Patient has associated shortness of breath, non-productive cough, trouble sleeping, and right posterior leg swelling. She describes her pain as sharp. No improvement with antibiotics. She denies any other symptoms.   The history is provided by the patient. No language interpreter was used.    Past Medical History:  Diagnosis Date  . Anxiety   . Panic attacks   . Pneumonia   . Thyroid nodule 2013   Benign biopsy- ENT Dr. Pollyann Kennedyosen    Patient Active Problem List   Diagnosis Date Noted  . History of colonic polyps   . Encounter for screening colonoscopy 10/13/2014  . High risk medication use 10/13/2014  . Peripheral edema 05/06/2014  . SOB (shortness of breath) 05/06/2014  . Post-menopausal bleeding 08/20/2013  . Chronic cough 05/20/2013  . Thyroid nodule 04/15/2013  . Hypertriglyceridemia 02/21/2013  . Perimenopausal 09/18/2011  . MDD (major depressive disorder) (HCC) 06/29/2011  . Menorrhagia 06/15/2011  . Insomnia 04/14/2011  . Morbid obesity (HCC) 04/14/2011  . GAD (generalized anxiety disorder) 10/23/2009    Past Surgical History:  Procedure Laterality Date  . CESAREAN SECTION  1992  . COLONOSCOPY WITH PROPOFOL N/A 11/05/2014   Procedure: COLONOSCOPY WITH PROPOFOL;  Surgeon: Corbin Adeobert M Rourk,  MD;  Location: [facility];  Service: Endoscopy;  Laterality: N/A;  in cecum at 0820, 18 minutes total withdrawel time  . HYSTEROSCOPY W/D&C N/A 10/03/2013   Procedure: DILATATION AND CURETTAGE /HYSTEROSCOPY;  Surgeon: Tilda BurrowJohn Ferguson V, MD;  Location: [facility];  Service: Gynecology;  Laterality: N/A;  . POLYPECTOMY N/A 10/03/2013   Procedure: REMOVAL OF ENDOMETRIAL POLYP;  Surgeon: Tilda BurrowJohn Ferguson V, MD;  Location: [facility];  Service: Gynecology;  Laterality: N/A;  . POLYPECTOMY N/A 11/05/2014   Procedure: POLYPECTOMY;  Surgeon: Corbin Adeobert M Rourk, MD;  Location: [facility];  Service: Endoscopy;  Laterality: N/A;    OB History    Gravida Para Term Preterm AB Living   2 2       2    SAB TAB Ectopic Multiple Live Births                   Home Medications    Prior to Admission medications   Medication Sig Start Date End Date Taking? Authorizing Provider  chlorpheniramine-HYDROcodone (TUSSIONEX PENNKINETIC ER) 10-8 MG/5ML SUER Take 5 mLs by mouth every 12 (twelve) hours as needed. 04/04/16   Salley ScarletKawanta F [place], MD  clonazePAM (KLONOPIN) 1 MG tablet TAKE (1) TABLET BY MOUTH THREE TIMES DAILY AS NEEDED. 02/07/16   Salley ScarletKawanta F [place], MD  levofloxacin (LEVAQUIN) 500 MG tablet Take 1 tablet (500 mg total) by mouth daily. 04/04/16   Salley ScarletKawanta F [place], MD  Nutritional Supplements (JUICE PLUS FIBRE PO) Take 1 tablet by mouth daily.    Historical Provider, MD  ondansetron (ZOFRAN) 4 MG tablet Take 1  tablet (4 mg total) by mouth every 8 (eight) hours as needed for nausea or vomiting. 03/24/16   Marily Memos, MD  sertraline (ZOLOFT) 50 MG tablet Take 1 tablet (50 mg total) by mouth daily. 03/29/16   Salley Scarlet, MD  zolpidem (AMBIEN) 10 MG tablet Take 1 tablet (10 mg total) by mouth at bedtime as needed for sleep. 01/26/16   Salley Scarlet, MD    Family History Family History  Problem Relation Age of Onset  . Diabetes Mother   . Alzheimer's disease Mother   . Cancer Father     lung   . Colon cancer Neg Hx     Social  History Social History  Substance Use Topics  . Smoking status: Never Smoker  . Smokeless tobacco: Never Used  . Alcohol use No     Allergies   Patient has no known allergies.   Review of Systems Review of Systems  A complete 10 system review of systems was obtained and all systems are negative except as noted in the HPI and PMH.   Physical Exam Updated Vital Signs BP 128/68 (BP Location: Left Arm)   Pulse 104   Temp 97.9 F (36.6 C) (Oral)   Resp 18   Ht 5\' 3"  (1.6 m)   Wt 240 lb (108.9 kg)   LMP 05/25/2014   SpO2 97%   BMI 42.51 kg/m   Physical Exam  Constitutional: She is oriented to person, place, and time. She appears well-developed and well-nourished. No distress.  HENT:  Head: Normocephalic and atraumatic.  Eyes: EOM are normal.  Neck: Normal range of motion.  Cardiovascular: Normal rate, regular rhythm and normal heart sounds.   Pulmonary/Chest: Effort normal and breath sounds normal.  Speaks in short sentences.   Abdominal: Soft. She exhibits no distension. There is no tenderness.  Musculoskeletal: Normal range of motion.  Neurological: She is alert and oriented to person, place, and time.  Skin: Skin is warm and dry.  Psychiatric: She has a normal mood and affect. Judgment normal.  Nursing note and vitals reviewed.    ED Treatments / Results  DIAGNOSTIC STUDIES: Oxygen Saturation is 97% on RA, normal by my interpretation.    COORDINATION OF CARE: 10:14 AM Discussed treatment plan with pt at bedside and pt agreed to plan.  Labs (all labs ordered are listed, but only abnormal results are displayed) Labs Reviewed  COMPREHENSIVE METABOLIC PANEL - Abnormal; Notable for the following:       Result Value   Alkaline Phosphatase 23 (*)    All other components within normal limits  D-DIMER, QUANTITATIVE (NOT AT [facility]) - Abnormal; Notable for the following:    D-Dimer, Quant 1.00 (*)    All other components within normal limits  CBC WITH  DIFFERENTIAL/PLATELET  BRAIN NATRIURETIC PEPTIDE  TROPONIN I    EKG  EKG Interpretation  Date/Time:  Wednesday April 12 2016 10:24:47 EST Ventricular Rate:  90 PR Interval:    QRS Duration: 70 QT Interval:  350 QTC Calculation: 429 R Axis:   62 Text Interpretation:  Sinus rhythm Low voltage, precordial leads Borderline T abnormalities, anterior leads No significant change was found Confirmed by Ireoluwa Gorsline  MD, Caryn Bee ([id number]) on 04/12/2016 2:05:17 PM       Radiology Dg Chest 2 View  Result Date: 04/12/2016 CLINICAL DATA:  Cough, shortness of breath. EXAM: CHEST  2 VIEW COMPARISON:  Radiographs of March 27, 2016. FINDINGS: The heart size and mediastinal contours are within normal limits.  No pneumothorax or pleural effusion is noted. Right lung is clear. Mild left basilar opacity is noted which is significantly improved compared to prior exam. The visualized skeletal structures are unremarkable. IMPRESSION: Significantly improved left basilar opacity suggesting resolving atelectasis or pneumonia. Continued radiographic follow-up is recommended to ensure resolution. Electronically Signed   By: Lupita Raider, M.D.   On: 04/12/2016 10:53   Ct Angio Chest Pe W And/or Wo Contrast  Result Date: 04/12/2016 CLINICAL DATA:  Recent pneumonia. Chest pain and shortness of breath. EXAM: CT ANGIOGRAPHY CHEST WITH CONTRAST TECHNIQUE: Multidetector CT imaging of the chest was performed using the standard protocol during bolus administration of intravenous contrast. Multiplanar CT image reconstructions and MIPs were obtained to evaluate the vascular anatomy. CONTRAST:  100 cc of Isovue 370 COMPARISON:  None FINDINGS: Cardiovascular: The heart size is normal. No pericardial effusion. Mild aortic atherosclerosis. Sub optimal opacification of the lobar and segmental pulmonary arteries. There is preferential contrast opacification of the pulmonary veins, left atrium and thoracic aorta. The main pulmonary artery  appears patent. Within this limitation no definite evidence for lobar or segmental pulmonary artery filling defects identified. Mediastinum/Nodes: The trachea appears patent and is midline. Normal appearance of the esophagus. No mediastinal or hilar adenopathy. Lungs/Pleura: Small left pleural effusion identified. Overlying subsegmental atelectasis and ground-glass attenuation is identified. There are 2 nodules within the left lower lobe, image 52 of series 6. The larger measures 6 mm. Right middle lobe pulmonary nodule measures 3 mm, image 42 of series 6. Upper Abdomen: Hepatic steatosis identified. No acute abnormality noted within the upper abdomen. Indeterminate left adrenal nodule measures 1.9 cm and 21 HU. 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 No aggressive lytic or sclerotic bone lesions. Review of the MIP images confirms the above findings. IMPRESSION: 1. Exam detail diminished due to suboptimal pulmonary arterial opacification with preferential opacification of the pulmonary veins, left atrium and aorta. Within this limitation there is no evidence for central obstructing pulmonary embolus. If there is a high clinical concern for small peripheral pulmonary embolus exam may be repeated. 2. Left pleural effusion with overlying atelectasis and ground-glass attenuation. Findings may reflect resolving infection. 3. Left lower lobe pulmonary nodules. Non-contrast chest CT at 3-6 months is recommended. If the nodules are stable at time of repeat CT, then future CT at 18-24 months (from today's scan) is considered optional for low-risk patients, but is recommended for high-risk patients. This recommendation follows the consensus statement: Guidelines for Management of Incidental Pulmonary Nodules Detected on CT Images: From the Fleischner Society 2017; Radiology 2017; 284:228-243. 4. Hepatic steatosis. Electronically Signed   By: Signa Kell M.D.   On: 04/12/2016 12:24   US  Venous Img Lower Bilateral  Result Date: 04/12/2016 CLINICAL DATA:  Shortness of breath and bilateral lower extremity pain. EXAM: BILATERAL LOWER EXTREMITY VENOUS DOPPLER ULTRASOUND TECHNIQUE: Gray-scale sonography with graded compression, as well as color Doppler and duplex ultrasound were performed to evaluate the lower extremity deep venous systems from the level of the common femoral vein and including the common femoral, femoral, profunda femoral, popliteal and calf veins including the posterior tibial, peroneal and gastrocnemius veins when visible. The superficial great saphenous vein was also interrogated. Spectral Doppler was utilized to evaluate flow at rest and with distal augmentation maneuvers in the common femoral, femoral and popliteal veins. COMPARISON:  None. FINDINGS: RIGHT LOWER EXTREMITY Common Femoral Vein: No evidence of thrombus. Normal compressibility, respiratory phasicity and response to augmentation. Saphenofemoral Junction: No evidence  of thrombus. Normal compressibility and flow on color Doppler imaging. Profunda Femoral Vein: No evidence of thrombus. Normal compressibility and flow on color Doppler imaging. Femoral Vein: No evidence of thrombus. Normal compressibility, respiratory phasicity and response to augmentation. Popliteal Vein: No evidence of thrombus. Normal compressibility, respiratory phasicity and response to augmentation. Calf Veins: No evidence of thrombus. Normal compressibility and flow on color Doppler imaging. LEFT LOWER EXTREMITY Common Femoral Vein: No evidence of thrombus. Normal compressibility, respiratory phasicity and response to augmentation. Saphenofemoral Junction: No evidence of thrombus. Normal compressibility and flow on color Doppler imaging. Profunda Femoral Vein: No evidence of thrombus. Normal compressibility and flow on color Doppler imaging. Femoral Vein: No evidence of thrombus. Normal compressibility, respiratory phasicity and response to  augmentation. Popliteal Vein: No evidence of thrombus. Normal compressibility, respiratory phasicity and response to augmentation. Calf Veins: No evidence of thrombus. Normal compressibility and flow on color Doppler imaging. IMPRESSION: No evidence of deep venous thrombosis. Electronically Signed   By: Richarda Overlie M.D.   On: 04/12/2016 14:04    Procedures Procedures (including critical care time)  Medications Ordered in ED Medications  iopamidol (ISOVUE-370) 76 % injection 100 mL (100 mLs Intravenous Contrast Given 04/12/16 1150)  enoxaparin (LOVENOX) injection 110 mg (110 mg Subcutaneous Given 04/12/16 1355)     Initial Impression / Assessment and Plan / ED Course  I have reviewed the triage vital signs and the nursing notes.  Pertinent labs & imaging results that were available during my care of the patient were reviewed by me and considered in my medical decision making (see chart for details).     Suboptimal bolus timing on her CT imaging.  I don't think we've completely ruled out pulmonary embolism at this point.  Her venous duplex is negative for DVT.  She continues to feel short of breath when she walks short distances.  Her d-dimer is elevated.  No clear etiology of pneumonia as this appears to be improving.  I think the patient will also benefit from echocardiogram and probable repeat CT imaging in the morning.  I do not think she needs a second full dye load on her kidneys at this time.  Bolus will be given  [facility] to admit  Final Clinical Impressions(s) / ED Diagnoses   Final diagnoses:  Shortness of breath  Positive D dimer    New Prescriptions New Prescriptions   No medications on file   I personally performed the services described in this documentation, which was scribed in my presence. The recorded information has been reviewed and is accurate.        Azalia Bilis, MD 04/12/16 1411    Azalia Bilis, MD 04/12/16 [phone]

## 2016-04-12 NOTE — H&P (Addendum)
Patient Demographics:    Jenna Love, is a 56 y.o. female  MRN: [id number]   DOB - [birth year]  Admit Date - 04/12/2016  Outpatient Primary MD for the patient is Milinda Antis, MD   Assessment & Plan:    Principal Problem:   SOB (shortness of breath) Active Problems:   Pulmonary nodule, left   GAD (generalized anxiety disorder)   MDD (major depressive disorder) (HCC)   SOB- Completed the treatment for LLL pneumonia, diagnosed 03/24/16, with symptoms and chest radiograph with Levaquin, azithromycin and amoxicillin. D-dimer mildly elevated at 1, CT angiogram chest- sub-optimal exam, due to inadequate pulmonary artery opacification, but repeat imaging if concern for small peripheral pulmonary embolus.  - Admit to telemetry, in-patient - ABG- pt declined. - Ambulatory O2 sats - Due to symptoms-SOB with exertion, mild d-dimer elevation, no other diagnosis more likely at this time and inadequate CT exam, will continue therapeutic anticoagulation with Lovenox by pharmacy 1 mg/kg every 12 hourly. - Called radiology unsuccessfully several times, [phone], to ask about VQ and recommendations on how soon CTA can be done.  - Order VQ for tomorrow - IVF N/s at 75cc/hr for 24 hrs, help with contrast load excretion, in case of repeat CTA. - troponin <0.03,  trend X2 - EKG morning - Echo- check for RV strain, pulmonary hypertension, cardiomyopathy-?viral. - Patient has a pulmonary function test done in 2015- with reduced FVC- 56 and FEV1- 65, but increased FEV1/FVC- 115, DLCO and Post-bronchodil could not bee done due to pt factors. Overall report suggested Severe restriction. - If VQ and ECHO unrevealing consider HR CT scan, repeat PFTs- full with DLCO, and evaluate for ILD, other causes of restrictive dx. - [place]-  dextrometophan - bronchodils- Albuterol PRN  Pulmonary Nodules- never smoker both has had at least 40 years exposure to secondhand smoke- father and first husband. - Recommendation, to repeat CT chest in 3-6 months.  Right knee pain- of 1 day duration, Minimal warmth compared to left, no redness, no fever, no WBC, doubt infection,  - Knee Xray.   GAD and depression on chronic QHS 1mg  clonazepam, and zoloft - Cont home meds   With History of - Reviewed by me  Past Medical History:  Diagnosis Date  . Anxiety   . Panic attacks   . Pneumonia   . Thyroid nodule 2013   Benign biopsy- ENT Dr. Pollyann Kennedy      Past Surgical History:  Procedure Laterality Date  . CESAREAN SECTION  1992  . COLONOSCOPY WITH PROPOFOL N/A 11/05/2014   Procedure: COLONOSCOPY WITH PROPOFOL;  Surgeon: Corbin Ade, MD;  Location: [facility];  Service: Endoscopy;  Laterality: N/A;  in cecum at 0820, 18 minutes total withdrawel time  . HYSTEROSCOPY W/D&C N/A 10/03/2013   Procedure: DILATATION AND CURETTAGE /HYSTEROSCOPY;  Surgeon: Tilda Burrow, MD;  Location: [facility];  Service: Gynecology;  Laterality: N/A;  . POLYPECTOMY N/A 10/03/2013   Procedure: REMOVAL  OF ENDOMETRIAL POLYP;  Surgeon: Tilda Burrow, MD;  Location: [facility];  Service: Gynecology;  Laterality: N/A;  . POLYPECTOMY N/A 11/05/2014   Procedure: POLYPECTOMY;  Surgeon: Corbin Ade, MD;  Location: [facility];  Service: Endoscopy;  Laterality: N/A;      Chief Complaint  Patient presents with  . Pneumonia      HPI:    Jenna Love  is a 55 y.o. female, with PMH of GAD, MDD, morbid obesity, presented with complaints of cough previously productive, now dry for about 2 months duration, SOB with exertion- developed over the past few weeks and dry productive cough of 1 month duration, chest pain of one-day duration. Chest pain was initially on the left side of her chest, but is now on the right side of her back Patient presented to the ED 03/24/16 was  diagnosed with a LLL pneumonia seen on chest x-ray, was prescribed a course of amoxicillin and azithromycin, on prednisone, which she was compliant with. SHe presented to the ED again (03/27/2016, told to continue the same treatment thus far chest x-ray showed some improvement, so PCP 04/04/2016, with same persistent symptoms, was prescribed a course of Levaquin, last dose 04/09/16. Initially documented she had a fever of 101 but that has since resolved. No diarrhea, vomiting, has some knee pain without swelling or redness, no pedal edema, no hx of VTE in pt of family, no hx of premature CAD. Father died of lung ca at 57. Flu Panel checked 03/24/2016 was negative.   In the ED- chest radiograph- Significantly improved left basilar opacity suggesting resolving atelectasis or pneumonia, CT angiogram-sub-optimal due to inadequate opacification of the pulmonary arteries, Left pleural effusion, left lower lobe pulmonary nodule. O2 sat 96% on room air. BNP- 16.   At least 40 years exposure to heavy second hand smoke- father and first husband, occasional alcohol use, and no illicit drug use.    Review of systems:    In addition to the HPI above,   A full 12 point Review of 10 Systems was done, except as stated above, all other Review of 10 Systems were negative.    Social History:  Reviewed by me    Social History  Substance Use Topics  . Smoking status: Never Smoker  . Smokeless tobacco: Never Used  . Alcohol use No      Family History :  Reviewed by me    Family History  Problem Relation Age of Onset  . Diabetes Mother   . Alzheimer's disease Mother   . Cancer Father     lung   . Colon cancer Neg Hx      Home Medications:   Prior to Admission medications   Medication Sig Start Date End Date Taking? Authorizing Provider  clonazePAM (KLONOPIN) 1 MG tablet TAKE (1) TABLET BY MOUTH THREE TIMES DAILY AS NEEDED. Patient taking differently: Take 1 mg by mouth 3 (three) times daily as  needed for anxiety. TAKE (1) TABLET BY MOUTH THREE TIMES DAILY AS NEEDED. 02/07/16  Yes Salley Scarlet, MD  Nutritional Supplements (JUICE PLUS FIBRE PO) Take 1 tablet by mouth daily.   Yes Historical Provider, MD  ondansetron (ZOFRAN) 4 MG tablet Take 1 tablet (4 mg total) by mouth every 8 (eight) hours as needed for nausea or vomiting. 03/24/16  Yes Marily Memos, MD  sertraline (ZOLOFT) 50 MG tablet Take 1 tablet (50 mg total) by mouth daily. 03/29/16  Yes Salley Scarlet, MD  zolpidem [facility]) 10  MG tablet Take 1 tablet (10 mg total) by mouth at bedtime as needed for sleep. 01/26/16  Yes Salley Scarlet, MD  chlorpheniramine-HYDROcodone [facility]) 10-8 MG/5ML SUER Take 5 mLs by mouth every 12 (twelve) hours as needed. Patient not taking: Reported on 04/12/2016 04/04/16   Salley Scarlet, MD  levofloxacin (LEVAQUIN) 500 MG tablet Take 1 tablet (500 mg total) by mouth daily. Patient not taking: Reported on 04/12/2016 04/04/16   Salley Scarlet, MD     Allergies:    No Known Allergies   Physical Exam:   Vitals  Blood pressure 133/80, pulse 109, temperature 97.9 F (36.6 C), temperature source Oral, resp. rate 19, height 5\' 3"  (1.6 m), weight 108.9 kg (240 lb), last menstrual period 05/25/2014, SpO2 98 %.  Physical Examination: General appearance - alert, well appearing, and in no distress and co-operative Mental status - alert, oriented to person, place, and time Eyes - sclera anicteric Neck - supple, no JVD elevation Chest - due to body habitus, hard to appreciate breath sounds, but clear  to auscultation bilaterally, symmetrical air movement, reduced at the bases, no wheeze. Heart - S1 and S2 normal,  Abdomen - soft, nontender, nondistended, full, no masses or organomegaly Neurological - screening mental status exam normal, neck supple without rigidity, cranial nerves II through XII intact, DTR's normal and symmetric Extremities - no pedal edema noted, intact peripheral  pulses, right knee- warm, but no redness or swelling, normal range of motion. Skin - warm, dry    Data Review:    CBC  Recent Labs Lab 04/12/16 1021  WBC 6.2  HGB 13.1  HCT 39.2  PLT 262  MCV 88.9  MCH 29.7  MCHC 33.4  RDW 13.3  LYMPHSABS 2.4  MONOABS 0.7  EOSABS 0.3  BASOSABS 0.0   ------------------------------------------------------------------------------------------------------------------  Chemistries   Recent Labs Lab 04/12/16 1021  NA 137  K 3.6  CL 103  CO2 25  GLUCOSE 98  BUN 14  CREATININE 0.78  CALCIUM 9.1  AST 24  ALT 23  ALKPHOS 23*  BILITOT 0.8   ------------------------------------------------------------------------------------------------------------------ estimated creatinine clearance is 96.3 mL/min (by C-G formula based on SCr of 0.78 mg/dL). ------------------------------------------------------------------------------------------------------------------ No results for input(s): TSH, T4TOTAL, T3FREE, THYROIDAB in the last 72 hours.  Invalid input(s): FREET3   Coagulation profile No results for input(s): INR, PROTIME in the last 168 hours. -------------------------------------------------------------------------------------------------------------------  Recent Labs  04/12/16 1021  DDIMER 1.00*   -------------------------------------------------------------------------------------------------------------------  Cardiac Enzymes  Recent Labs Lab 04/12/16 1021  TROPONINI <0.03   ------------------------------------------------------------------------------------------------------------------    Component Value Date/Time   BNP 16.0 04/12/2016 1022   BNP 2.3 05/06/2014 1113     ---------------------------------------------------------------------------------------------------------------  Urinalysis    Component Value Date/Time   COLORURINE YELLOW 03/24/2016 0709   APPEARANCEUR HAZY (A) 03/24/2016 0709   LABSPEC  1.012 03/24/2016 0709   PHURINE 6.0 03/24/2016 0709   GLUCOSEU NEGATIVE 03/24/2016 0709   HGBUR NEGATIVE 03/24/2016 0709   BILIRUBINUR NEGATIVE 03/24/2016 0709   KETONESUR NEGATIVE 03/24/2016 0709   PROTEINUR 30 (A) 03/24/2016 0709   UROBILINOGEN 0.2 05/03/2014 1230   NITRITE NEGATIVE 03/24/2016 0709   LEUKOCYTESUR NEGATIVE 03/24/2016 0709   EKG-  T Wave abnormality III, V2, present in old compared to 03/24/2016. ----------------------------------------------------------------------------------------------------------------   Imaging Results:    Dg Chest 2 View  Result Date: 04/12/2016 CLINICAL DATA:  Cough, shortness of breath. EXAM: CHEST  2 VIEW COMPARISON:  Radiographs of March 27, 2016. FINDINGS: The heart size and mediastinal  contours are within normal limits. No pneumothorax or pleural effusion is noted. Right lung is clear. Mild left basilar opacity is noted which is significantly improved compared to prior exam. The visualized skeletal structures are unremarkable. IMPRESSION: Significantly improved left basilar opacity suggesting resolving atelectasis or pneumonia. Continued radiographic follow-up is recommended to ensure resolution. Electronically Signed   By: Lupita RaiderJames  Green Jr, M.D.   On: 04/12/2016 10:53   Ct Angio Chest Pe W And/or Wo Contrast  Result Date: 04/12/2016 CLINICAL DATA:  Recent pneumonia. Chest pain and shortness of breath. EXAM: CT ANGIOGRAPHY CHEST WITH CONTRAST TECHNIQUE: Multidetector CT imaging of the chest was performed using the standard protocol during bolus administration of intravenous contrast. Multiplanar CT image reconstructions and MIPs were obtained to evaluate the vascular anatomy. CONTRAST:  100 cc of Isovue 370 COMPARISON:  None FINDINGS: Cardiovascular: The heart size is normal. No pericardial effusion. Mild aortic atherosclerosis. Sub optimal opacification of the lobar and segmental pulmonary arteries. There is preferential contrast opacification of  the pulmonary veins, left atrium and thoracic aorta. The main pulmonary artery appears patent. Within this limitation no definite evidence for lobar or segmental pulmonary artery filling defects identified. Mediastinum/Nodes: The trachea appears patent and is midline. Normal appearance of the esophagus. No mediastinal or hilar adenopathy. Lungs/Pleura: Small left pleural effusion identified. Overlying subsegmental atelectasis and ground-glass attenuation is identified. There are 2 nodules within the left lower lobe, image 52 of series 6. The larger measures 6 mm. Right middle lobe pulmonary nodule measures 3 mm, image 42 of series 6. Upper Abdomen: Hepatic steatosis identified. No acute abnormality noted within the upper abdomen. Indeterminate left adrenal nodule measures 1.9 cm and 21 HU. 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 No aggressive lytic or sclerotic bone lesions. Review of the MIP images confirms the above findings. IMPRESSION: 1. Exam detail diminished due to suboptimal pulmonary arterial opacification with preferential opacification of the pulmonary veins, left atrium and aorta. Within this limitation there is no evidence for central obstructing pulmonary embolus. If there is a high clinical concern for small peripheral pulmonary embolus exam may be repeated. 2. Left pleural effusion with overlying atelectasis and ground-glass attenuation. Findings may reflect resolving infection. 3. Left lower lobe pulmonary nodules. Non-contrast chest CT at 3-6 months is recommended. If the nodules are stable at time of repeat CT, then future CT at 18-24 months (from today's scan) is considered optional for low-risk patients, but is recommended for high-risk patients. This recommendation follows the consensus statement: Guidelines for Management of Incidental Pulmonary Nodules Detected on CT Images: From the Fleischner Society 2017; Radiology 2017; 284:228-243. 4. Hepatic steatosis.  Electronically Signed   By: Signa Kellaylor  Stroud M.D.   On: 04/12/2016 12:24   [place] Venous Img Lower Bilateral  Result Date: 04/12/2016 CLINICAL DATA:  Shortness of breath and bilateral lower extremity pain. EXAM: BILATERAL LOWER EXTREMITY VENOUS DOPPLER ULTRASOUND TECHNIQUE: Gray-scale sonography with graded compression, as well as color Doppler and duplex ultrasound were performed to evaluate the lower extremity deep venous systems from the level of the common femoral vein and including the common femoral, femoral, profunda femoral, popliteal and calf veins including the posterior tibial, peroneal and gastrocnemius veins when visible. The superficial great saphenous vein was also interrogated. Spectral Doppler was utilized to evaluate flow at rest and with distal augmentation maneuvers in the common femoral, femoral and popliteal veins. COMPARISON:  None. FINDINGS: RIGHT LOWER EXTREMITY Common Femoral Vein: No evidence of thrombus. Normal compressibility, respiratory phasicity and response to  augmentation. Saphenofemoral Junction: No evidence of thrombus. Normal compressibility and flow on color Doppler imaging. Profunda Femoral Vein: No evidence of thrombus. Normal compressibility and flow on color Doppler imaging. Femoral Vein: No evidence of thrombus. Normal compressibility, respiratory phasicity and response to augmentation. Popliteal Vein: No evidence of thrombus. Normal compressibility, respiratory phasicity and response to augmentation. Calf Veins: No evidence of thrombus. Normal compressibility and flow on color Doppler imaging. LEFT LOWER EXTREMITY Common Femoral Vein: No evidence of thrombus. Normal compressibility, respiratory phasicity and response to augmentation. Saphenofemoral Junction: No evidence of thrombus. Normal compressibility and flow on color Doppler imaging. Profunda Femoral Vein: No evidence of thrombus. Normal compressibility and flow on color Doppler imaging. Femoral Vein: No evidence of  thrombus. Normal compressibility, respiratory phasicity and response to augmentation. Popliteal Vein: No evidence of thrombus. Normal compressibility, respiratory phasicity and response to augmentation. Calf Veins: No evidence of thrombus. Normal compressibility and flow on color Doppler imaging. IMPRESSION: No evidence of deep venous thrombosis. Electronically Signed   By: Richarda Overlie M.D.   On: 04/12/2016 14:04    Radiological Exams on Admission: Dg Chest 2 View  Result Date: 04/12/2016 CLINICAL DATA:  Cough, shortness of breath. EXAM: CHEST  2 VIEW COMPARISON:  Radiographs of March 27, 2016. FINDINGS: The heart size and mediastinal contours are within normal limits. No pneumothorax or pleural effusion is noted. Right lung is clear. Mild left basilar opacity is noted which is significantly improved compared to prior exam. The visualized skeletal structures are unremarkable. IMPRESSION: Significantly improved left basilar opacity suggesting resolving atelectasis or pneumonia. Continued radiographic follow-up is recommended to ensure resolution. Electronically Signed   By: Lupita Raider, M.D.   On: 04/12/2016 10:53   Ct Angio Chest Pe W And/or Wo Contrast  Result Date: 04/12/2016 CLINICAL DATA:  Recent pneumonia. Chest pain and shortness of breath. EXAM: CT ANGIOGRAPHY CHEST WITH CONTRAST TECHNIQUE: Multidetector CT imaging of the chest was performed using the standard protocol during bolus administration of intravenous contrast. Multiplanar CT image reconstructions and MIPs were obtained to evaluate the vascular anatomy. CONTRAST:  100 cc of Isovue 370 COMPARISON:  None FINDINGS: Cardiovascular: The heart size is normal. No pericardial effusion. Mild aortic atherosclerosis. Sub optimal opacification of the lobar and segmental pulmonary arteries. There is preferential contrast opacification of the pulmonary veins, left atrium and thoracic aorta. The main pulmonary artery appears patent. Within this  limitation no definite evidence for lobar or segmental pulmonary artery filling defects identified. Mediastinum/Nodes: The trachea appears patent and is midline. Normal appearance of the esophagus. No mediastinal or hilar adenopathy. Lungs/Pleura: Small left pleural effusion identified. Overlying subsegmental atelectasis and ground-glass attenuation is identified. There are 2 nodules within the left lower lobe, image 52 of series 6. The larger measures 6 mm. Right middle lobe pulmonary nodule measures 3 mm, image 42 of series 6. Upper Abdomen: Hepatic steatosis identified. No acute abnormality noted within the upper abdomen. Indeterminate left adrenal nodule measures 1.9 cm and 21 HU. 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 No aggressive lytic or sclerotic bone lesions. Review of the MIP images confirms the above findings. IMPRESSION: 1. Exam detail diminished due to suboptimal pulmonary arterial opacification with preferential opacification of the pulmonary veins, left atrium and aorta. Within this limitation there is no evidence for central obstructing pulmonary embolus. If there is a high clinical concern for small peripheral pulmonary embolus exam may be repeated. 2. Left pleural effusion with overlying atelectasis and ground-glass attenuation. Findings may reflect resolving  infection. 3. Left lower lobe pulmonary nodules. Non-contrast chest CT at 3-6 months is recommended. If the nodules are stable at time of repeat CT, then future CT at 18-24 months (from today's scan) is considered optional for low-risk patients, but is recommended for high-risk patients. This recommendation follows the consensus statement: Guidelines for Management of Incidental Pulmonary Nodules Detected on CT Images: From the Fleischner Society 2017; Radiology 2017; 284:228-243. 4. Hepatic steatosis. Electronically Signed   By: Signa Kell M.D.   On: 04/12/2016 12:24   US Venous Img Lower  Bilateral  Result Date: 04/12/2016 CLINICAL DATA:  Shortness of breath and bilateral lower extremity pain. EXAM: BILATERAL LOWER EXTREMITY VENOUS DOPPLER ULTRASOUND TECHNIQUE: Gray-scale sonography with graded compression, as well as color Doppler and duplex ultrasound were performed to evaluate the lower extremity deep venous systems from the level of the common femoral vein and including the common femoral, femoral, profunda femoral, popliteal and calf veins including the posterior tibial, peroneal and gastrocnemius veins when visible. The superficial great saphenous vein was also interrogated. Spectral Doppler was utilized to evaluate flow at rest and with distal augmentation maneuvers in the common femoral, femoral and popliteal veins. COMPARISON:  None. FINDINGS: RIGHT LOWER EXTREMITY Common Femoral Vein: No evidence of thrombus. Normal compressibility, respiratory phasicity and response to augmentation. Saphenofemoral Junction: No evidence of thrombus. Normal compressibility and flow on color Doppler imaging. Profunda Femoral Vein: No evidence of thrombus. Normal compressibility and flow on color Doppler imaging. Femoral Vein: No evidence of thrombus. Normal compressibility, respiratory phasicity and response to augmentation. Popliteal Vein: No evidence of thrombus. Normal compressibility, respiratory phasicity and response to augmentation. Calf Veins: No evidence of thrombus. Normal compressibility and flow on color Doppler imaging. LEFT LOWER EXTREMITY Common Femoral Vein: No evidence of thrombus. Normal compressibility, respiratory phasicity and response to augmentation. Saphenofemoral Junction: No evidence of thrombus. Normal compressibility and flow on color Doppler imaging. Profunda Femoral Vein: No evidence of thrombus. Normal compressibility and flow on color Doppler imaging. Femoral Vein: No evidence of thrombus. Normal compressibility, respiratory phasicity and response to augmentation. Popliteal  Vein: No evidence of thrombus. Normal compressibility, respiratory phasicity and response to augmentation. Calf Veins: No evidence of thrombus. Normal compressibility and flow on color Doppler imaging. IMPRESSION: No evidence of deep venous thrombosis. Electronically Signed   By: Richarda Overlie M.D.   On: 04/12/2016 14:04    DVT Prophylaxis -Lovenox therapeutic. AM Labs Ordered, also please review Full Orders  Family Communication: Admission, patients condition and plan of care including tests being ordered have been discussed with the patient and spouse who indicate understanding and agree with the plan   Code Status - Full Code  Likely DC to  Home  Condition   Stable  Onnie Boer M.D on 04/12/2016 at 3:58 PM  Between 3pm to 12 midnight - Pager - [phone]  After 7pm go to [email or web address] - password [email or web address]  [facility] - Office  [phone]  Dragon dictation system was used to create this note, attempts have been made to correct errors, however presence of uncorrected errors is not a reflection quality of care provided.

## 2016-04-12 NOTE — Progress Notes (Signed)
Walked patient while checking pulse ox. Patient walked over 100 feet without oxygen while maintaining O2 sats 93 and above.

## 2016-04-12 NOTE — ED Notes (Signed)
Pt returned from xray

## 2016-04-13 ENCOUNTER — Encounter (HOSPITAL_COMMUNITY): Payer: Self-pay

## 2016-04-13 ENCOUNTER — Inpatient Hospital Stay (HOSPITAL_COMMUNITY): Payer: PRIVATE HEALTH INSURANCE

## 2016-04-13 DIAGNOSIS — R0600 Dyspnea, unspecified: Secondary | ICD-10-CM

## 2016-04-13 DIAGNOSIS — R0602 Shortness of breath: Principal | ICD-10-CM

## 2016-04-13 LAB — INFLUENZA PANEL BY PCR (TYPE A & B)
Influenza A By PCR: NEGATIVE
Influenza B By PCR: NEGATIVE

## 2016-04-13 LAB — BASIC METABOLIC PANEL WITH GFR
Anion gap: 7 (ref 5–15)
BUN: 12 mg/dL (ref 6–20)
CO2: 27 mmol/L (ref 22–32)
Calcium: 8.6 mg/dL — ABNORMAL LOW (ref 8.9–10.3)
Chloride: 105 mmol/L (ref 101–111)
Creatinine, Ser: 0.83 mg/dL (ref 0.44–1.00)
GFR calc Af Amer: >60 mL/min
GFR calc non Af Amer: >60 mL/min
Glucose, Bld: 103 mg/dL — ABNORMAL HIGH (ref 65–99)
Potassium: 3.8 mmol/L (ref 3.5–5.1)
Sodium: 139 mmol/L (ref 135–145)

## 2016-04-13 LAB — ECHOCARDIOGRAM COMPLETE
Height: 63 in
WEIGHTICAEL: 3840 [oz_av]

## 2016-04-13 MED ORDER — TECHNETIUM TC 99M DIETHYLENETRIAME-PENTAACETIC ACID
30.0000 | Freq: Once | INTRAVENOUS | Status: AC | PRN
  Administered 2016-04-13: 33 via RESPIRATORY_TRACT

## 2016-04-13 MED ORDER — PERFLUTREN LIPID MICROSPHERE
1.0000 mL | INTRAVENOUS | Status: AC | PRN
  Administered 2016-04-13: 1 mL via INTRAVENOUS
  Filled 2016-04-13: qty 10

## 2016-04-13 MED ORDER — TECHNETIUM TO 99M ALBUMIN AGGREGATED
4.0000 | Freq: Once | INTRAVENOUS | Status: AC | PRN
  Administered 2016-04-13: 4.2 via INTRAVENOUS

## 2016-04-13 NOTE — Progress Notes (Signed)
Patient discharged home Iv removed and site intact. Sent home with personal belongings and discharge papers.

## 2016-04-13 NOTE — Progress Notes (Signed)
*  PRELIMINARY RESULTS* Echocardiogram 2D Echocardiogram with definity has been performed.  [name], Nekia Maxham 04/13/2016, 3:31 PM
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
## 2016-04-18 ENCOUNTER — Encounter: Payer: Self-pay | Admitting: Family Medicine

## 2016-04-20 ENCOUNTER — Other Ambulatory Visit: Payer: Self-pay | Admitting: Family Medicine

## 2016-04-20 NOTE — Telephone Encounter (Signed)
Ok to refill??  Last office visit 04/04/2016.  Last refill 01/26/2016, #3 refills.

## 2016-04-21 NOTE — Telephone Encounter (Signed)
okay

## 2016-04-21 NOTE — Telephone Encounter (Signed)
Medication called to pharmacy. 

## 2016-05-17 ENCOUNTER — Encounter: Payer: Self-pay | Admitting: Family Medicine

## 2016-05-17 MED ORDER — ZOLPIDEM TARTRATE 10 MG PO TABS: 10.0000 mg | ORAL_TABLET | Freq: Every evening | ORAL | 3 refills | Status: DC | PRN

## 2016-05-19 ENCOUNTER — Encounter: Payer: Self-pay | Admitting: Family Medicine

## 2016-05-19 ENCOUNTER — Ambulatory Visit (INDEPENDENT_AMBULATORY_CARE_PROVIDER_SITE_OTHER): Payer: PRIVATE HEALTH INSURANCE | Admitting: Family Medicine

## 2016-05-19 VITALS — BP 128/62 | HR 90 | Temp 98.4°F | Resp 18 | Ht 63.0 in | Wt 269.0 lb

## 2016-05-19 DIAGNOSIS — B9789 Other viral agents as the cause of diseases classified elsewhere: Secondary | ICD-10-CM | POA: Diagnosis not present

## 2016-05-19 DIAGNOSIS — J069 Acute upper respiratory infection, unspecified: Secondary | ICD-10-CM | POA: Diagnosis not present

## 2016-05-19 MED ORDER — FLUTICASONE PROPIONATE 50 MCG/ACT NA SUSP: 2.0000 | Freq: Every day | NASAL | 6 refills | Status: DC

## 2016-05-19 MED ORDER — LEVOFLOXACIN 500 MG PO TABS: 500.0000 mg | ORAL_TABLET | Freq: Every day | ORAL | 0 refills | Status: DC

## 2016-05-19 MED ORDER — HYDROCOD POLST-CPM POLST ER 10-8 MG/5ML PO SUER: 5.0000 mL | Freq: Two times a day (BID) | ORAL | 0 refills | Status: DC | PRN

## 2016-05-19 NOTE — Progress Notes (Signed)
   Subjective:    Patient ID: Jenna Love, female    DOB: Apr 20, 1960, 56 y.o.   MRN: [id number]  Patient presents for Illness (x2 days- productive cough with yellow colored mucus, sinus pressure, chest congestin, post nasal drip, sore throat)  Patient here with cough and congestion for 2 days . She is treated for community-acquired pneumonia in the left side of her lung back in January of this did take quite some time to clear up. Past few days has had cough with small sputum production, mostly ear pressure, runny nose. Has not used robitussin   Review Of Systems:  GEN- denies fatigue, fever, weight loss,weakness, recent illness HEENT- denies eye drainage, change in vision, +nasal discharge, CVS- denies chest pain, palpitations RESP- denies SOB, +cough, wheeze ABD- denies N/V, change in stools, abd pain GU- denies dysuria, hematuria, dribbling, incontinence MSK- denies joint pain, muscle aches, injury Neuro- denies headache, dizziness, syncope, seizure activity       Objective:    BP 128/62   Pulse 90   Temp 98.4 F (36.9 C) (Oral)   Resp 18   Ht 5\' 3"  (1.6 m)   Wt 269 lb (122 kg)   LMP 05/25/2014   SpO2 98%   BMI 47.65 kg/m  GEN- NAD, alert and oriented x3 HEENT- PERRL, EOMI, non injected sclera, pink conjunctiva, MMM, oropharynx clear, nares clear rhinorhea enlarged turbinates bilat  Neck- Supple, no LAD  CVS- RRR, no murmur RESP-CTAB EXT- No edema Pulses- Radial 2+        Assessment & Plan:      Problem List Items Addressed This Visit    None    Visit Diagnoses    Viral URI    -  Primary   Viral at this time, pt very anxious as she had PNA recently, No fever, normal chest exam. Given Xyzal, flonase, tussionex. If she decompensates, start levaquin,obtain CXR which she already has orders for  Non smoker, with regards to lung nodules plan for 6 month repeat scan      Note: This dictation was prepared with Dragon dictation along with smaller phrase  technology. Any transcriptional errors that result from this process are unintentional.

## 2016-05-19 NOTE — Patient Instructions (Addendum)
Get the chest xray if not improved F/U as needed

## 2016-05-27 ENCOUNTER — Encounter: Payer: Self-pay | Admitting: Family Medicine

## 2016-05-29 MED ORDER — CLONAZEPAM 1 MG PO TABS: ORAL_TABLET | ORAL | 3 refills | Status: DC

## 2016-06-20 ENCOUNTER — Encounter: Payer: Self-pay | Admitting: Family Medicine

## 2016-06-27 ENCOUNTER — Other Ambulatory Visit: Payer: Self-pay | Admitting: Family Medicine

## 2016-08-21 ENCOUNTER — Other Ambulatory Visit: Payer: Self-pay | Admitting: Family Medicine

## 2016-08-21 DIAGNOSIS — Z1231 Encounter for screening mammogram for malignant neoplasm of breast: Secondary | ICD-10-CM

## 2016-08-23 ENCOUNTER — Ambulatory Visit (HOSPITAL_COMMUNITY)
Admission: RE | Admit: 2016-08-23 | Discharge: 2016-08-23 | Disposition: A | Discharge status: Home / Self Care | Payer: PRIVATE HEALTH INSURANCE | Source: Ambulatory Visit | Attending: Family Medicine | Admitting: Family Medicine

## 2016-08-23 ENCOUNTER — Other Ambulatory Visit: Payer: Self-pay | Admitting: Family Medicine

## 2016-08-23 DIAGNOSIS — Z1231 Encounter for screening mammogram for malignant neoplasm of breast: Secondary | ICD-10-CM | POA: Diagnosis not present

## 2016-08-29 ENCOUNTER — Encounter: Payer: Self-pay | Admitting: Family Medicine

## 2016-08-29 ENCOUNTER — Ambulatory Visit (INDEPENDENT_AMBULATORY_CARE_PROVIDER_SITE_OTHER): Payer: PRIVATE HEALTH INSURANCE | Admitting: Family Medicine

## 2016-08-29 VITALS — BP 136/68 | HR 78 | Temp 98.4°F | Resp 14 | Ht 63.0 in | Wt 274.0 lb

## 2016-08-29 DIAGNOSIS — R609 Edema, unspecified: Secondary | ICD-10-CM

## 2016-08-29 DIAGNOSIS — R7303 Prediabetes: Secondary | ICD-10-CM | POA: Diagnosis not present

## 2016-08-29 DIAGNOSIS — F411 Generalized anxiety disorder: Secondary | ICD-10-CM

## 2016-08-29 DIAGNOSIS — F331 Major depressive disorder, recurrent, moderate: Secondary | ICD-10-CM

## 2016-08-29 MED ORDER — SERTRALINE HCL 100 MG PO TABS: 100.0000 mg | ORAL_TABLET | Freq: Every day | ORAL | 6 refills | Status: DC

## 2016-08-29 MED ORDER — FUROSEMIDE 20 MG PO TABS: 20.0000 mg | ORAL_TABLET | Freq: Every day | ORAL | 6 refills | Status: DC

## 2016-08-29 NOTE — Assessment & Plan Note (Signed)
This history of heart failure. I think this is due to her being up on her feet some leaky veins due to her morbid obesity. Start Lasix 20 mg to take daily Also discussed the importance of low-salt diet avoiding fried foods or heavy snacking. This is all related to her depression.

## 2016-08-29 NOTE — Progress Notes (Signed)
Subjective:    Patient ID: Jenna Love, female    DOB: [birth year], 56 y.o.   MRN: [id number]  Patient presents for Edema (x1 week- states that she has swelling to hands and legs around 5pm each day that resolves overnight)   Pt here with leg swelling, history of periphreal edema. Seem by cardiology had ECHO THIS YEAR, no heart failure. In the past on lasix. He admits to emotional eating she broke down in tears since her grandsons help to a 17 month is declining. He now requires a heart and lung transplant they found out about this on Friday. She's having difficulties with her spouse she is depressed and upset all of the time. She tried Weight Watchers but has not been following the routine. She notes that she just snacks all the time this is makes her feel better. Her weight continues to increase and now she's having more problems with swelling in her hands and her feet she feels tired all the time. She has been taking her Zoloft also uses her Ambien for sleep. She states that her coworkers have given asked her to see a therapist or talk to someone. States for while she was talking to her pastor. Reviewed her labs from work done about 3 weeks ago, had borderline A1C of 5.8%  Review Of Systems:  GEN- + fatigue, denies fever, weight loss,weakness, recent illness HEENT- denies eye drainage, change in vision, nasal discharge, CVS- denies chest pain, palpitations RESP- denies SOB, cough, wheeze ABD- denies N/V, change in stools, abd pain GU- denies dysuria, hematuria, dribbling, incontinence MSK- denies joint pain, muscle aches, injury Neuro- denies headache, dizziness, syncope, seizure activity       Objective:    BP 136/68   Pulse 78   Temp 98.4 F (36.9 C) (Oral)   Resp 14   Ht 5\' 3"  (1.6 m)   Wt 274 lb (124.3 kg)   LMP 05/25/2014   SpO2 97%   BMI 48.54 kg/m  GEN- NAD, alert and oriented x3,obese gained 24lbs poast 6 months  HEENT- PERRL, EOMI, non injected sclera, pink  conjunctiva, MMM, oropharynx clear Neck- Supple, no JVD  CVS- RRR, no murmur RESP-CTAB ABD-NABS,soft,NT,ND Psych- depressed tearful, not anxious, no SI EXT- mild edema to shins, no edema hands  Pulses- Radial, DP- 2+        Assessment & Plan:      Problem List Items Addressed This Visit    Morbid obesity (HCC)   GAD (generalized anxiety disorder)   Relevant Orders   Ambulatory referral to Psychiatry   Peripheral edema - Primary    This history of heart failure. I think this is due to her being up on her feet some leaky veins due to her morbid obesity. Start Lasix 20 mg to take daily Also discussed the importance of low-salt diet avoiding fried foods or heavy snacking. This is all related to her depression.      MDD (major depressive disorder) (HCC)    She's tried multiple medications in the past. Including Paxil, Zoloft, Wellbutrin, Effexor. I may increase her Zoloft which she is currently to 100 mg she agrees to seek therapy. She swears she will be on multiple medications.      Relevant Medications   sertraline (ZOLOFT) 100 MG tablet   Other Relevant Orders   Ambulatory referral to Psychiatry   Borderline diabetes    A1C 5.8% Lipids are normal Weight loss will correct  Note: This dictation was prepared with Dragon dictation along with smaller phrase technology. Any transcriptional errors that result from this process are unintentional.

## 2016-08-29 NOTE — Patient Instructions (Addendum)
[facility]erral to [facility] Phone: [phone] Take the lasix daily for 1 week, then as needed  Increase zoloft to 100mg  once a day  F/U 4 weeks

## 2016-08-29 NOTE — Assessment & Plan Note (Signed)
A1C 5.8% Lipids are normal Weight loss will correct

## 2016-08-29 NOTE — Assessment & Plan Note (Signed)
She's tried multiple medications in the past. Including Paxil, Zoloft, Wellbutrin, Effexor. I may increase her Zoloft which she is currently to 100 mg she agrees to seek therapy. She swears she will be on multiple medications.

## 2016-08-30 ENCOUNTER — Ambulatory Visit: Payer: PRIVATE HEALTH INSURANCE | Admitting: Family Medicine

## 2016-09-07 ENCOUNTER — Encounter: Payer: Self-pay | Admitting: Family Medicine

## 2016-09-21 ENCOUNTER — Encounter: Payer: Self-pay | Admitting: Family Medicine

## 2016-09-21 MED ORDER — CLONAZEPAM 1 MG PO TABS: ORAL_TABLET | ORAL | 3 refills | Status: DC

## 2016-11-02 ENCOUNTER — Other Ambulatory Visit: Payer: Self-pay | Admitting: Family Medicine

## 2016-11-02 NOTE — Telephone Encounter (Signed)
Ok to refill??  Last office visit 08/29/2016.  Last refill 05/17/2016, #3 refills.

## 2016-11-03 NOTE — Telephone Encounter (Signed)
rx called to pharmacy 

## 2016-11-03 NOTE — Telephone Encounter (Signed)
Okay to refill? 

## 2016-12-02 ENCOUNTER — Other Ambulatory Visit: Payer: Self-pay | Admitting: Family Medicine

## 2016-12-03 ENCOUNTER — Encounter: Payer: Self-pay | Admitting: Family Medicine

## 2016-12-04 MED ORDER — ZOLPIDEM TARTRATE 10 MG PO TABS: 10.0000 mg | ORAL_TABLET | Freq: Every evening | ORAL | 3 refills | Status: DC | PRN

## 2016-12-12 ENCOUNTER — Other Ambulatory Visit: Payer: Self-pay | Admitting: Family Medicine

## 2016-12-12 ENCOUNTER — Other Ambulatory Visit: Payer: PRIVATE HEALTH INSURANCE

## 2016-12-12 DIAGNOSIS — F339 Major depressive disorder, recurrent, unspecified: Secondary | ICD-10-CM

## 2016-12-12 DIAGNOSIS — E041 Nontoxic single thyroid nodule: Secondary | ICD-10-CM

## 2016-12-12 DIAGNOSIS — Z79899 Other long term (current) drug therapy: Secondary | ICD-10-CM

## 2016-12-12 DIAGNOSIS — R7303 Prediabetes: Secondary | ICD-10-CM

## 2016-12-15 ENCOUNTER — Encounter: Payer: Self-pay | Admitting: Family Medicine

## 2016-12-15 ENCOUNTER — Ambulatory Visit (INDEPENDENT_AMBULATORY_CARE_PROVIDER_SITE_OTHER): Payer: PRIVATE HEALTH INSURANCE | Admitting: Family Medicine

## 2016-12-15 VITALS — BP 130/60 | HR 94 | Temp 97.9°F | Resp 16 | Ht 63.0 in | Wt 274.0 lb

## 2016-12-15 DIAGNOSIS — R7303 Prediabetes: Secondary | ICD-10-CM

## 2016-12-15 DIAGNOSIS — M25461 Effusion, right knee: Secondary | ICD-10-CM | POA: Diagnosis not present

## 2016-12-15 DIAGNOSIS — Z0000 Encounter for general adult medical examination without abnormal findings: Secondary | ICD-10-CM

## 2016-12-15 DIAGNOSIS — F331 Major depressive disorder, recurrent, moderate: Secondary | ICD-10-CM

## 2016-12-15 DIAGNOSIS — R7301 Impaired fasting glucose: Secondary | ICD-10-CM

## 2016-12-15 DIAGNOSIS — M25561 Pain in right knee: Secondary | ICD-10-CM | POA: Diagnosis not present

## 2016-12-15 MED ORDER — FUROSEMIDE 20 MG PO TABS: 20.0000 mg | ORAL_TABLET | Freq: Every day | ORAL | 6 refills | Status: DC

## 2016-12-15 MED ORDER — BUPROPION HCL ER (XL) 150 MG PO TB24: 150.0000 mg | ORAL_TABLET | Freq: Every day | ORAL | 3 refills | Status: DC

## 2016-12-15 MED ORDER — SERTRALINE HCL 100 MG PO TABS: 100.0000 mg | ORAL_TABLET | Freq: Every day | ORAL | 6 refills | Status: DC

## 2016-12-15 NOTE — Patient Instructions (Signed)
F/U 4 months  
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
## 2016-12-16 LAB — BASIC METABOLIC PANEL
BUN: 19 mg/dL (ref 7–25)
CO2: 24 mmol/L (ref 20–32)
CREATININE: 0.89 mg/dL (ref 0.50–1.05)
Calcium: 8.8 mg/dL (ref 8.6–10.4)
Chloride: 105 mmol/L (ref 98–110)
GLUCOSE: 99 mg/dL (ref 65–99)
Potassium: 4.2 mmol/L (ref 3.5–5.3)
SODIUM: 139 mmol/L (ref 135–146)

## 2016-12-16 LAB — HEMOGLOBIN A1C
EAG (MMOL/L): 6.3 (calc)
Hgb A1c MFr Bld: 5.6 % of total Hgb (ref <5.7)
Mean Plasma Glucose: 114 (calc)

## 2016-12-17 NOTE — Assessment & Plan Note (Signed)
Recheck A1C And renal function
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
## 2016-12-20 ENCOUNTER — Ambulatory Visit (INDEPENDENT_AMBULATORY_CARE_PROVIDER_SITE_OTHER): Payer: PRIVATE HEALTH INSURANCE

## 2016-12-20 ENCOUNTER — Ambulatory Visit (INDEPENDENT_AMBULATORY_CARE_PROVIDER_SITE_OTHER): Payer: PRIVATE HEALTH INSURANCE | Admitting: Orthopedic Surgery

## 2016-12-20 DIAGNOSIS — M1711 Unilateral primary osteoarthritis, right knee: Secondary | ICD-10-CM | POA: Diagnosis not present

## 2016-12-20 DIAGNOSIS — M25561 Pain in right knee: Secondary | ICD-10-CM | POA: Diagnosis not present

## 2016-12-20 DIAGNOSIS — S8000XA Contusion of unspecified knee, initial encounter: Secondary | ICD-10-CM | POA: Diagnosis not present

## 2016-12-20 NOTE — Progress Notes (Signed)
Patient ID: ARNECIA ECTOR, female   DOB: 04/14/1960, 56 y.o.   MRN: [id number]  Chief Complaint  Patient presents with  . 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 She's here because the swelling will go down. She's been able to walk ambulate work without any difficulty. She's always had some trouble with both knees and hips  Currently she has mild pain right knee swelling subcutaneous the pain is mild dull started on the 16th. It does not appear to be affected by exercise and rest and ice seem to help    Review of Systems  Constitutional: Negative for chills and fever.  Cardiovascular: Negative for chest pain.  Musculoskeletal: Positive for joint pain.  Neurological: Negative for tingling and sensory change.    Past Medical History:  Diagnosis Date  . Anxiety   . Panic attacks   . Pneumonia   . Thyroid nodule 2013   Benign biopsy- ENT Dr. Pollyann Kennedy    Past Surgical History:  Procedure Laterality Date  . CESAREAN SECTION  1992  . COLONOSCOPY WITH PROPOFOL N/A 11/05/2014   Procedure: COLONOSCOPY WITH PROPOFOL;  Surgeon: Corbin Ade, MD;  Location: [facility];  Service: Endoscopy;  Laterality: N/A;  in cecum at 0820, 18 minutes total withdrawel time  . HYSTEROSCOPY W/D&C N/A 10/03/2013   Procedure: DILATATION AND CURETTAGE /HYSTEROSCOPY;  Surgeon: Tilda Burrow, MD;  Location: [facility];  Service: Gynecology;  Laterality: N/A;  . POLYPECTOMY N/A 10/03/2013   Procedure: REMOVAL OF ENDOMETRIAL POLYP;  Surgeon: Tilda Burrow, MD;  Location: [facility];  Service: Gynecology;  Laterality: N/A;  . POLYPECTOMY N/A 11/05/2014   Procedure: POLYPECTOMY;  Surgeon: Corbin Ade, MD;  Location: [facility];  Service: Endoscopy;  Laterality: N/A;    PHYSICAL EXAM  LMP 05/25/2014   LMP 05/25/2014  Height 5 foot 3 weight 275 blood pressure  122/77 GENERAL appearance reveals no gross abnormalities, normal development grooming and hygiene   MENTAL STATUS we note that the patient is awake alert and oriented to person place and time MOOD/AFFECT ARE NORMAL   GAIT reveals no limp in the effected limb  Left Knee Exam   Tenderness  None  Range of Motion  Normal left knee ROM  Muscle Strength  Normal left knee strength  Tests  Drawer:       Anterior - Negative     Posterior - Negative  Right Knee Exam   Tenderness  None  Range of Motion  Normal right knee ROM  Muscle Strength  Normal right knee strength  Tests  Drawer:       Anterior - Negative    Posterior - Negative    VASC 2+ dorsalis pedis pulse normal capillary refill excellent warmth to the extremity  NEURO normal sensation and no pathologic reflexes  LYMPH deferred noncontributory   IMAGING STUDIES  I have independently reviewed the x-rays and I interpreted the x-rays as follows:  X-ray shows varus knee with medial compartment joint space narrowing consistent with osteoarthritis moderate to severe  Dx  Subcutaneous hematoma  primary osteoarthritis of the  right knee  PLAN  observation  2:59 PM Fuller Canada, MD 12/20/2016

## 2016-12-20 NOTE — Patient Instructions (Signed)
Knee Osteoarthritis Treatment Decision Aid Introduction If you have osteoarthritis in your knees, you may have several treatment options. This document will review two of these options.  What are my treatment options? ? Surgery ? Non-surgical treatment may include one option or a combination of several of the following options:: Surgery for this condition is total knee replacement. ? Exercise programs and working with a physical therapist.: This is a procedure to replace the knee joint with an artificial (prosthetic) knee joint. ? Devices to help you move around (assistive devices), like a brace, wrap, splint, or wedge insoles.: It replaces parts of the thigh bone (femur), lower leg bone (tibia), and kneecap (patella) that are removed during the procedure. ? Medicines.: ? Shots of medicine to relieve pain and inflammation, such as steroids and hyaluronic acid.: ? Using a small device which delivers mild pulses through to nerve endings to relieve pain (transcutaneous electrical stimulation, TENS).: ? Applying heat and cold to the knee.: ? Massage.: ? Insertion of needles into certain places on the skin to relieve pain (acupuncture).: ? A plan to control your weight or to lose weight, if you are overweight.:  Who is eligible?  Good candidates may include: ? Surgery ? People who have pain that is tolerable.: People who have pain that does not get better with non-surgical treatments and medicines. ? People who are not allergic to medicines that are used.: People who have moderate or severe pain that makes it hard to do physical activities like walking, rising from a chair, or using stairs. ? People who do not have an illness that makes medicines or injections risky.: People who have pain that affects quality of sleep. ? People who are motivated to do exercise programs or lose weight.: People who do not have an illness that makes surgery risky. ? People who do not have an infection, too much  fluid, or skin breakdown in the knee area.: What are the benefits?  Benefits may include: ? Surgery ? Improved mobility and less pain.: Improved mobility and less pain. ? Not needing surgery.: Pain likely being gone within 2 years after surgery. ? Pain relief that lasts for 6-12 months, starting 1-2 days after steroid injections.: ? Pain relief that lasts for several months, starting several weeks after hyaluronic acid injections.: What are the risks?  Risks may include: ? Surgery ? Failure to relieve symptoms.: Failure to relieve symptoms. ? Joint damage that may continue to get worse.: Instability of the knee. ? Medicine or injection side effects.: Damage to blood vessels, nerves, or other structures in the knee. ? Bruising or bleeding with acupuncture therapy.: Bleeding or a blood clot. ? Skin injury due to incorrect application of heat or cold therapy.: Infection. ? Bruising due to massage.: Decreased range of motion of the knee. ? Increased pain due to exercise programs.: Loosening of the prosthetic joint. ? : Needing knee replacement surgery again in 15-20 years to replace the prosthetic joint. What preparation is needed?  Preparation may include: ? Surgery ? Physical evaluation. This may involve lab work and imaging tests, such as X-rays, MRI, CT scan, or bone scans.: Physical evaluation. This may involve lab work and imaging tests, such as X-rays, MRI, CT scan, or bone scans. ? Planning to have someone take you home from the hospital or clinic, if you have knee injections.: Planning to have someone take you home from the hospital. ? : Arranging for someone to help you for a few days after surgery. ? : Working   with specialists, if you have other medical conditions. ? : Preparing your home for your recovery. ? : Losing weight, if recommended by your provider. ? : Not using any products that contain nicotine or tobacco, such as cigarettes and e-cigarettes. If you need help  quitting, ask your health care provider. ? : Doing exercises as directed by your provider. ? : Having dental care and routine cleanings completed before your procedure. (You should not have dental work done for 3 months after your procedure.) The exact tests and frequency of office visits will be decided by your health care provider based on your individual condition. What are the restrictions after?  Restrictions may include: ? Surgery ? Needing to rest after pain-relief therapies.: Not taking baths, swimming, or using a hot tub until your provider approves. ? Not driving, if you are taking medicines that make you sleepy.: Not playing contact sports until your provider approves. ? Avoiding activities that require a lot of energy for a couple of days after knee injections.: Not driving or using heavy machinery until your provider approves. What can I expect from recovery?  You may experience: ? Surgery ? Tiredness, after therapies for pain relief.: Pain. Medicines and pain relief techniques will help make pain tolerable. ? Needing to move your knee through its full range of motion after knee injections, to get all of the medicine into your knee joint.: Fluid draining from your incision through a tube (drain) for a couple of days. ? Monitoring for a few hours after knee injections to make sure you do not have a reaction to the medicine.: Limited range of motion in the knee that gradually improves over time. What is the impact to quality of life?  Impact to quality of life may include: ? Surgery ? Living with certain side effects of medicines.: Needing to stay in the hospital for a few days after your procedure. ? Needing up to five injections over a period of five weeks, for certain kinds of hyaluronic acid injections.: Needing to use a walker for several weeks. ? Limited physical activity, depending on your symptoms.: Limited physical activity for several weeks. ? Impact to your daily  schedule, including work, to make time for therapy and follow-up visits.: Impact to your daily schedule, including work, to make time for therapy and follow-up visits. ? : ? : The frequency of office visits, therapy, and injections will be decided by your health care provider and care team based on your individual condition. What is the cost?  Costs may include: ? Surgery - $$$ ? Ongoing medicines.: Procedures and recovery. ? Office visits.: Office visits. ? Therapy visits.: Therapy visits. ? Knee injection procedures.: ? Assistive devices.: Exact costs will vary based on your location and insurance plan. Contact your insurance company to find out what is covered. Questions to ask your health care provider:  Ask: ? Surgery ? Am I eligible for this option?: Am I eligible for this option? ? What are my personal risks?: What are my personal risks? ? How often will I need office or therapy visits?: How often will I need office or therapy visits? ? What are the chances that I will need future treatment?: What are the chances that I will need another surgery? ? : How effective is the surgery? Follow these instructions at home: Review these options and decide which one may be right for you.  My decision: ? Non-Surgical Treatment ? This is right for me: ? This is not right for   me: ? I am unsure right now: ? Surgery ? This is right for me: ? This is not right for me: ? I am unsure right now:  This information is not intended to replace advice given to you by your health care provider. Make sure you discuss any questions you have with your health care provider. Document Released: 01/27/2016 Document Revised: 01/27/2016 Document Reviewed: 01/27/2016 Elsevier Interactive Patient Education  2018 [employer]  

## 2016-12-31 ENCOUNTER — Emergency Department (HOSPITAL_COMMUNITY)
Admission: EM | Admit: 2016-12-31 | Discharge: 2016-12-31 | Disposition: A | Discharge status: Home / Self Care | Payer: PRIVATE HEALTH INSURANCE | Attending: Emergency Medicine | Admitting: Emergency Medicine

## 2016-12-31 ENCOUNTER — Encounter (HOSPITAL_COMMUNITY): Payer: Self-pay | Admitting: Emergency Medicine

## 2016-12-31 DIAGNOSIS — M545 Low back pain, unspecified: Secondary | ICD-10-CM

## 2016-12-31 DIAGNOSIS — Z79899 Other long term (current) drug therapy: Secondary | ICD-10-CM | POA: Diagnosis not present

## 2016-12-31 LAB — URINALYSIS, ROUTINE W REFLEX MICROSCOPIC
Bilirubin Urine: NEGATIVE
GLUCOSE, UA: NEGATIVE mg/dL
HGB URINE DIPSTICK: NEGATIVE
KETONES UR: NEGATIVE mg/dL
LEUKOCYTES UA: NEGATIVE
Nitrite: NEGATIVE
PROTEIN: NEGATIVE mg/dL
Specific Gravity, Urine: 1.018 (ref 1.005–1.030)
pH: 6 (ref 5.0–8.0)

## 2016-12-31 MED ORDER — HYDROMORPHONE HCL 1 MG/ML IJ SOLN
1.0000 mg | Freq: Once | INTRAMUSCULAR | Status: AC
  Administered 2016-12-31: 1 mg via INTRAMUSCULAR
  Filled 2016-12-31: qty 1

## 2016-12-31 MED ORDER — OXYCODONE-ACETAMINOPHEN 5-325 MG PO TABS: 1.0000 | ORAL_TABLET | ORAL | 0 refills | Status: DC | PRN

## 2016-12-31 MED ORDER — CYCLOBENZAPRINE HCL 10 MG PO TABS: 10.0000 mg | ORAL_TABLET | Freq: Two times a day (BID) | ORAL | 0 refills | Status: DC | PRN

## 2016-12-31 MED ORDER — HYDROMORPHONE HCL 1 MG/ML IJ SOLN
1.0000 mg | Freq: Once | INTRAMUSCULAR | Status: DC
Start: 2016-12-31 — End: 2016-12-31

## 2016-12-31 MED ORDER — NAPROXEN 500 MG PO TABS: 500.0000 mg | ORAL_TABLET | Freq: Two times a day (BID) | ORAL | 0 refills | Status: DC

## 2016-12-31 NOTE — ED Triage Notes (Signed)
Pt reports "muscle spasms" on left side of back causing right leg to go numb at times.  States it started on Thursday and has progressively gotten worse.

## 2016-12-31 NOTE — ED Notes (Signed)
Pt reports relief

## 2016-12-31 NOTE — ED Provider Notes (Signed)
[facility] Provider Note   CSN: [id number] Arrival date & time: 12/31/16  1658     History   Chief Complaint Chief Complaint  Patient presents with  . Back Pain    HPI Jenna Love is a 56 y.o. female.  Muscle spasms in left lower back since Thursday. Patient is tried Aleve and heating pad with minimal relief. No dysuria, hematuria, fever, chills. She has had problems with her back in the past. Severity of pain is moderate. Positioning and palpation make pain worse.      Past Medical History:  Diagnosis Date  . Anxiety   . Panic attacks   . Pneumonia   . Thyroid nodule 2013   Benign biopsy- ENT Dr. Pollyann Kennedy    Patient Active Problem List   Diagnosis Date Noted  . Borderline diabetes 08/29/2016  . Pulmonary nodule, left 04/12/2016  . History of colonic polyps   . Encounter for screening colonoscopy 10/13/2014  . High risk medication use 10/13/2014  . Peripheral edema 05/06/2014  . SOB (shortness of breath) 05/06/2014  . Post-menopausal bleeding 08/20/2013  . Chronic cough 05/20/2013  . Thyroid nodule 04/15/2013  . Hypertriglyceridemia 02/21/2013  . Perimenopausal 09/18/2011  . MDD (major depressive disorder) (HCC) 06/29/2011  . Menorrhagia 06/15/2011  . Insomnia 04/14/2011  . Morbid obesity (HCC) 04/14/2011  . GAD (generalized anxiety disorder) 10/23/2009    Past Surgical History:  Procedure Laterality Date  . CESAREAN SECTION  1992  . COLONOSCOPY WITH PROPOFOL N/A 11/05/2014   Procedure: COLONOSCOPY WITH PROPOFOL;  Surgeon: Corbin Ade, MD;  Location: [facility];  Service: Endoscopy;  Laterality: N/A;  in cecum at 0820, 18 minutes total withdrawel time  . HYSTEROSCOPY W/D&C N/A 10/03/2013   Procedure: DILATATION AND CURETTAGE /HYSTEROSCOPY;  Surgeon: Tilda Burrow, MD;  Location: [facility];  Service: Gynecology;  Laterality: N/A;  . POLYPECTOMY N/A 10/03/2013   Procedure: REMOVAL OF ENDOMETRIAL POLYP;  Surgeon: Tilda Burrow, MD;  Location: [facility];  Service: Gynecology;  Laterality: N/A;  . POLYPECTOMY N/A 11/05/2014   Procedure: POLYPECTOMY;  Surgeon: Corbin Ade, MD;  Location: [facility];  Service: Endoscopy;  Laterality: N/A;    OB History    Gravida Para Term Preterm AB Living   SAB TAB Ectopic Multiple Live Births                   Home Medications    Prior to Admission medications   Medication Sig Start Date End Date Taking? Authorizing Provider  buPROPion (WELLBUTRIN XL) 150 MG 24 hr tablet Take 1 tablet (150 mg total) by mouth daily. 12/15/16  Yes [place], Velna Hatchet, MD  Cholecalciferol (VITAMIN D) 2000 units CAPS Take by mouth.   Yes [provider]  clonazePAM (KLONOPIN) 1 MG tablet TAKE (1) TABLET BY MOUTH THREE TIMES DAILY AS NEEDED. Patient taking differently: Take 1 mg by mouth 3 (three) times daily as needed for anxiety.  09/21/16  Yes [place], Velna Hatchet, MD  sertraline (ZOLOFT) 100 MG tablet Take 1 tablet (100 mg total) by mouth daily. 12/15/16  Yes [place], Velna Hatchet, MD  vitamin E 400 UNIT capsule Take 400 Units by mouth daily.   Yes [provider]  zolpidem (AMBIEN) 10 MG tablet Take 1 tablet (10 mg total) by mouth at bedtime as needed. for insomnia 12/04/16  Yes [place], Velna Hatchet, MD  cyclobenzaprine (FLEXERIL) 10 MG tablet Take 1  tablet (10 mg total) by mouth 2 (two) times daily as needed for muscle spasms. 12/31/16   Donnetta Hutching, MD  furosemide (LASIX) 20 MG tablet Take 1 tablet (20 mg total) by mouth daily. Patient not taking: Reported on 12/31/2016 12/15/16   Salley Scarlet, MD  naproxen (NAPROSYN) 500 MG tablet Take 1 tablet (500 mg total) by mouth 2 (two) times daily. 12/31/16   Donnetta Hutching, MD  oxyCODONE-acetaminophen (PERCOCET) 5-325 MG tablet Take 1 tablet by mouth every 4 (four) hours as needed. 12/31/16   Donnetta Hutching, MD    Family History Family History  Problem Relation Age of Onset  . Diabetes Mother   . Alzheimer's disease Mother   . Cancer Father        lung    . Colon cancer Neg Hx     Social History Social History  Substance Use Topics  . Smoking status: Never Smoker  . Smokeless tobacco: Never Used  . Alcohol use No     Allergies   Patient has no known allergies.   Review of Systems Review of Systems  All other systems reviewed and are negative.    Physical Exam Updated Vital Signs BP 126/89   Pulse (!) 103   Temp 98.5 F (36.9 C) (Tympanic)   Resp 18   Ht  (1.6 m)   Wt 122.5 kg (270 lb)   LMP 05/25/2014   SpO2 95%   BMI 47.83 kg/m   Physical Exam  Constitutional: She is oriented to person, place, and time. She appears well-developed and well-nourished.  Elevated BMI  HENT:  Head: Normocephalic and atraumatic.  Eyes: Conjunctivae are normal.  Neck: Neck supple.  Cardiovascular: Normal rate and regular rhythm.   Pulmonary/Chest: Effort normal and breath sounds normal.  Abdominal: Soft. Bowel sounds are normal.  Musculoskeletal:  Tender musculature left approximately L 2, 3, 4 area.  Neurological: She is alert and oriented to person, place, and time.  Skin: Skin is warm and dry.  Psychiatric: She has a normal mood and affect. Her behavior is normal.  Nursing note and vitals reviewed.    ED Treatments / Results  Labs (all labs ordered are listed, but only abnormal results are displayed) Labs Reviewed  URINALYSIS, ROUTINE W REFLEX MICROSCOPIC - Abnormal; Notable for the following:       Result Value   APPearance HAZY (*)    All other components within normal limits    EKG  EKG Interpretation None       Radiology No results found.  Procedures Procedures (including critical care time)  Medications Ordered in ED Medications  HYDROmorphone (DILAUDID) injection 1 mg (1 mg Intramuscular Given 12/31/16 1754)     Initial Impression / Assessment and Plan / ED Course  I have reviewed the triage vital signs and the nursing notes.  Pertinent labs & imaging results that were available during  my care of the patient were reviewed by me and considered in my medical decision making (see chart for details).   History and physical most consistent with musculoskeletal back pain.Rx Dilantin 1 mg IM   Discharge medications Percocet, Flexeril 10 mg, Naprosyn 500 mg.    Final Clinical Impressions(s) / ED Diagnoses   Final diagnoses:  Acute left-sided low back pain without sciatica    New Prescriptions New Prescriptions   CYCLOBENZAPRINE (FLEXERIL) 10 MG TABLET    Take 1 tablet (10 mg total) by mouth 2 (two) times daily as needed for muscle spasms.  NAPROXEN (NAPROSYN) 500 MG TABLET    Take 1 tablet (500 mg total) by mouth 2 (two) times daily.   OXYCODONE-ACETAMINOPHEN (PERCOCET) 5-325 MG TABLET    Take 1 tablet by mouth every 4 (four) hours as needed.     Donnetta Hutching, MD 12/31/16 2002

## 2016-12-31 NOTE — Discharge Instructions (Signed)
Heating pad; medication for pain, muscle spasm, anti-inflammatory.  Follow-up with your primary care doctor.

## 2016-12-31 NOTE — ED Notes (Signed)
Pt ambulatory to the bathroom. No assist needed. 

## 2016-12-31 NOTE — ED Notes (Signed)
Awaiting disposition.

## 2017-01-01 ENCOUNTER — Encounter: Payer: Self-pay | Admitting: Family Medicine

## 2017-01-02 ENCOUNTER — Other Ambulatory Visit: Payer: Self-pay | Admitting: *Deleted

## 2017-01-02 ENCOUNTER — Ambulatory Visit (HOSPITAL_COMMUNITY)
Admission: RE | Admit: 2017-01-02 | Discharge: 2017-01-02 | Disposition: A | Discharge status: Home / Self Care | Payer: PRIVATE HEALTH INSURANCE | Source: Ambulatory Visit | Attending: Family Medicine | Admitting: Family Medicine

## 2017-01-02 ENCOUNTER — Telehealth: Payer: Self-pay | Admitting: *Deleted

## 2017-01-02 DIAGNOSIS — M8938 Hypertrophy of bone, other site: Secondary | ICD-10-CM | POA: Insufficient documentation

## 2017-01-02 DIAGNOSIS — R059 Cough, unspecified: Secondary | ICD-10-CM

## 2017-01-02 DIAGNOSIS — J189 Pneumonia, unspecified organism: Secondary | ICD-10-CM | POA: Diagnosis not present

## 2017-01-02 DIAGNOSIS — W19XXXA Unspecified fall, initial encounter: Secondary | ICD-10-CM

## 2017-01-02 DIAGNOSIS — R918 Other nonspecific abnormal finding of lung field: Secondary | ICD-10-CM | POA: Diagnosis not present

## 2017-01-02 DIAGNOSIS — R05 Cough: Secondary | ICD-10-CM

## 2017-01-02 DIAGNOSIS — M5136 Other intervertebral disc degeneration, lumbar region: Secondary | ICD-10-CM | POA: Diagnosis not present

## 2017-01-02 DIAGNOSIS — M545 Low back pain: Secondary | ICD-10-CM

## 2017-01-02 MED ORDER — LEVOFLOXACIN 750 MG PO TABS: 750.0000 mg | ORAL_TABLET | Freq: Every day | ORAL | 0 refills | Status: DC

## 2017-01-02 NOTE — Telephone Encounter (Signed)
Call placed to patient and patient made aware.   Patient reports increased coughing, especially at night. Prescription sent to pharmacy for ABTx.   CT ordered for 3 weeks out.

## 2017-01-02 NOTE — Telephone Encounter (Signed)
Notes recorded by Salley Scarlet, MD on 01/02/2017 at 4:59 PM EDT Xray of spine, no fracture she has DDD She can take the flexeril/Naprosyn   Call pt if she has any cough or congestion, PNA symptoms, start Levaquin  once a a day for 1 week,, then we would wait about 3 weeks and get CT of chest   If no symptoms, this is likely scarring and we need to due a repeat CT scan of her chest for lung nodule, right lobe opacity.

## 2017-01-03 ENCOUNTER — Encounter: Payer: Self-pay | Admitting: Family Medicine

## 2017-01-09 ENCOUNTER — Other Ambulatory Visit: Payer: Self-pay | Admitting: Family Medicine

## 2017-01-09 NOTE — Telephone Encounter (Signed)
Okay to refill? 

## 2017-01-09 NOTE — Telephone Encounter (Signed)
Ok to refill??  Last office visit 12/15/2016.  Last refill 09/21/2016, #3 refills.

## 2017-01-10 ENCOUNTER — Encounter: Payer: Self-pay | Admitting: Family Medicine

## 2017-01-11 NOTE — Telephone Encounter (Signed)
Medication called to pharmacy. 

## 2017-01-24 ENCOUNTER — Ambulatory Visit (HOSPITAL_COMMUNITY)
Admission: RE | Admit: 2017-01-24 | Discharge: 2017-01-24 | Disposition: A | Discharge status: Home / Self Care | Payer: PRIVATE HEALTH INSURANCE | Source: Ambulatory Visit | Attending: Family Medicine | Admitting: Family Medicine

## 2017-01-24 DIAGNOSIS — J189 Pneumonia, unspecified organism: Secondary | ICD-10-CM | POA: Diagnosis present

## 2017-01-24 DIAGNOSIS — R05 Cough: Secondary | ICD-10-CM | POA: Insufficient documentation

## 2017-01-24 DIAGNOSIS — R059 Cough, unspecified: Secondary | ICD-10-CM

## 2017-01-24 DIAGNOSIS — K760 Fatty (change of) liver, not elsewhere classified: Secondary | ICD-10-CM | POA: Diagnosis not present

## 2017-01-24 DIAGNOSIS — R911 Solitary pulmonary nodule: Secondary | ICD-10-CM | POA: Diagnosis not present

## 2017-01-24 MED ORDER — IOPAMIDOL (ISOVUE-300) INJECTION 61%
75.0000 mL | Freq: Once | INTRAVENOUS | Status: AC | PRN
Start: 2017-01-24 — End: 2017-01-24
  Administered 2017-01-24: 75 mL via INTRAVENOUS

## 2017-01-25 ENCOUNTER — Encounter: Payer: Self-pay | Admitting: Family Medicine

## 2017-03-07 ENCOUNTER — Other Ambulatory Visit: Payer: Self-pay | Admitting: Family Medicine

## 2017-03-07 NOTE — Telephone Encounter (Signed)
Medication called to pharmacy. 

## 2017-03-07 NOTE — Telephone Encounter (Signed)
Ok to refill??      LOV 12/15/16 LRF 02/07/17

## 2017-03-07 NOTE — Telephone Encounter (Signed)
Okay to refill? 

## 2017-03-27 ENCOUNTER — Encounter: Payer: Self-pay | Admitting: Family Medicine

## 2017-03-27 MED ORDER — ZOLPIDEM TARTRATE 10 MG PO TABS: 10.0000 mg | ORAL_TABLET | Freq: Every evening | ORAL | 3 refills | Status: DC | PRN

## 2017-03-27 NOTE — Telephone Encounter (Signed)
Ok to refill??  Last office visit 12/15/2016.  Last refill 12/04/2016, #3 refills.

## 2017-04-16 ENCOUNTER — Ambulatory Visit: Payer: PRIVATE HEALTH INSURANCE | Admitting: Family Medicine

## 2017-04-16 ENCOUNTER — Other Ambulatory Visit: Payer: Self-pay

## 2017-04-16 ENCOUNTER — Encounter: Payer: Self-pay | Admitting: Family Medicine

## 2017-04-16 VITALS — BP 122/68 | HR 100 | Temp 97.8°F | Resp 16 | Ht 63.0 in | Wt 282.0 lb

## 2017-04-16 DIAGNOSIS — F411 Generalized anxiety disorder: Secondary | ICD-10-CM | POA: Diagnosis not present

## 2017-04-16 DIAGNOSIS — Z630 Problems in relationship with spouse or partner: Secondary | ICD-10-CM | POA: Diagnosis not present

## 2017-04-16 DIAGNOSIS — F331 Major depressive disorder, recurrent, moderate: Secondary | ICD-10-CM

## 2017-04-16 MED ORDER — SERTRALINE HCL 100 MG PO TABS: 150.0000 mg | ORAL_TABLET | Freq: Every day | ORAL | 6 refills | Status: DC
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
## 2017-04-16 NOTE — Assessment & Plan Note (Signed)
Increase zoloft to 150mg  Continue klonopin Try to decrease the benzo use by increasing her zoloft Will give intermittant FMLA so she can have her therapy visits and visits for medications here at the office without her job being in jeopardy Continue ambien  F/U 4 weeks She will call back with date of her therapy appt at [facility]

## 2017-04-16 NOTE — Patient Instructions (Addendum)
Fax [phone] Call me with appointment time for [facility] Zoloft increased to  150mg   F/U 4 week

## 2017-04-17 ENCOUNTER — Encounter: Payer: Self-pay | Admitting: Family Medicine

## 2017-04-17 ENCOUNTER — Telehealth: Payer: Self-pay | Admitting: Family Medicine

## 2017-04-17 NOTE — Telephone Encounter (Signed)
Received FMLA forms from patient.  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   Forms routed to provider.

## 2017-04-17 NOTE — Telephone Encounter (Signed)
Received fmla ppw for this patient  Will route this to christina

## 2017-04-30 ENCOUNTER — Encounter: Payer: Self-pay | Admitting: Family Medicine

## 2017-05-02 ENCOUNTER — Encounter: Payer: Self-pay | Admitting: Family Medicine

## 2017-05-02 MED ORDER — CLONAZEPAM 1 MG PO TABS: ORAL_TABLET | ORAL | 1 refills | Status: DC

## 2017-05-02 NOTE — Telephone Encounter (Signed)
Ok to refill??  Last office visit 04/16/2017.  Last refill 03/07/2017, #1 refill.

## 2017-05-04 ENCOUNTER — Encounter: Payer: Self-pay | Admitting: Family Medicine

## 2017-05-04 ENCOUNTER — Ambulatory Visit (INDEPENDENT_AMBULATORY_CARE_PROVIDER_SITE_OTHER): Payer: PRIVATE HEALTH INSURANCE | Admitting: Family Medicine

## 2017-05-04 VITALS — BP 124/80 | HR 96 | Temp 98.1°F | Resp 16 | Ht 63.0 in | Wt 283.0 lb

## 2017-05-04 DIAGNOSIS — F411 Generalized anxiety disorder: Secondary | ICD-10-CM

## 2017-05-04 DIAGNOSIS — F5104 Psychophysiologic insomnia: Secondary | ICD-10-CM

## 2017-05-04 DIAGNOSIS — F331 Major depressive disorder, recurrent, moderate: Secondary | ICD-10-CM | POA: Diagnosis not present

## 2017-05-04 NOTE — Assessment & Plan Note (Signed)
Significant depression with sources of stress at home and the fact that her grandson passed away.  We discussed maybe she needs to take an extended period of time out of work but she actually thinks that work helps keep her busy and she does not want to go out.  She is going to continue with her psychotherapist in the current dose of Zoloft as well as clonazepam and Ambien for sleep.  She is still pursuing moving out of her home and separating from her husband.  No suicidal ideations states that she has been productive at work and has no issues otherwise.

## 2017-05-04 NOTE — Progress Notes (Signed)
   Subjective:    Patient ID: Jenna Love, female    DOB: 1961-03-09, 57 y.o.   MRN: [id number]  Patient presents for Medication Management Patient here to discuss her medications.  She sent in a couple of patient email is noted that she Had called out of work because of her depression.  She did send an email saying that she was dizzy and thought it may been the Zoloft.  After further discussion today she states that she was just quite depressed and the dizziness was only a few seconds and she panicked.  She has been on the higher dose of Zoloft before and done well on it.  Suggestion was to see her therapist that same evening and felt much better afterwards.  She has not been sick and has not had any headache no cough congestion no fever.  She states that things are difficult at home she did see her lawyer about her assess whether of leaving her husband will cause her financial stress.  She is still planning to move out of the house at some time.  She has a follow-up with her therapist again on the 25th.  Jenna Love [phone] , next f/u on Feb 25th  Review Of Systems:  GEN- denies fatigue, fever, weight loss,weakness, recent illness HEENT- denies eye drainage, change in vision, nasal discharge, CVS- denies chest pain, palpitations RESP- denies SOB, cough, wheeze Neuro- denies headache, dizziness, syncope, seizure activity       Objective:    BP 124/80   Pulse 96   Temp 98.1 F (36.7 C) (Oral)   Resp 16   Ht 5\' 3"  (1.6 m)   Wt 283 lb (128.4 kg)   LMP 05/25/2014   SpO2 97%   BMI 50.13 kg/m  GEN- NAD, alert and oriented x3 HEENT- PERRL, EOMI, non injected sclera, pink conjunctiva, MMM, oropharynx clear, TM clear bilat no effusion  Neck- Supple, no thyromegaly, no bruit  CVS- RRR, no murmur RESP-CTAB Neuro-CNII-XII in tact no deficits Psych- depressed, not anxious, no SI, well groomed, good eye contact  EXT- No edema Pulses- Radial, 2+  Lying 122/78        Assessment & Plan:      Problem List Items Addressed This Visit      Unprioritized   Insomnia   GAD (generalized anxiety disorder)   MDD (major depressive disorder) (HCC) - Primary    Significant depression with sources of stress at home and the fact that her grandson passed away.  We discussed maybe she needs to take an extended period of time out of work but she actually thinks that work helps keep her busy and she does not want to go out.  She is going to continue with her psychotherapist in the current dose of Zoloft as well as clonazepam and Ambien for sleep.  She is still pursuing moving out of her home and separating from her husband.  No suicidal ideations states that she has been productive at work and has no issues otherwise.         Note: This dictation was prepared with Dragon dictation along with smaller phrase technology. Any transcriptional errors that result from this process are unintentional.

## 2017-05-04 NOTE — Patient Instructions (Signed)
F/U 2 months  

## 2017-05-29 ENCOUNTER — Other Ambulatory Visit: Payer: Self-pay | Admitting: Family Medicine

## 2017-05-29 NOTE — Telephone Encounter (Signed)
Ok to refill??  Last office visit 05/04/2017.  Last refill 05/02/2017. 

## 2017-06-11 ENCOUNTER — Encounter: Payer: Self-pay | Admitting: Family Medicine

## 2017-07-10 ENCOUNTER — Other Ambulatory Visit: Payer: Self-pay | Admitting: Family Medicine

## 2017-07-10 DIAGNOSIS — Z1231 Encounter for screening mammogram for malignant neoplasm of breast: Secondary | ICD-10-CM

## 2017-07-16 ENCOUNTER — Encounter: Payer: Self-pay | Admitting: Family Medicine

## 2017-07-16 MED ORDER — ZOLPIDEM TARTRATE 10 MG PO TABS: 10.0000 mg | ORAL_TABLET | Freq: Every evening | ORAL | 3 refills | Status: DC | PRN

## 2017-07-16 NOTE — Telephone Encounter (Signed)
Ok to refill??  Last office visit 05/04/2017.  Last refill 03/27/2017, #3 refills.

## 2017-08-16 ENCOUNTER — Other Ambulatory Visit: Payer: PRIVATE HEALTH INSURANCE | Admitting: Advanced Practice Midwife

## 2017-09-13 ENCOUNTER — Encounter: Payer: Self-pay | Admitting: Family Medicine

## 2017-09-13 ENCOUNTER — Ambulatory Visit: Payer: Self-pay | Admitting: Family Medicine

## 2017-09-13 VITALS — BP 130/80 | HR 80 | Temp 98.0°F | Resp 18 | Ht 63.0 in | Wt 283.0 lb

## 2017-09-13 DIAGNOSIS — J069 Acute upper respiratory infection, unspecified: Secondary | ICD-10-CM

## 2017-09-13 DIAGNOSIS — R059 Cough, unspecified: Secondary | ICD-10-CM

## 2017-09-13 DIAGNOSIS — R05 Cough: Secondary | ICD-10-CM

## 2017-09-13 MED ORDER — PREDNISONE 20 MG PO TABS: 40.0000 mg | ORAL_TABLET | Freq: Every day | ORAL | 0 refills | Status: AC

## 2017-09-13 MED ORDER — ALBUTEROL SULFATE HFA 108 (90 BASE) MCG/ACT IN AERS: 2.0000 | INHALATION_SPRAY | RESPIRATORY_TRACT | 0 refills | Status: DC | PRN

## 2017-09-13 MED ORDER — HYDROCOD POLST-CPM POLST ER 10-8 MG/5ML PO SUER: 5.0000 mL | Freq: Two times a day (BID) | ORAL | 0 refills | Status: DC | PRN

## 2017-09-13 MED ORDER — DOXYCYCLINE HYCLATE 100 MG PO TABS: 100.0000 mg | ORAL_TABLET | Freq: Two times a day (BID) | ORAL | 0 refills | Status: AC

## 2017-09-13 NOTE — Progress Notes (Signed)
Patient ID: ARDEL Love, female    DOB: [birth year], 57 y.o.   MRN: [id number]  PCP: Salley Scarlet, MD  Chief Complaint  Patient presents with  . Head & chest congestion, cough, st, runny nose, fever -100.3    Subjective:   Jenna Love is a 57 y.o. female, presents to clinic with CC of 4-5 days of head cold and chest congestion with productive cough with green sputum and chest tightness. Associated scratchy sore throat, runny nose, fever to Tmax 100.3 with chills and generalized malaise.  + Sick contact at work.  She reports hx of pneumonia x 2 and pulmonary nodules.   She has associated mild wheeze and SOB with coughing fits, no sweats, no weightloss.     Patient Active Problem List   Diagnosis Date Noted  . Borderline diabetes 08/29/2016  . Pulmonary nodule, left 04/12/2016  . History of colonic polyps   . Encounter for screening colonoscopy 10/13/2014  . High risk medication use 10/13/2014  . Peripheral edema 05/06/2014  . SOB (shortness of breath) 05/06/2014  . Post-menopausal bleeding 08/20/2013  . Chronic cough 05/20/2013  . Thyroid nodule 04/15/2013  . Hypertriglyceridemia 02/21/2013  . Perimenopausal 09/18/2011  . MDD (major depressive disorder) (HCC) 06/29/2011  . Menorrhagia 06/15/2011  . Insomnia 04/14/2011  . Morbid obesity (HCC) 04/14/2011  . GAD (generalized anxiety disorder) 10/23/2009     Prior to Admission medications   Medication Sig Start Date End Date Taking? Authorizing Provider  Cholecalciferol (VITAMIN D) 2000 units CAPS Take by mouth.   Yes [provider]  clonazePAM (KLONOPIN) 1 MG tablet TAKE (1) TABLET BY MOUTH THREE TIMES DAILY AS NEEDED FOR ANXIETY. 05/29/17  Yes [place], Velna Hatchet, MD  furosemide (LASIX) 20 MG tablet Take 1 tablet (20 mg total) by mouth daily. 12/15/16  Yes [place], Velna Hatchet, MD  sertraline (ZOLOFT) 100 MG tablet Take 1.5 tablets (150 mg total) by mouth daily. 04/16/17  Yes [place], Velna Hatchet, MD  vitamin  E 400 UNIT capsule Take 400 Units by mouth daily.   Yes [provider]  zolpidem (AMBIEN) 10 MG tablet Take 1 tablet (10 mg total) by mouth at bedtime as needed. for insomnia 07/16/17  Yes , Velna Hatchet, MD     No Known Allergies   Family History  Problem Relation Age of Onset  . Diabetes Mother   . Alzheimer's disease Mother   . Cancer Father        lung   . Colon cancer Neg Hx      Social History   Socioeconomic History  . Marital status: Married    Spouse name: Not on file  . Number of children: 2  . Years of education: Not on file  . Highest education level: Not on file  Occupational History  . Occupation: Midwife for [employer]  Social Needs  . Financial resource strain: Not on file  . Food insecurity:    Worry: Not on file    Inability: Not on file  . Transportation needs:    Medical: Not on file    Non-medical: Not on file  Tobacco Use  . Smoking status: Never Smoker  . Smokeless tobacco: Never Used  Substance and Sexual Activity  . Alcohol use: No  . Drug use: No  . Sexual activity: Yes    Birth control/protection: None  Lifestyle  . Physical activity:    Days per week: Not on file    Minutes per  session: Not on file  . Stress: Not on file  Relationships  . Social connections:    Talks on phone: Not on file    Gets together: Not on file    Attends religious service: Not on file    Active member of club or organization: Not on file    Attends meetings of clubs or organizations: Not on file    Relationship status: Not on file  . Intimate partner violence:    Fear of current or ex partner: Not on file    Emotionally abused: Not on file    Physically abused: Not on file    Forced sexual activity: Not on file  Other Topics Concern  . Not on file  Social History Narrative  . Not on file     Review of Systems  All other systems reviewed and are negative. 10 Systems reviewed and are negative for acute change except as noted  in the HPI.       Objective:    Vitals:   09/13/17 1548  BP: 130/80  Pulse: 80  Resp: 18  Temp: 98 F (36.7 C)  TempSrc: Oral  SpO2: 97%  Weight: 283 lb (128.4 kg)  Height: 5\' 3"  (1.6 m)      Physical Exam  Constitutional: She is oriented to person, place, and time. She appears well-developed and well-nourished.  Non-toxic appearance. No distress.  mildly ill appearing female, non-toxic, no distress  HENT:  Head: Normocephalic and atraumatic.  Right Ear: External ear normal.  Left Ear: External ear normal.  Nose: Mucosal edema and rhinorrhea present. Right sinus exhibits no maxillary sinus tenderness and no frontal sinus tenderness. Left sinus exhibits no maxillary sinus tenderness and no frontal sinus tenderness.  Mouth/Throat: Uvula is midline and mucous membranes are normal. Mucous membranes are not pale and not dry. Posterior oropharyngeal erythema present. No oropharyngeal exudate or posterior oropharyngeal edema.  Eyes: Pupils are equal, round, and reactive to light. Conjunctivae, EOM and lids are normal.  Neck: Normal range of motion and phonation normal. Neck supple. No tracheal deviation present.  Cardiovascular: Normal rate, regular rhythm, normal heart sounds and normal pulses. Exam reveals no gallop and no friction rub.  No murmur heard. Pulses:      Radial pulses are 2+ on the right side, and 2+ on the left side.       Posterior tibial pulses are 2+ on the right side, and 2+ on the left side.  Pulmonary/Chest: Effort normal. No accessory muscle usage or stridor. No tachypnea. No respiratory distress. She has decreased breath sounds in the right lower field and the left lower field. She has wheezes. She has no rhonchi. She has no rales. She exhibits no tenderness.  Abdominal: Soft. Normal appearance and bowel sounds are normal. She exhibits no distension. There is no tenderness. There is no rebound and no guarding.  Musculoskeletal: Normal range of motion. She  exhibits no edema or deformity.  Lymphadenopathy:    She has no cervical adenopathy.  Neurological: She is alert and oriented to person, place, and time. She exhibits normal muscle tone. Gait normal.  Skin: Skin is warm, dry and intact. Capillary refill takes less than 2 seconds. No rash noted. She is not diaphoretic. No pallor.  Psychiatric: She has a normal mood and affect. Her speech is normal and behavior is normal.  Nursing note and vitals reviewed.         Assessment & Plan:      ICD-10-CM  1. Upper respiratory tract infection, unspecified type J06.9   2. Cough R05 predniSONE (DELTASONE) 20 MG tablet    albuterol (PROVENTIL HFA;VENTOLIN HFA) 108 (90 Base) MCG/ACT inhaler    chlorpheniramine-HYDROcodone (TUSSIONEX PENNKINETIC ER) 10-8 MG/5ML SUER    Pt with URI sx and wheezy cough x 4-5 days, concerned with past hx of CAP and pulm nodules - last CT reviewed, pulm nodules reviewed and small 5mm RML nodule did not need further scans or monitoring. On exam pt appears consistent with URI and bronchitis.  Lower lobes with mildly diminished BS and faint exp wheeze which improved with duoneb.  Recheck lung exam was CTA A&P. Pt with VSS, no distress. Will tx with pred burst, albuterol, cough meds and sx and supportive meds.  Printed doxycycline abx rx to hold for any worsening sx of sinusitis or respiratory.   Danelle Berry, PA-C 09/13/17 4:25 PM

## 2017-09-13 NOTE — Patient Instructions (Addendum)
Use Goodrx.com for coupons and best cash prices of medications at pharmacies in your area (put in your zipcode).  Start treatment with over the counter meds like allergy meds, sudafed, and MUCINEX (definitely start mucinex)  Take the steroids and use inhaler as prescribed.  If you do not start to improve in 2-4 days, or if your shortness of breath, fevers, fatigue continue for more than 3-4 days or worsen, then I would start the antibiotic.  Right now I believe you have a VIRAL illness in your nose and chest (AKA upper respiratory infection) and it can lead to pneumonia or a bacterial sinus infection, right now I do not believe you have a bacterial infection and we can wait a few more days, while using meds to help with tightness, wheeze and cough.  Please return for recheck if not improving in 1-2 weeks.      Acute Bronchitis, Adult Acute bronchitis is sudden (acute) swelling of the air tubes (bronchi) in the lungs. Acute bronchitis causes these tubes to fill with mucus, which can make it hard to breathe. It can also cause coughing or wheezing. In adults, acute bronchitis usually goes away within 2 weeks. A cough caused by bronchitis may last up to 3 weeks. Smoking, allergies, and asthma can make the condition worse. Repeated episodes of bronchitis may cause further lung problems, such as chronic obstructive pulmonary disease (COPD). What are the causes? This condition can be caused by germs and by substances that irritate the lungs, including:  Cold and flu viruses. This condition is most often caused by the same virus that causes a cold.  Bacteria.  Exposure to tobacco smoke, dust, fumes, and air pollution.  What increases the risk? This condition is more likely to develop in people who:  Have close contact with someone with acute bronchitis.  Are exposed to lung irritants, such as tobacco smoke, dust, fumes, and vapors.  Have a weak immune system.  Have a respiratory condition  such as asthma.  What are the signs or symptoms? Symptoms of this condition include:  A cough.  Coughing up clear, yellow, or green mucus.  Wheezing.  Chest congestion.  Shortness of breath.  A fever.  Body aches.  Chills.  A sore throat.  How is this diagnosed? This condition is usually diagnosed with a physical exam. During the exam, your health care provider may order tests, such as chest X-rays, to rule out other conditions. He or she may also:  Test a sample of your mucus for bacterial infection.  Check the level of oxygen in your blood. This is done to check for pneumonia.  Do a chest X-ray or lung function testing to rule out pneumonia and other conditions.  Perform blood tests.  Your health care provider will also ask about your symptoms and medical history. How is this treated? Most cases of acute bronchitis clear up over time without treatment. Your health care provider may recommend:  Drinking more fluids. Drinking more makes your mucus thinner, which may make it easier to breathe.  Taking a medicine for a fever or cough.  Taking an antibiotic medicine.  Using an inhaler to help improve shortness of breath and to control a cough.  Using a cool mist vaporizer or humidifier to make it easier to breathe.  Follow these instructions at home: Medicines  Take over-the-counter and prescription medicines only as told by your health care provider.  If you were prescribed an antibiotic, take it as told by your health  care provider. Do not stop taking the antibiotic even if you start to feel better. General instructions  Get plenty of rest.  Drink enough fluids to keep your urine clear or pale yellow.  Avoid smoking and secondhand smoke. Exposure to cigarette smoke or irritating chemicals will make bronchitis worse. If you smoke and you need help quitting, ask your health care provider. Quitting smoking will help your lungs heal faster.  Use an inhaler,  cool mist vaporizer, or humidifier as told by your health care provider.  Keep all follow-up visits as told by your health care provider. This is important. How is this prevented? To lower your risk of getting this condition again:  Wash your hands often with soap and water. If soap and water are not available, use hand sanitizer.  Avoid contact with people who have cold symptoms.  Try not to touch your hands to your mouth, nose, or eyes.  Make sure to get the flu shot every year.  Contact a health care provider if:  Your symptoms do not improve in 2 weeks of treatment. Get help right away if:  You cough up blood.  You have chest pain.  You have severe shortness of breath.  You become dehydrated.  You faint or keep feeling like you are going to faint.  You keep vomiting.  You have a severe headache.  Your fever or chills gets worse. This information is not intended to replace advice given to you by your health care provider. Make sure you discuss any questions you have with your health care provider. Document Released: 04/13/2004 Document Revised: 09/29/2015 Document Reviewed: 08/25/2015 Elsevier Interactive Patient Education  [employer]2018 [employer]

## 2017-09-17 ENCOUNTER — Encounter: Payer: Self-pay | Admitting: Family Medicine

## 2017-09-18 MED ORDER — CLONAZEPAM 1 MG PO TABS: 1.0000 mg | ORAL_TABLET | Freq: Three times a day (TID) | ORAL | 2 refills | Status: DC | PRN

## 2017-09-18 NOTE — Telephone Encounter (Signed)
Ok to refill??  Last office visit 09/13/2017.  Last refill 05/29/2017, 32 refills.

## 2017-10-04 IMAGING — CT CT ANGIO CHEST
2 of 6 series · 18 of 36 positions shown · IV contrast (Isovue)
Comparison: None

CLINICAL DATA: Recent pneumonia. Chest pain and shortness of
breath.

EXAM:
CT ANGIOGRAPHY CHEST WITH CONTRAST
TECHNIQUE: Multidetector CT imaging of the chest was performed using the
standard protocol during bolus administration of intravenous
contrast. Multiplanar CT image reconstructions and MIPs were
obtained to evaluate the vascular anatomy.
CONTRAST:  100 cc of Isovue 370

[Series 5: thins · axial · 0.65mm/px · z∈[+1326,+1554]mm · 17 of 254 slices shown]
[im 13/254  lung]
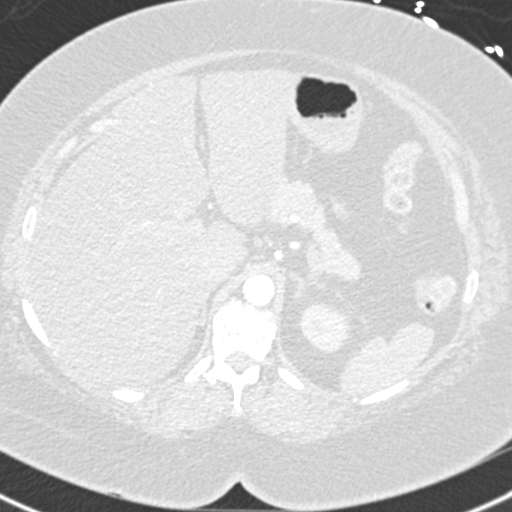
[im 26/254  mediastinal]
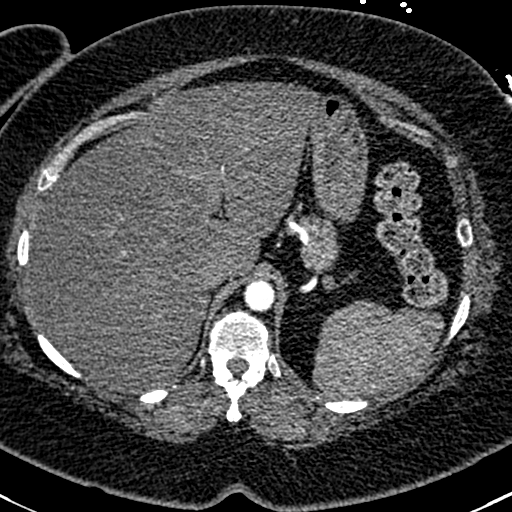
[im 38/254  lung]
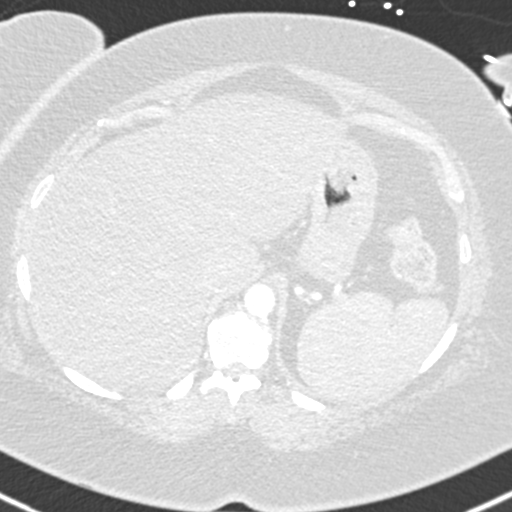
[im 51/254  mediastinal]
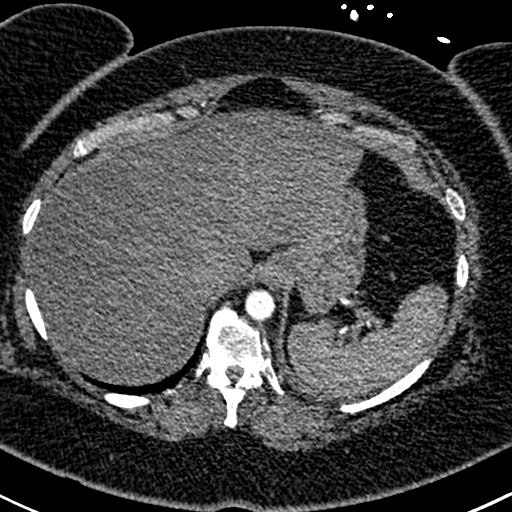
[im 76/254  lung]
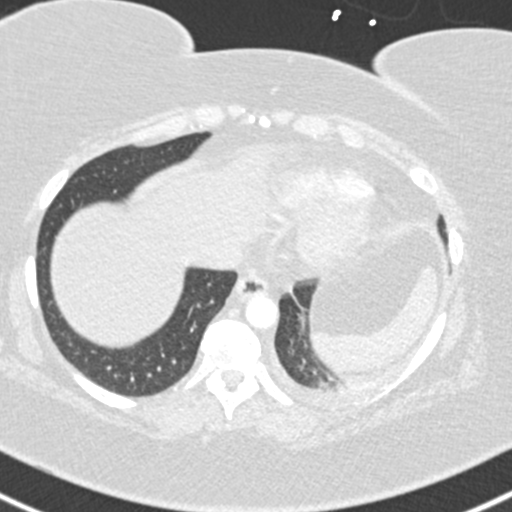
[im 89/254  mediastinal]
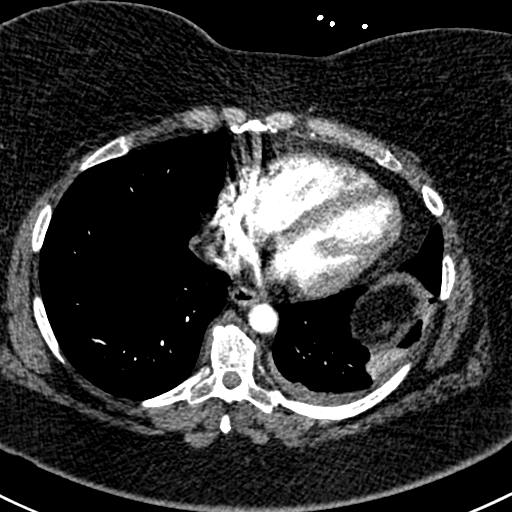
[im 102/254  lung]
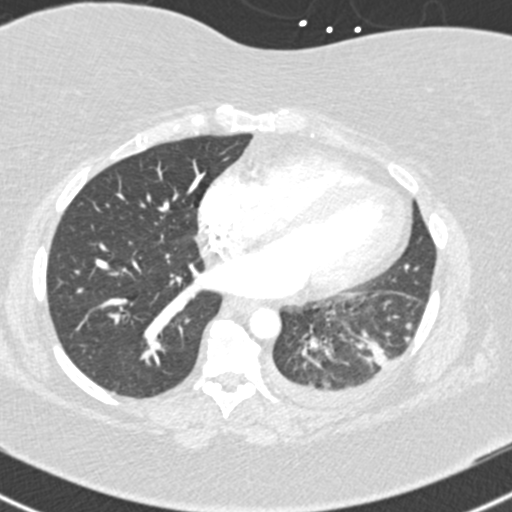
[im 114/254  mediastinal]
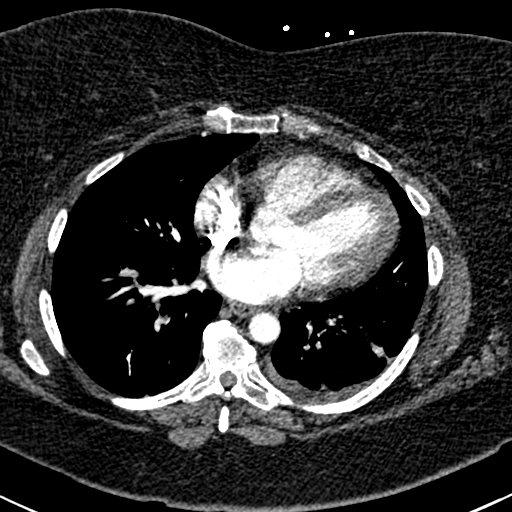
[im 127/254  lung]
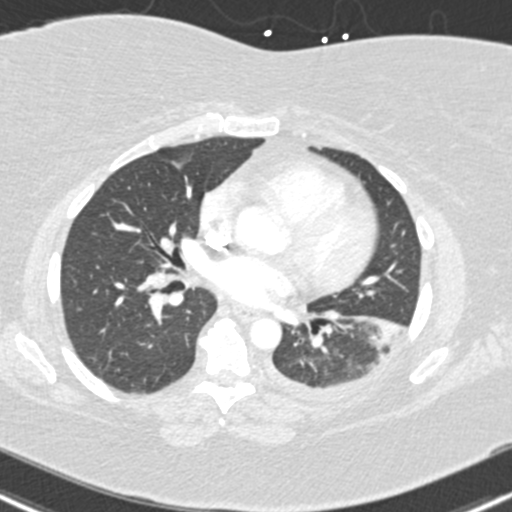
[im 140/254  mediastinal]
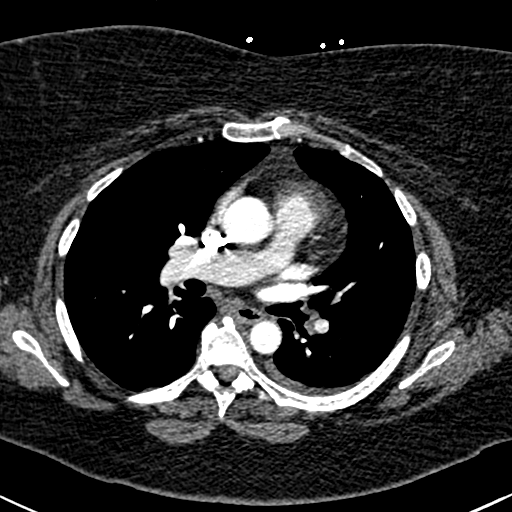
[im 152/254  lung]
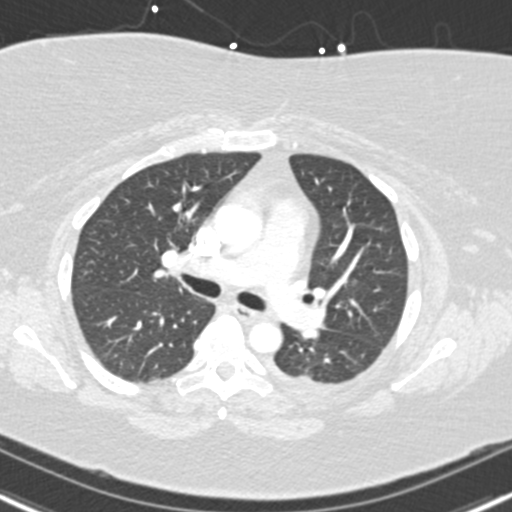
[im 165/254  mediastinal]
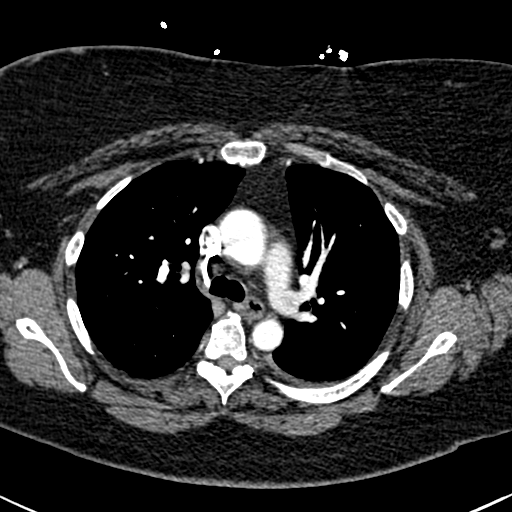
[im 178/254  lung]
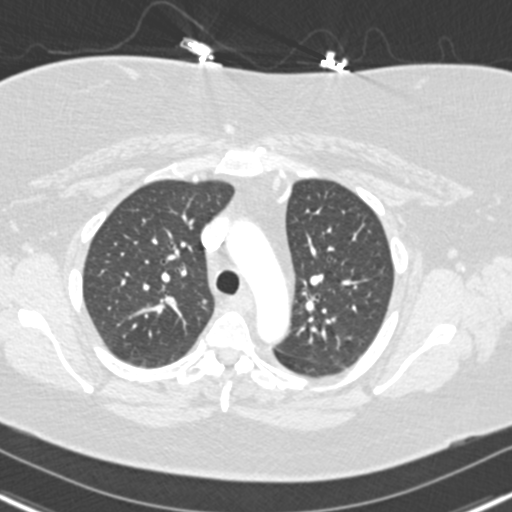
[im 203/254  mediastinal]
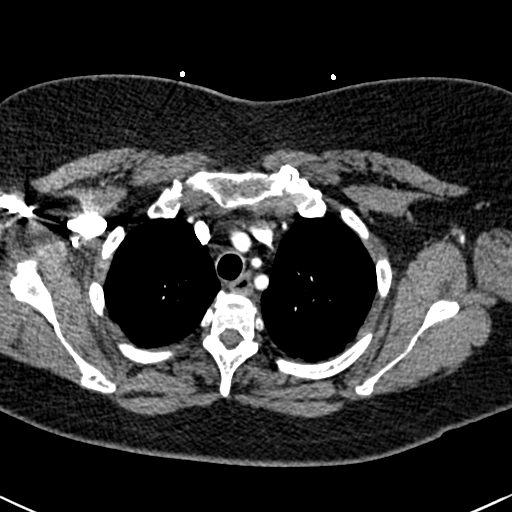
[im 216/254  lung]
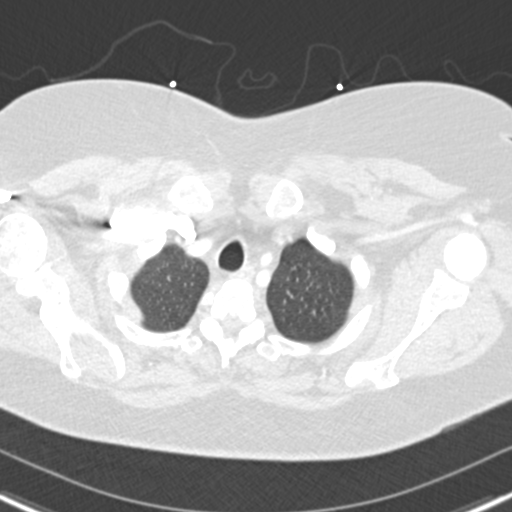
[im 228/254  mediastinal]
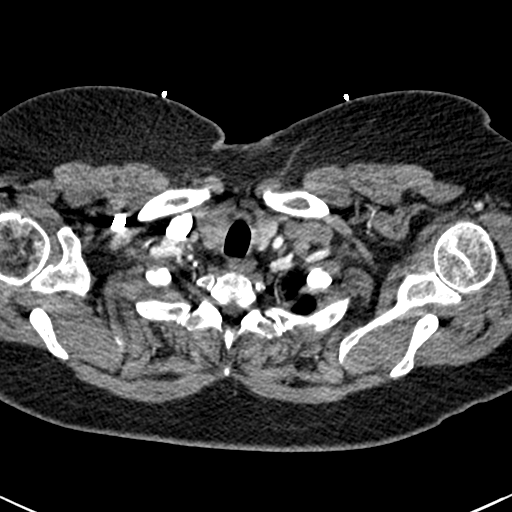
[im 241/254  lung]
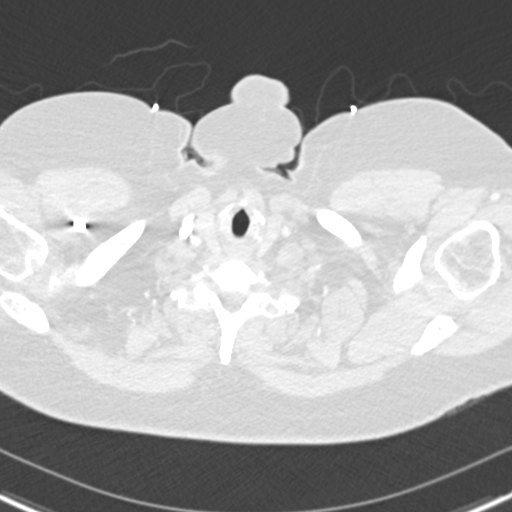

[Series 7: coronal mpr · coronal · 0.52mm/px · 1 of 151 slices shown]
[im 76/151  mediastinal]
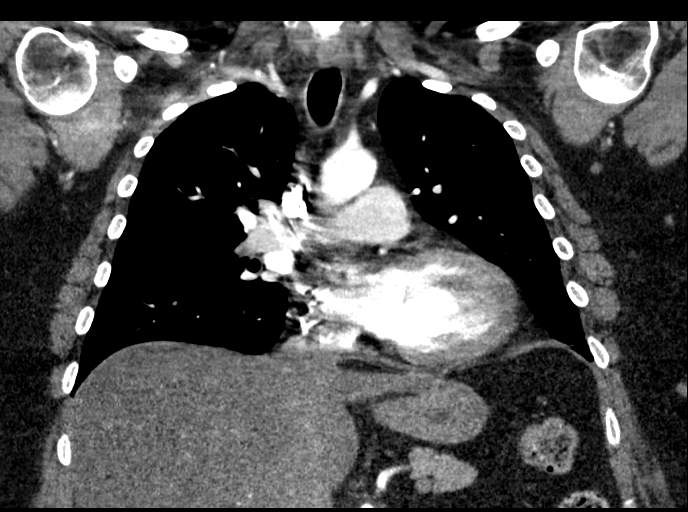

[18 of 36 positions shown; findings below may reference images not displayed]

FINDINGS: Cardiovascular: The heart size is normal. No pericardial effusion.
Mild aortic atherosclerosis. Sub optimal opacification of the lobar
and segmental pulmonary arteries. There is preferential contrast
opacification of the pulmonary veins, left atrium and thoracic
aorta. The main pulmonary artery appears patent. Within this
limitation no definite evidence for lobar or segmental pulmonary
artery filling defects identified.

Mediastinum/Nodes: The trachea appears patent and is midline. Normal
appearance of the esophagus. No mediastinal or hilar adenopathy.

Lungs/Pleura: Small left pleural effusion identified. Overlying
subsegmental atelectasis and ground-glass attenuation is identified.
There are 2 nodules within the left lower lobe, image 52 of series
6. The larger measures 6 mm. Right middle lobe pulmonary nodule
measures 3 mm, image 42 of series 6.

Upper Abdomen: Hepatic steatosis identified. No acute abnormality
noted within the upper abdomen. Indeterminate left adrenal nodule
measures 1.9 cm and 21 HU.

Musculoskeletal: Degenerative disc disease throughout the thoracic
spine. No aggressive lytic or sclerotic bone lesions.

Review of the MIP images confirms the above findings.
IMPRESSION: 1. Exam detail diminished due to suboptimal pulmonary arterial
opacification with preferential opacification of the pulmonary
veins, left atrium and aorta. Within this limitation there is no
evidence for central obstructing pulmonary embolus. If there is a
high clinical concern for small peripheral pulmonary embolus exam
may be repeated.
2. Left pleural effusion with overlying atelectasis and ground-glass
attenuation. Findings may reflect resolving infection.
3. Left lower lobe pulmonary nodules. Non-contrast chest CT at 3-6
months is recommended. If the nodules are stable at time of repeat
CT, then future CT at 18-24 months (from today's scan) is considered
optional for low-risk patients, but is recommended for high-risk
patients. This recommendation follows the consensus statement:
Guidelines for Management of Incidental Pulmonary Nodules Detected
4. Hepatic steatosis.

## 2017-10-04 IMAGING — DX DG CHEST 2V
2 series · 2 of 2 positions shown · non-contrast
Comparison: Radiographs March 27, 2016.

CLINICAL DATA: Cough, shortness of breath.

EXAM:
CHEST  2 VIEW

[chest pa]
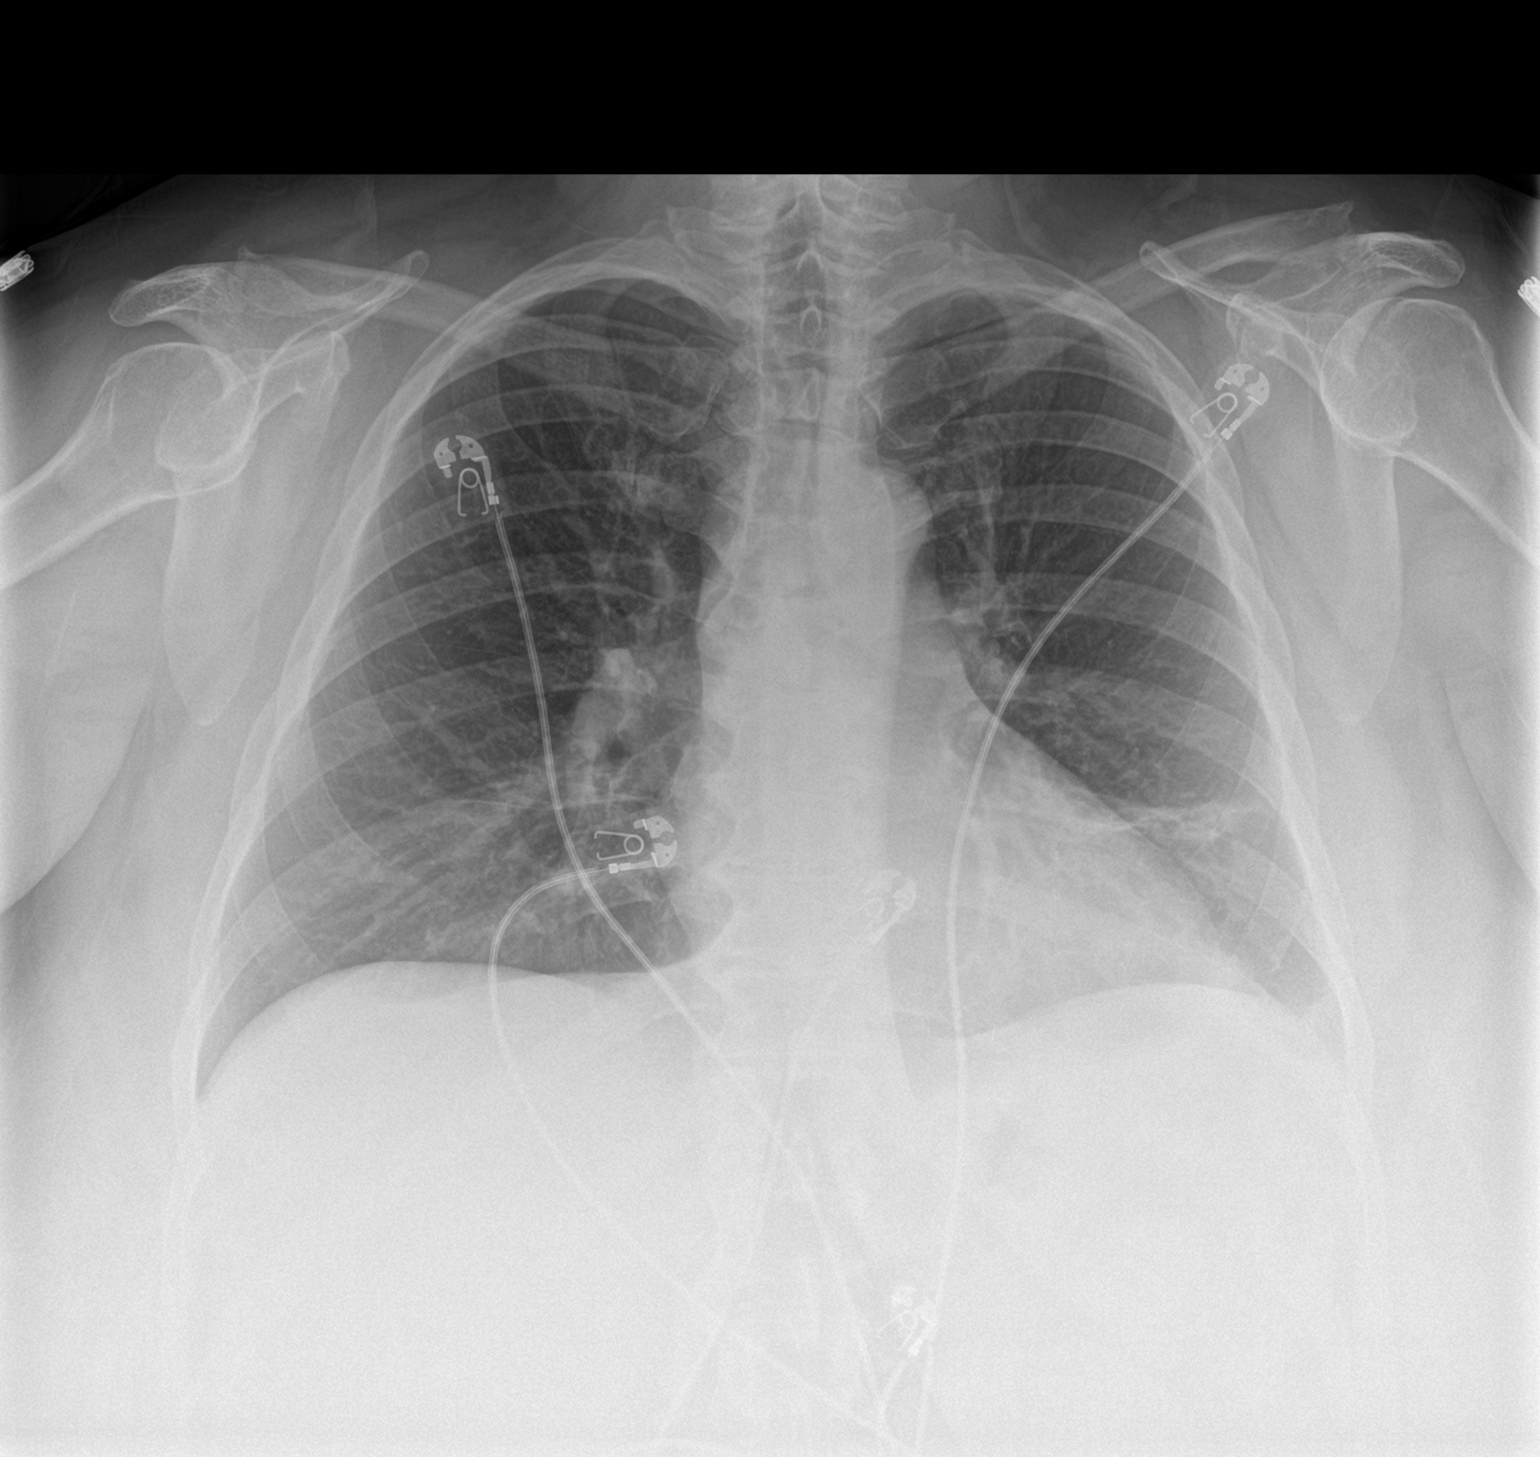

[chest lat]
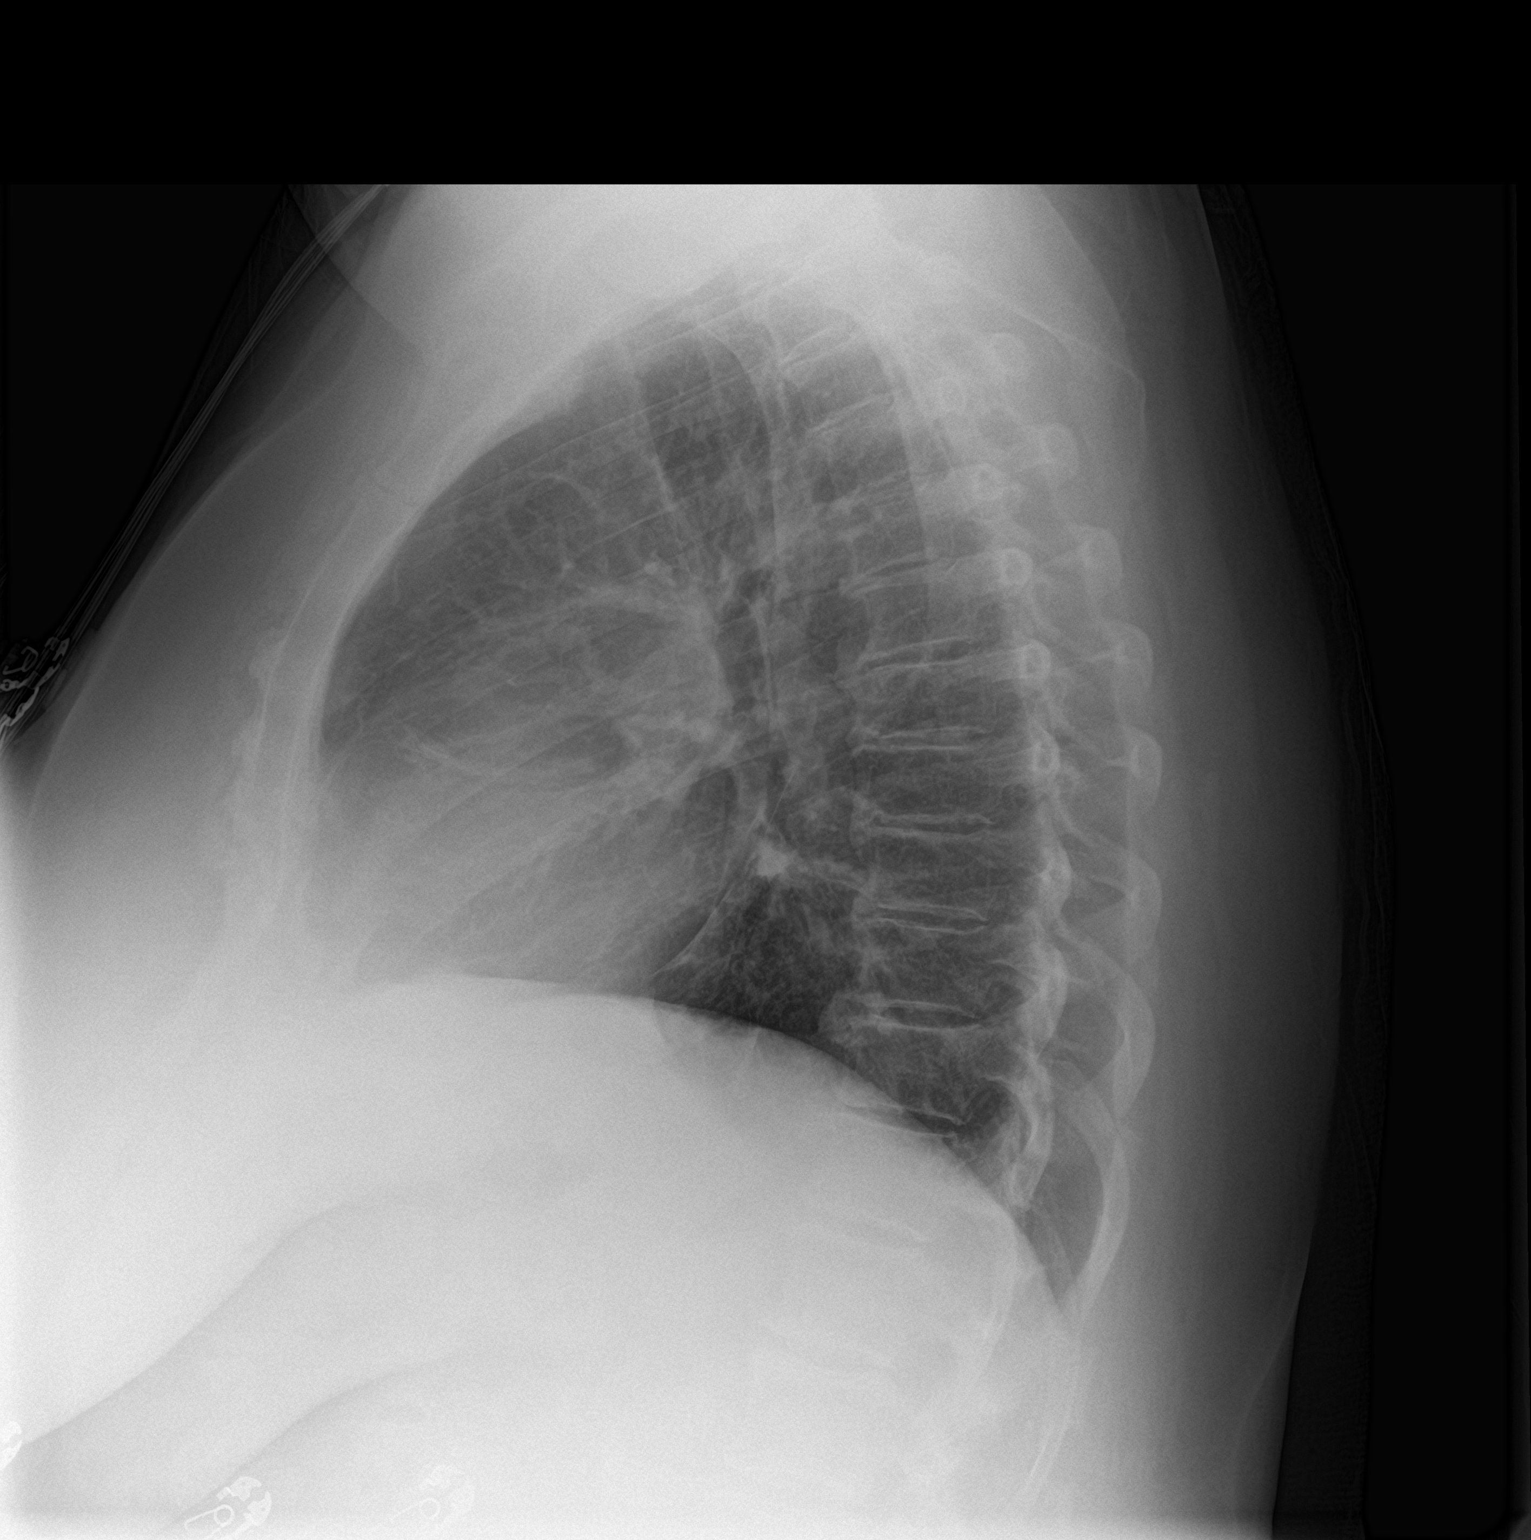

[2 of 2 positions shown; findings below may reference images not displayed]

FINDINGS: The heart size and mediastinal contours are within normal limits. No
pneumothorax or pleural effusion is noted. Right lung is clear. Mild
left basilar opacity is noted which is significantly improved
compared to prior exam. The visualized skeletal structures are
unremarkable.
IMPRESSION: Significantly improved left basilar opacity suggesting resolving
atelectasis or pneumonia. Continued radiographic follow-up is
recommended to ensure resolution.

## 2017-10-09 ENCOUNTER — Other Ambulatory Visit: Payer: Self-pay | Admitting: Family Medicine

## 2017-10-11 NOTE — Telephone Encounter (Signed)
Ok to refill??  Last office visit 09/13/2017.  Last refill 07/16/2017, #3 refills.

## 2017-10-25 DIAGNOSIS — B86 Scabies: Secondary | ICD-10-CM | POA: Diagnosis not present

## 2017-11-04 ENCOUNTER — Encounter: Payer: Self-pay | Admitting: Family Medicine

## 2017-11-17 ENCOUNTER — Other Ambulatory Visit: Payer: Self-pay | Admitting: Family Medicine

## 2017-11-20 NOTE — Telephone Encounter (Signed)
Ok to refill??  Last office visit 09/13/2017.  Last refill 09/18/2017, #2 refills.

## 2017-12-11 DIAGNOSIS — J029 Acute pharyngitis, unspecified: Secondary | ICD-10-CM | POA: Diagnosis not present

## 2017-12-11 DIAGNOSIS — J329 Chronic sinusitis, unspecified: Secondary | ICD-10-CM | POA: Diagnosis not present

## 2017-12-11 DIAGNOSIS — J069 Acute upper respiratory infection, unspecified: Secondary | ICD-10-CM | POA: Diagnosis not present

## 2017-12-16 ENCOUNTER — Encounter: Payer: Self-pay | Admitting: Family Medicine

## 2017-12-17 MED ORDER — CLONAZEPAM 1 MG PO TABS: ORAL_TABLET | ORAL | 0 refills | Status: DC

## 2017-12-17 NOTE — Telephone Encounter (Signed)
Requesting refill      LOV: 09/13/17  LRF:  11/20/17

## 2018-01-14 ENCOUNTER — Telehealth: Payer: Self-pay | Admitting: *Deleted

## 2018-01-14 ENCOUNTER — Encounter: Payer: Self-pay | Admitting: Family Medicine

## 2018-01-14 ENCOUNTER — Other Ambulatory Visit: Payer: Self-pay | Admitting: Family Medicine

## 2018-01-14 DIAGNOSIS — R918 Other nonspecific abnormal finding of lung field: Secondary | ICD-10-CM

## 2018-01-14 NOTE — Telephone Encounter (Signed)
-----   Message from [place] Six, LPN sent at 16/03/958 11:09 AM EST ----- Regarding: Order CT Chest Notes recorded by Salley Scarlet, MD on 01/26/2017    Previous left lower lung nodules are GONE She still has 1 stable right sided lung nodule NO CANCER SEEN   Repeat CT of chest in 1 year, if stable nothing else needs to be done

## 2018-01-14 NOTE — Telephone Encounter (Signed)
Ok to refill??  Last office visit 09/13/2017.  Last refill 12/17/2017.

## 2018-01-15 MED ORDER — CLONAZEPAM 1 MG PO TABS: ORAL_TABLET | ORAL | 0 refills | Status: DC

## 2018-01-24 ENCOUNTER — Other Ambulatory Visit: Payer: Self-pay | Admitting: Family Medicine

## 2018-01-24 DIAGNOSIS — R918 Other nonspecific abnormal finding of lung field: Secondary | ICD-10-CM

## 2018-01-24 DIAGNOSIS — R911 Solitary pulmonary nodule: Secondary | ICD-10-CM

## 2018-02-05 ENCOUNTER — Ambulatory Visit (HOSPITAL_COMMUNITY)
Admission: RE | Admit: 2018-02-05 | Discharge: 2018-02-05 | Disposition: A | Discharge status: Home / Self Care | Payer: BLUE CROSS/BLUE SHIELD | Source: Ambulatory Visit | Attending: Family Medicine | Admitting: Family Medicine

## 2018-02-05 DIAGNOSIS — K760 Fatty (change of) liver, not elsewhere classified: Secondary | ICD-10-CM | POA: Diagnosis not present

## 2018-02-05 DIAGNOSIS — R911 Solitary pulmonary nodule: Secondary | ICD-10-CM

## 2018-02-05 DIAGNOSIS — R918 Other nonspecific abnormal finding of lung field: Secondary | ICD-10-CM

## 2018-02-12 ENCOUNTER — Encounter: Payer: Self-pay | Admitting: Family Medicine

## 2018-02-12 MED ORDER — CLONAZEPAM 1 MG PO TABS: ORAL_TABLET | ORAL | 0 refills | Status: DC

## 2018-02-12 NOTE — Telephone Encounter (Signed)
Requesting refill   Klonopin   LOV: 09/13/17  LRF:  01/15/18

## 2018-02-12 NOTE — Telephone Encounter (Signed)
Needs OV.  

## 2018-02-24 ENCOUNTER — Encounter: Payer: Self-pay | Admitting: Family Medicine

## 2018-02-25 MED ORDER — SERTRALINE HCL 100 MG PO TABS: 150.0000 mg | ORAL_TABLET | Freq: Every day | ORAL | 0 refills | Status: DC

## 2018-03-04 ENCOUNTER — Encounter: Payer: Self-pay | Admitting: Family Medicine

## 2018-03-04 NOTE — Telephone Encounter (Signed)
Ok to refill??  Last office visit 01/24/2018.  Last refill 10/12/2017, #3 refills

## 2018-03-05 MED ORDER — ZOLPIDEM TARTRATE 10 MG PO TABS: 10.0000 mg | ORAL_TABLET | Freq: Every evening | ORAL | 3 refills | Status: DC | PRN

## 2018-03-24 ENCOUNTER — Encounter: Payer: Self-pay | Admitting: Family Medicine

## 2018-03-25 MED ORDER — CLONAZEPAM 1 MG PO TABS: ORAL_TABLET | ORAL | 0 refills | Status: DC

## 2018-03-25 NOTE — Telephone Encounter (Signed)
Ok to refill??  Last office visit 09/13/2017.  Last refill 02/12/2018.

## 2018-04-01 ENCOUNTER — Other Ambulatory Visit: Payer: Self-pay

## 2018-04-01 ENCOUNTER — Ambulatory Visit: Payer: PRIVATE HEALTH INSURANCE | Admitting: Family Medicine

## 2018-04-01 ENCOUNTER — Encounter: Payer: Self-pay | Admitting: Family Medicine

## 2018-04-01 DIAGNOSIS — E781 Pure hyperglyceridemia: Secondary | ICD-10-CM | POA: Diagnosis not present

## 2018-04-01 DIAGNOSIS — R7303 Prediabetes: Secondary | ICD-10-CM | POA: Diagnosis not present

## 2018-04-01 DIAGNOSIS — F331 Major depressive disorder, recurrent, moderate: Secondary | ICD-10-CM | POA: Diagnosis not present

## 2018-04-01 DIAGNOSIS — K760 Fatty (change of) liver, not elsewhere classified: Secondary | ICD-10-CM

## 2018-04-01 MED ORDER — ZOLPIDEM TARTRATE 10 MG PO TABS: 10.0000 mg | ORAL_TABLET | Freq: Every evening | ORAL | 1 refills | Status: DC | PRN

## 2018-04-01 MED ORDER — SERTRALINE HCL 100 MG PO TABS: 150.0000 mg | ORAL_TABLET | Freq: Every day | ORAL | 2 refills | Status: DC

## 2018-04-01 NOTE — Assessment & Plan Note (Signed)
Check LFT and lipid panel

## 2018-04-01 NOTE — Progress Notes (Signed)
   Subjective:    Patient ID: Jenna Love, female    DOB: [birth year], 58 y.o.   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 Not using klonopin during the day    No new concerns    Obesity- intermittant fasting, she eats between 6am-2pm, started 2 months ago, was at 286lbsm has not started , using [employer]    Had egg muffin  Today     Review Of Systems:  GEN- denies fatigue, fever, weight loss,weakness, recent illness HEENT- denies eye drainage, change in vision, nasal discharge, CVS- denies chest pain, palpitations RESP- denies SOB, cough, wheeze ABD- denies N/V, change in stools, abd pain GU- denies dysuria, hematuria, dribbling, incontinence MSK- denies joint pain, muscle aches, injury Neuro- denies headache, dizziness, syncope, seizure activity       Objective:    BP 122/64   Pulse 80   Temp 98.4 F (36.9 C) (Oral)   Resp 16   Ht 5\' 3"  (1.6 m)   Wt 278 lb (126.1 kg)   LMP 05/25/2014   SpO2 97%   BMI 49.25 kg/m  GEN- NAD, alert and oriented x3 HEENT- PERRL, EOMI, non injected sclera, pink conjunctiva, MMM, oropharynx clear Neck- Supple, no thyromegaly CVS- RRR, no murmur RESP-CTAB ABD-NABS,soft,NT,ND Psych- normal affect and mood  EXT- trace ankle edema Pulses- Radial, DP- 2+        Assessment & Plan:      Problem List Items Addressed This Visit      Unprioritized   Borderline diabetes   Relevant Orders   CBC with Differential/Platelet (Completed)   Hemoglobin A1c   Fatty liver disease, nonalcoholic    Check LFT and lipid panel      Hypertriglyceridemia    Continue to work on dietary changes, weight loss      Relevant Orders   Comprehensive  metabolic panel   CBC with Differential/Platelet (Completed)   Lipid panel   MDD (major depressive disorder) (HCC)    Continue effexor and ambien for sleep Klonopin only at bedtime now      Relevant Medications   sertraline (ZOLOFT) 100 MG tablet   Morbid obesity (HCC) - Primary      Note: This dictation was prepared with Dragon dictation along with smaller phrase technology. Any transcriptional errors that result from this process are unintentional.

## 2018-04-01 NOTE — Patient Instructions (Signed)
F/U 6 months

## 2018-04-01 NOTE — Assessment & Plan Note (Signed)
Continue effexor and ambien for sleep Klonopin only at bedtime now

## 2018-04-01 NOTE — Assessment & Plan Note (Signed)
Continue to work on dietary changes, weight loss

## 2018-04-02 LAB — COMPREHENSIVE METABOLIC PANEL
AG Ratio: 1.4 (calc) (ref 1.0–2.5)
ALKALINE PHOSPHATASE (APISO): 32 U/L — AB (ref 33–130)
ALT: 20 U/L (ref 6–29)
AST: 21 U/L (ref 10–35)
Albumin: 4.4 g/dL (ref 3.6–5.1)
BUN: 14 mg/dL (ref 7–25)
CHLORIDE: 105 mmol/L (ref 98–110)
CO2: 23 mmol/L (ref 20–32)
CREATININE: 0.77 mg/dL (ref 0.50–1.05)
Calcium: 9.3 mg/dL (ref 8.6–10.4)
GLOBULIN: 3.2 g/dL (ref 1.9–3.7)
GLUCOSE: 98 mg/dL (ref 65–99)
Potassium: 4.1 mmol/L (ref 3.5–5.3)
Sodium: 138 mmol/L (ref 135–146)
Total Bilirubin: 0.8 mg/dL (ref 0.2–1.2)
Total Protein: 7.6 g/dL (ref 6.1–8.1)

## 2018-04-02 LAB — CBC WITH DIFFERENTIAL/PLATELET
Absolute Monocytes: 583 cells/uL (ref 200–950)
Basophils Absolute: 50 cells/uL (ref 0–200)
Basophils Relative: 0.8 %
Eosinophils Absolute: 291 cells/uL (ref 15–500)
Eosinophils Relative: 4.7 %
HCT: 42.2 % (ref 35.0–45.0)
Hemoglobin: 14.5 g/dL (ref 11.7–15.5)
Lymphs Abs: 2449 cells/uL (ref 850–3900)
MCH: 29.9 pg (ref 27.0–33.0)
MCHC: 34.4 g/dL (ref 32.0–36.0)
MCV: 87 fL (ref 80.0–100.0)
MONOS PCT: 9.4 %
MPV: 10.6 fL (ref 7.5–12.5)
Neutro Abs: 2827 cells/uL (ref 1500–7800)
Neutrophils Relative %: 45.6 %
Platelets: 270 10*3/uL (ref 140–400)
RBC: 4.85 10*6/uL (ref 3.80–5.10)
RDW: 13.1 % (ref 11.0–15.0)
Total Lymphocyte: 39.5 %
WBC: 6.2 10*3/uL (ref 3.8–10.8)

## 2018-04-02 LAB — HEMOGLOBIN A1C
Hgb A1c MFr Bld: 5.7 % of total Hgb — ABNORMAL HIGH (ref <5.7)
Mean Plasma Glucose: 117 (calc)
eAG (mmol/L): 6.5 (calc)

## 2018-04-02 LAB — LIPID PANEL
Cholesterol: 160 mg/dL (ref <200)
HDL: 56 mg/dL (ref >50)
LDL Cholesterol (Calc): 82 mg/dL (calc)
NON-HDL CHOLESTEROL (CALC): 104 mg/dL (ref <130)
Total CHOL/HDL Ratio: 2.9 (calc) (ref <5.0)
Triglycerides: 122 mg/dL (ref <150)

## 2018-04-04 ENCOUNTER — Encounter: Payer: Self-pay | Admitting: *Deleted

## 2018-05-02 ENCOUNTER — Encounter: Payer: Self-pay | Admitting: Family Medicine

## 2018-05-28 ENCOUNTER — Encounter: Payer: Self-pay | Admitting: Family Medicine

## 2018-05-28 ENCOUNTER — Other Ambulatory Visit: Payer: Self-pay | Admitting: Family Medicine

## 2018-05-29 ENCOUNTER — Other Ambulatory Visit: Payer: Self-pay | Admitting: Family Medicine

## 2018-05-29 NOTE — Telephone Encounter (Signed)
Ok to refill??  Last office visit 04/01/2018.  Last refill 03/25/2018.

## 2018-07-02 DIAGNOSIS — L281 Prurigo nodularis: Secondary | ICD-10-CM | POA: Diagnosis not present

## 2018-07-16 NOTE — Progress Notes (Signed)
REVIEWED-NO ADDITIONAL RECOMMENDATIONS. 

## 2018-07-28 ENCOUNTER — Encounter: Payer: Self-pay | Admitting: Family Medicine

## 2018-07-29 MED ORDER — CLONAZEPAM 1 MG PO TABS: 1.0000 mg | ORAL_TABLET | Freq: Three times a day (TID) | ORAL | 1 refills | Status: DC | PRN

## 2018-07-29 NOTE — Telephone Encounter (Signed)
Ok to refill??  Last office visit 04/01/2018.  Last refill 05/29/2018, #1 refills.

## 2018-09-24 ENCOUNTER — Other Ambulatory Visit: Payer: Self-pay | Admitting: Family Medicine

## 2018-10-01 ENCOUNTER — Other Ambulatory Visit: Payer: Self-pay

## 2018-10-01 ENCOUNTER — Ambulatory Visit: Payer: BLUE CROSS/BLUE SHIELD | Admitting: Family Medicine

## 2018-10-01 ENCOUNTER — Encounter: Payer: Self-pay | Admitting: Family Medicine

## 2018-10-01 VITALS — BP 128/64 | HR 76 | Temp 98.5°F | Resp 14 | Ht 63.0 in | Wt 276.0 lb

## 2018-10-01 DIAGNOSIS — R609 Edema, unspecified: Secondary | ICD-10-CM

## 2018-10-01 DIAGNOSIS — R7303 Prediabetes: Secondary | ICD-10-CM | POA: Diagnosis not present

## 2018-10-01 DIAGNOSIS — K760 Fatty (change of) liver, not elsewhere classified: Secondary | ICD-10-CM | POA: Diagnosis not present

## 2018-10-01 MED ORDER — CLONAZEPAM 1 MG PO TABS: 1.0000 mg | ORAL_TABLET | Freq: Three times a day (TID) | ORAL | 1 refills | Status: DC | PRN

## 2018-10-01 MED ORDER — VICTOZA 18 MG/3ML ~~LOC~~ SOPN: PEN_INJECTOR | SUBCUTANEOUS | 3 refills | Status: DC

## 2018-10-01 MED ORDER — FUROSEMIDE 20 MG PO TABS: ORAL_TABLET | ORAL | 1 refills | Status: DC

## 2018-10-01 NOTE — Patient Instructions (Addendum)
Start Victoza   Week 1: 0.6mg  injected daily:   Week 2:  1.2 mg injected daily  Week 3:  1.8mg  injected daily F/U 6 weeks for weight

## 2018-10-01 NOTE — Progress Notes (Signed)
    Subjective:    Patient ID: Jenna Love, female    DOB: [birth year], 58 y.o.   MRN: [id number]  Patient presents for BLE Edema (ran out of Lasix )   She has had fluid d up off and on ran out oflsix, still taking   no pain in feet   no change in breathing  Fluid is much better now  She has not been as active working from home, sits at desk most of day   Obesity- went to bariatic clinic they started on HCG shots, wanted to start oral med she declined due to SE in past with stimulants, interested in other weight loss meds, as she is concerned about SE of the HCG    Needs klonopin refilled    Review Of Systems:  GEN- denies fatigue, fever, weight loss,weakness, recent illness HEENT- denies eye drainage, change in vision, nasal discharge, CVS- denies chest pain, palpitations RESP- denies SOB, cough, wheeze ABD- denies N/V, change in stools, abd pain GU- denies dysuria, hematuria, dribbling, incontinence MSK- denies joint pain, muscle aches, injury Neuro- denies headache, dizziness, syncope, seizure activity       Objective:    BP 128/64   Pulse 76   Temp 98.5 F (36.9 C) (Oral)   Resp 14   Ht 5\' 3"  (1.6 m)   Wt 276 lb (125.2 kg)   LMP 05/25/2014   SpO2 98%   BMI 48.89 kg/m  GEN- NAD, alert and oriented x3 HEENT- PERRL, EOMI, non injected sclera, pink conjunctiva, MMM, oropharynx clear Neck- Supple, no thyromegaly, no JVD CVS- RRR, no murmur RESP-CTAB ABD-NABS,soft,NT,ND EXT- mild pedal edema Pulses- Radial, DP- 2+        Assessment & Plan:      Problem List Items Addressed This Visit      Unprioritized   Borderline diabetes - Primary   Relevant Orders   CBC with Differential/Platelet (Completed)   Comprehensive metabolic panel (Completed)   Hemoglobin A1c (Completed)   Fatty liver disease, nonalcoholic   Morbid obesity ([name])    She has known borderline DM, fatty liver, high risk for CAD Would benefit from weight loss Will try her on Victoza  due to blood sugars Insurance does not covere Saxends Shown how to use medication She will D/C HCG shots      Relevant Medications   liraglutide (VICTOZA) 18 MG/3ML SOPN   Peripheral edema    Chronic peripheral edema worsened by her obesity, venous pooling Continue lasix, elevate feet during day, also get up to walk          Note: This dictation was prepared with Dragon dictation along with smaller phrase technology. Any transcriptional errors that result from this process are unintentional.

## 2018-10-02 ENCOUNTER — Encounter: Payer: Self-pay | Admitting: Family Medicine

## 2018-10-02 LAB — COMPREHENSIVE METABOLIC PANEL
AG Ratio: 1.2 (calc) (ref 1.0–2.5)
ALT: 24 U/L (ref 6–29)
AST: 25 U/L (ref 10–35)
Albumin: 4.2 g/dL (ref 3.6–5.1)
Alkaline phosphatase (APISO): 28 U/L — ABNORMAL LOW (ref 37–153)
BUN: 19 mg/dL (ref 7–25)
CO2: 27 mmol/L (ref 20–32)
Calcium: 9.2 mg/dL (ref 8.6–10.4)
Chloride: 104 mmol/L (ref 98–110)
Creat: 0.76 mg/dL (ref 0.50–1.05)
Globulin: 3.4 g/dL (calc) (ref 1.9–3.7)
Glucose, Bld: 90 mg/dL (ref 65–99)
Potassium: 4 mmol/L (ref 3.5–5.3)
Sodium: 138 mmol/L (ref 135–146)
Total Bilirubin: 0.6 mg/dL (ref 0.2–1.2)
Total Protein: 7.6 g/dL (ref 6.1–8.1)

## 2018-10-02 LAB — CBC WITH DIFFERENTIAL/PLATELET
Absolute Monocytes: 548 cells/uL (ref 200–950)
Basophils Absolute: 32 cells/uL (ref 0–200)
Basophils Relative: 0.5 %
Eosinophils Absolute: 302 cells/uL (ref 15–500)
Eosinophils Relative: 4.8 %
HCT: 42.6 % (ref 35.0–45.0)
Hemoglobin: 14.1 g/dL (ref 11.7–15.5)
Lymphs Abs: 2186 cells/uL (ref 850–3900)
MCH: 29.4 pg (ref 27.0–33.0)
MCHC: 33.1 g/dL (ref 32.0–36.0)
MCV: 88.8 fL (ref 80.0–100.0)
MPV: 10.6 fL (ref 7.5–12.5)
Monocytes Relative: 8.7 %
Neutro Abs: 3232 cells/uL (ref 1500–7800)
Neutrophils Relative %: 51.3 %
Platelets: 249 10*3/uL (ref 140–400)
RBC: 4.8 10*6/uL (ref 3.80–5.10)
RDW: 13 % (ref 11.0–15.0)
Total Lymphocyte: 34.7 %
WBC: 6.3 10*3/uL (ref 3.8–10.8)

## 2018-10-02 LAB — HEMOGLOBIN A1C
Hgb A1c MFr Bld: 5.6 % of total Hgb (ref <5.7)
Mean Plasma Glucose: 114 (calc)
eAG (mmol/L): 6.3 (calc)

## 2018-10-02 NOTE — Assessment & Plan Note (Signed)
Chronic peripheral edema worsened by her obesity, venous pooling Continue lasix, elevate feet during day, also get up to walk

## 2018-10-02 NOTE — Assessment & Plan Note (Signed)
She has known borderline DM, fatty liver, high risk for CAD Would benefit from weight loss Will try her on Victoza due to blood sugars Insurance does not covere Saxends Shown how to use medication She will D/C HCG shots

## 2018-10-22 ENCOUNTER — Other Ambulatory Visit (HOSPITAL_COMMUNITY)
Admission: RE | Admit: 2018-10-22 | Discharge: 2018-10-22 | Disposition: A | Discharge status: Home / Self Care | Payer: BC Managed Care – PPO | Source: Ambulatory Visit | Attending: Adult Health | Admitting: Adult Health

## 2018-10-22 ENCOUNTER — Other Ambulatory Visit: Payer: Self-pay

## 2018-10-22 ENCOUNTER — Encounter: Payer: Self-pay | Admitting: Adult Health

## 2018-10-22 ENCOUNTER — Ambulatory Visit (INDEPENDENT_AMBULATORY_CARE_PROVIDER_SITE_OTHER): Payer: BC Managed Care – PPO | Admitting: Adult Health

## 2018-10-22 VITALS — BP 140/93 | HR 90 | Ht 62.5 in | Wt 273.5 lb

## 2018-10-22 DIAGNOSIS — R319 Hematuria, unspecified: Secondary | ICD-10-CM | POA: Diagnosis not present

## 2018-10-22 DIAGNOSIS — R8290 Unspecified abnormal findings in urine: Secondary | ICD-10-CM | POA: Diagnosis not present

## 2018-10-22 DIAGNOSIS — R238 Other skin changes: Secondary | ICD-10-CM | POA: Insufficient documentation

## 2018-10-22 DIAGNOSIS — Z1211 Encounter for screening for malignant neoplasm of colon: Secondary | ICD-10-CM | POA: Diagnosis not present

## 2018-10-22 DIAGNOSIS — Z01419 Encounter for gynecological examination (general) (routine) without abnormal findings: Secondary | ICD-10-CM

## 2018-10-22 DIAGNOSIS — Z1212 Encounter for screening for malignant neoplasm of rectum: Secondary | ICD-10-CM

## 2018-10-22 LAB — POCT URINALYSIS DIPSTICK
Glucose, UA: NEGATIVE
Ketones, UA: NEGATIVE
Nitrite, UA: NEGATIVE
Protein, UA: NEGATIVE

## 2018-10-22 LAB — HEMOCCULT GUIAC POC 1CARD (OFFICE): Fecal Occult Blood, POC: NEGATIVE

## 2018-10-22 MED ORDER — VALACYCLOVIR HCL 1 G PO TABS: 1000.0000 mg | ORAL_TABLET | Freq: Two times a day (BID) | ORAL | 0 refills | Status: DC

## 2018-10-22 NOTE — Progress Notes (Signed)
Patient ID: Jenna Love, female   DOB: 1960/05/04, 58 y.o.   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 She is complaining of stomach pain after eating for about a month, mild nausea, no vomiting or diarrhea, mild constipation at times, started victoza recently for weight loss. And has rash right groin area, has seen dermatologist twice now, given steroid cream, no better.And urine has strong odor.  PCP is Dr Buelah Manis.   Current Medications, Allergies, Past Medical History, Past Surgical History, Family History and Social History were reviewed in [place] record.     Review of Systems: Patient denies any headaches, hearing loss, fatigue, blurred vision, shortness of breath, chest pain, problems with bowel movements, urination, or intercourse. No joint pain or mood swings.No  vaginal  bleeding.  See HPI for positives.    Physical Exam:BP (!) 140/93 (BP Location: Right Arm, Patient Position: Sitting, Cuff Size: Large)   Pulse 90   Ht 5' 2.5" (1.588 m)   Wt 273 lb 8 oz (124.1 kg)   LMP 05/25/2014   BMI 49.23 kg/m   Urine dipstick trace blood and trace leuks. General:  Well developed, well nourished, no acute distress Skin:  Warm and dry Neck:  Midline trachea, normal thyroid, good ROM, no lymphadenopathy Lungs; Clear to auscultation bilaterally Breast:  No dominant palpable mass, retraction, or nipple discharge Cardiovascular: Regular rate and rhythm Abdomen:  Soft, non tender, no hepatosplenomegaly,obese Pelvic:  External genitalia is normal in appearance, has area of vesicles right groin, HSV culture obtained, and then painted with gentian violet.  The vagina is normal in appearance for age with loss of color, moisture and rugae. Urethra has no lesions or masses. The cervix is bulbous, pap with HPV reflex 16/18 genotyping performed.Marland Kitchen  Uterus is felt to be normal size,  shape, and contour.  No adnexal masses or tenderness noted.Bladder is non tender, no masses felt. Rectal: Good sphincter tone, no polyps, or hemorrhoids felt.  Hemoccult negative. Extremities/musculoskeletal:  No swelling or varicosities noted, no clubbing or cyanosis Psych:  No mood changes, alert and cooperative,seems happy Fall risk is low PHQ 2 score is 0 Examination chaperoned by Levy Pupa LPN.   Impression: 1. Encounter for gynecological examination with Papanicolaou smear of cervix   2. Abnormal urine odor   3. Hematuria, unspecified type   4. Vesicles   5. Screening for colorectal cancer       Plan: Pap with HPV 16/18 reflex genotyping sent UA C&S sent, push water Discussed with her could be herpes but may not be HSV culture sent and will rx valtrex Meds ordered this encounter  Medications  . valACYclovir (VALTREX) 1000 MG tablet    Sig: Take 1 tablet (1,000 mg total) by mouth 2 (two) times daily.    Dispense:  20 tablet    Refill:  0    Order Specific Question:   Supervising Provider    Answer:   Tania Ade H [[id number]]   Follow up in 2 weeks to recheck groin, if better can cancel Physical in 1 year Pap in 3 if normal  mammogram yearly Colonoscopy per GI If stomach continues to hurt call Dr [facility] with Dr Buelah Manis

## 2018-10-23 LAB — URINALYSIS, ROUTINE W REFLEX MICROSCOPIC
Bilirubin, UA: NEGATIVE
Glucose, UA: NEGATIVE
Ketones, UA: NEGATIVE
Leukocytes,UA: NEGATIVE
Nitrite, UA: POSITIVE — AB
Protein,UA: NEGATIVE
RBC, UA: NEGATIVE
Specific Gravity, UA: 1.007 (ref 1.005–1.030)
Urobilinogen, Ur: 0.2 mg/dL (ref 0.2–1.0)
pH, UA: 7 (ref 5.0–7.5)

## 2018-10-23 LAB — SPECIMEN STATUS REPORT

## 2018-10-23 LAB — MICROSCOPIC EXAMINATION
Casts: NONE SEEN /lpf
RBC, Urine: NONE SEEN /hpf (ref 0–2)

## 2018-10-24 ENCOUNTER — Telehealth: Payer: Self-pay | Admitting: Adult Health

## 2018-10-24 LAB — URINE CULTURE

## 2018-10-24 LAB — CYTOLOGY - PAP
Diagnosis: NEGATIVE
HPV: NOT DETECTED

## 2018-10-24 LAB — SPECIMEN STATUS REPORT

## 2018-10-24 MED ORDER — SULFAMETHOXAZOLE-TRIMETHOPRIM 800-160 MG PO TABS: 1.0000 | ORAL_TABLET | Freq: Two times a day (BID) | ORAL | 0 refills | Status: DC

## 2018-10-24 NOTE — Telephone Encounter (Signed)
Left message for her to check my chart. Urine  Culture +E coli, rx septra ds to [place]

## 2018-10-25 LAB — HERPES SIMPLEX VIRUS CULTURE

## 2018-10-28 ENCOUNTER — Telehealth: Payer: Self-pay | Admitting: Adult Health

## 2018-10-28 NOTE — Telephone Encounter (Signed)
Patient called stating that she tried looking at her mychart for her lab results but could not understand them. Please contact pt with Lab results.

## 2018-10-28 NOTE — Telephone Encounter (Signed)
Pt requesting a call to discuss the e-coli found on her pap. She states that she is not feeling well today and wondering if this can come from that.

## 2018-10-28 NOTE — Telephone Encounter (Signed)
It is not unusual so she should continue the bactrim DS til finished, she should start to feeling better over the next few days

## 2018-10-28 NOTE — Telephone Encounter (Signed)
Patient was put on Bactrim for e coli uti on 8/6. She is having low back pain, and dysuria. Also just not feeling good. She wanted to know if she needed something else or if this was normal.

## 2018-11-09 ENCOUNTER — Telehealth: Payer: BC Managed Care – PPO | Admitting: Nurse Practitioner

## 2018-11-09 DIAGNOSIS — K591 Functional diarrhea: Secondary | ICD-10-CM | POA: Diagnosis not present

## 2018-11-09 DIAGNOSIS — R112 Nausea with vomiting, unspecified: Secondary | ICD-10-CM | POA: Diagnosis not present

## 2018-11-09 MED ORDER — ONDANSETRON HCL 4 MG PO TABS: 4.0000 mg | ORAL_TABLET | Freq: Three times a day (TID) | ORAL | 0 refills | Status: DC | PRN

## 2018-11-09 NOTE — Progress Notes (Signed)
We are sorry that you are not feeling well.  Here is how we plan to help!  Based on what you have shared with me it looks like you have Acute Infectious Diarrhea.  Most cases of acute diarrhea are due to infections with virus and bacteria and are self-limited conditions lasting less than 14 days.  For your symptoms you may take Imodium 2 mg tablets that are over the counter at your local pharmacy. Take two tablet now and then one after each loose stool up to 6 a day.  Antibiotics are not needed for most people with diarrhea.  Optional: Zofran 4 mg 1 tablet every 8 hours as needed for nausea and vomiting  HOME CARE  We recommend changing your diet to help with your symptoms for the next few days.  Drink plenty of fluids that contain water salt and sugar. Sports drinks such as Gatorade may help.   You may try broths, soups, bananas, applesauce, soft breads, mashed potatoes or crackers.   You are considered infectious for as long as the diarrhea continues. Hand washing or use of alcohol based hand sanitizers is recommend.  It is best to stay out of work or school until your symptoms stop.   GET HELP RIGHT AWAY  If you have dark yellow colored urine or do not pass urine frequently you should drink more fluids.    If your symptoms worsen   If you feel like you are going to pass out (faint)  You have a new problem  MAKE SURE YOU   Understand these instructions.  Will watch your condition.  Will get help right away if you are not doing well or get worse.  Your e-visit answers were reviewed by a board certified advanced clinical practitioner to complete your personal care plan.  Depending on the condition, your plan could have included both over the counter or prescription medications.  If there is a problem please reply  once you have received a response from your provider.  Your safety is important to us.  If you have drug allergies check your prescription carefully.    You  can use MyChart to ask questions about today's visit, request a non-urgent call back, or ask for a work or school excuse for 24 hours related to this e-Visit. If it has been greater than 24 hours you will need to follow up with your provider, or enter a new e-Visit to address those concerns.   You will get an e-mail in the next two days asking about your experience.  I hope that your e-visit has been valuable and will speed your recovery. Thank you for using e-visits.  5-10 minutes spent reviewing and documenting in chart.  

## 2018-11-10 ENCOUNTER — Encounter: Payer: Self-pay | Admitting: Family Medicine

## 2018-11-11 ENCOUNTER — Other Ambulatory Visit: Payer: Self-pay

## 2018-11-11 ENCOUNTER — Encounter: Payer: Self-pay | Admitting: *Deleted

## 2018-11-11 ENCOUNTER — Ambulatory Visit (HOSPITAL_COMMUNITY)
Admission: RE | Admit: 2018-11-11 | Discharge: 2018-11-11 | Disposition: A | Discharge status: Home / Self Care | Payer: BC Managed Care – PPO | Source: Ambulatory Visit | Attending: Family Medicine | Admitting: Family Medicine

## 2018-11-11 ENCOUNTER — Telehealth: Payer: Self-pay | Admitting: Family Medicine

## 2018-11-11 ENCOUNTER — Ambulatory Visit: Payer: BC Managed Care – PPO | Admitting: Family Medicine

## 2018-11-11 ENCOUNTER — Ambulatory Visit (HOSPITAL_COMMUNITY)
Admission: RE | Admit: 2018-11-11 | Discharge: 2018-11-11 | Disposition: A | Discharge status: Home / Self Care | Payer: BC Managed Care – PPO | Source: Ambulatory Visit

## 2018-11-11 ENCOUNTER — Encounter: Payer: Self-pay | Admitting: Family Medicine

## 2018-11-11 VITALS — BP 132/62 | HR 100 | Temp 97.9°F | Resp 14 | Ht 62.5 in | Wt 274.0 lb

## 2018-11-11 DIAGNOSIS — R918 Other nonspecific abnormal finding of lung field: Secondary | ICD-10-CM | POA: Diagnosis not present

## 2018-11-11 DIAGNOSIS — N390 Urinary tract infection, site not specified: Secondary | ICD-10-CM | POA: Insufficient documentation

## 2018-11-11 DIAGNOSIS — B9620 Unspecified Escherichia coli [E. coli] as the cause of diseases classified elsewhere: Secondary | ICD-10-CM

## 2018-11-11 DIAGNOSIS — R1110 Vomiting, unspecified: Secondary | ICD-10-CM | POA: Diagnosis not present

## 2018-11-11 DIAGNOSIS — R109 Unspecified abdominal pain: Secondary | ICD-10-CM | POA: Diagnosis not present

## 2018-11-11 DIAGNOSIS — R197 Diarrhea, unspecified: Secondary | ICD-10-CM | POA: Insufficient documentation

## 2018-11-11 LAB — COMPREHENSIVE METABOLIC PANEL
AG Ratio: 1.3 (calc) (ref 1.0–2.5)
ALT: 19 U/L (ref 6–29)
AST: 18 U/L (ref 10–35)
Albumin: 3.9 g/dL (ref 3.6–5.1)
Alkaline phosphatase (APISO): 19 U/L — ABNORMAL LOW (ref 37–153)
BUN: 17 mg/dL (ref 7–25)
CO2: 26 mmol/L (ref 20–32)
Calcium: 8.8 mg/dL (ref 8.6–10.4)
Chloride: 108 mmol/L (ref 98–110)
Creat: 0.91 mg/dL (ref 0.50–1.05)
Globulin: 3.1 g/dL (calc) (ref 1.9–3.7)
Glucose, Bld: 146 mg/dL — ABNORMAL HIGH (ref 65–99)
Potassium: 3.8 mmol/L (ref 3.5–5.3)
Sodium: 140 mmol/L (ref 135–146)
Total Bilirubin: 0.8 mg/dL (ref 0.2–1.2)
Total Protein: 7 g/dL (ref 6.1–8.1)

## 2018-11-11 LAB — URINALYSIS, ROUTINE W REFLEX MICROSCOPIC
Bilirubin Urine: NEGATIVE
Glucose, UA: NEGATIVE
Hyaline Cast: NONE SEEN /LPF
Ketones, ur: NEGATIVE
Leukocytes,Ua: NEGATIVE
Nitrite: NEGATIVE
Protein, ur: NEGATIVE
Specific Gravity, Urine: 1.01 (ref 1.001–1.03)
pH: 5.5 (ref 5.0–8.0)

## 2018-11-11 LAB — CBC WITH DIFFERENTIAL/PLATELET
Absolute Monocytes: 435 cells/uL (ref 200–950)
Basophils Absolute: 11 cells/uL (ref 0–200)
Basophils Relative: 0.2 %
Eosinophils Absolute: 424 cells/uL (ref 15–500)
Eosinophils Relative: 7.7 %
HCT: 38.4 % (ref 35.0–45.0)
Hemoglobin: 13 g/dL (ref 11.7–15.5)
Lymphs Abs: 1843 cells/uL (ref 850–3900)
MCH: 30.4 pg (ref 27.0–33.0)
MCHC: 33.9 g/dL (ref 32.0–36.0)
MCV: 89.9 fL (ref 80.0–100.0)
MPV: 10.1 fL (ref 7.5–12.5)
Monocytes Relative: 7.9 %
Neutro Abs: 2789 cells/uL (ref 1500–7800)
Neutrophils Relative %: 50.7 %
Platelets: 230 10*3/uL (ref 140–400)
RBC: 4.27 10*6/uL (ref 3.80–5.10)
RDW: 13.6 % (ref 11.0–15.0)
Total Lymphocyte: 33.5 %
WBC: 5.5 10*3/uL (ref 3.8–10.8)

## 2018-11-11 LAB — LIPASE: Lipase: 79 U/L — ABNORMAL HIGH (ref 7–60)

## 2018-11-11 LAB — MICROSCOPIC MESSAGE

## 2018-11-11 MED ORDER — IOHEXOL 300 MG/ML  SOLN
100.0000 mL | Freq: Once | INTRAMUSCULAR | Status: AC | PRN
  Administered 2018-11-11: 14:00:00 100 mL via INTRAVENOUS

## 2018-11-11 NOTE — Patient Instructions (Signed)
We will call with lab results and CT scan results  F/U pending results

## 2018-11-11 NOTE — Progress Notes (Signed)
   Subjective:    Patient ID: Jenna Love, female    DOB: 1961/01/16, 58 y.o.   MRN: [id number]  Patient presents for Abdominal Pain (had UTI with E-Coli at last GYN visit, has diarrhea and pain to L side/ back pain)  Pt here with abd pain for the past 4-5 days, started Friday with severe pain into left side, points to LUQ and flank, now radiating all over left side, had N/V as well. After any meals would have vomiting or diarrhea.     Note seen by GYN treated for UTI on 8/4 with bactrim She did e visit fot diarrhea on 8/22 advised infectious diarrhea also N/V    Had chills, given zofran which stopped vomiting, last taken last night , able to sip fluids     No known sick illness        Colonoscopy in 2016 no diverticular disease    No current UTI symptoms , denies blood in stools        Review Of Systems:  GEN- denies fatigue, fever, weight loss,+weakness, recent illness HEENT- denies eye drainage, change in vision, nasal discharge, CVS- denies chest pain, palpitations RESP- denies SOB, cough, wheeze ABD- + N/V,+ change in stools,+ abd pain GU- denies dysuria, hematuria, dribbling, incontinence MSK- denies joint pain, muscle aches, injury Neuro- denies headache, dizziness, syncope, seizure activity       Objective:    BP 132/62   Pulse 100   Temp 97.9 F (36.6 C) (Oral)   Resp 14   Ht 5' 2.5" (1.588 m)   Wt 274 lb (124.3 kg)   LMP 05/25/2014   SpO2 96%   BMI 49.32 kg/m  GEN- NAD, alert and oriented x3 HEENT- PERRL, EOMI, non injected sclera, pink conjunctiva, MMM, oropharynx clear Neck- Supple, no thyromegaly,no LAD  CVS- RRR, no murmur RESP-CTAB ABD-NABS,soft,TTP LLQ, LUQ, Left flank, epigastric region   EXT- No edema Pulses- Radial, 2+        Assessment & Plan:      Problem List Items Addressed This Visit    None    Visit Diagnoses    Left sided abdominal pain    -  Primary   DD, diverticulitis or other colitis, pylonephritis in setting of  recent e coli UTI, obtain CT abd pelvis, continue zofran for N/V, treatment pending results    Relevant Orders   CT Abdomen Pelvis W Contrast   CBC with Differential/Platelet   Comprehensive metabolic panel   Lipase   E. coli UTI       Relevant Orders   Urinalysis, Routine w reflex microscopic   Urine Culture   CT Abdomen Pelvis W Contrast   Diarrhea, unspecified type       Relevant Orders   CT Abdomen Pelvis W Contrast      Note: This dictation was prepared with Dragon dictation along with smaller phrase technology. Any transcriptional errors that result from this process are unintentional.

## 2018-11-11 NOTE — Telephone Encounter (Signed)
Patient is requesting a note for work from her visit today.

## 2018-11-12 ENCOUNTER — Encounter: Payer: Self-pay | Admitting: Family Medicine

## 2018-11-12 ENCOUNTER — Ambulatory Visit (HOSPITAL_COMMUNITY): Payer: BC Managed Care – PPO

## 2018-11-13 ENCOUNTER — Encounter: Payer: Self-pay | Admitting: Family Medicine

## 2018-11-13 LAB — URINE CULTURE
MICRO NUMBER:: 803383
SPECIMEN QUALITY:: ADEQUATE

## 2018-11-13 MED ORDER — CYCLOBENZAPRINE HCL 10 MG PO TABS: 10.0000 mg | ORAL_TABLET | Freq: Two times a day (BID) | ORAL | 0 refills | Status: DC | PRN

## 2018-11-21 ENCOUNTER — Encounter: Payer: Self-pay | Admitting: Family Medicine

## 2018-11-21 MED ORDER — ZOLPIDEM TARTRATE 10 MG PO TABS: 10.0000 mg | ORAL_TABLET | Freq: Every evening | ORAL | 1 refills | Status: DC | PRN

## 2018-11-21 NOTE — Telephone Encounter (Signed)
Last office visit: 11/11/2018 Last refilled: 04/01/2018

## 2018-12-02 ENCOUNTER — Encounter: Payer: Self-pay | Admitting: Family Medicine

## 2018-12-02 MED ORDER — CLONAZEPAM 1 MG PO TABS: 1.0000 mg | ORAL_TABLET | Freq: Three times a day (TID) | ORAL | 1 refills | Status: DC | PRN

## 2018-12-02 NOTE — Telephone Encounter (Signed)
Ok to refill??  Last office visit 11/11/2018.  Last refill 10/01/2018, #1 refill.

## 2019-01-03 ENCOUNTER — Other Ambulatory Visit: Payer: Self-pay | Admitting: Family Medicine

## 2019-01-06 NOTE — Telephone Encounter (Signed)
Ok to refill??  Last office visit 11/11/2018.  Last refill 12/02/2018, #1 refills.

## 2019-02-03 ENCOUNTER — Encounter: Payer: Self-pay | Admitting: Family Medicine

## 2019-02-14 ENCOUNTER — Encounter: Payer: Self-pay | Admitting: Family Medicine

## 2019-02-17 MED ORDER — ZOLPIDEM TARTRATE 10 MG PO TABS: 10.0000 mg | ORAL_TABLET | Freq: Every evening | ORAL | 1 refills | Status: DC | PRN

## 2019-02-17 NOTE — Telephone Encounter (Signed)
Ok to refill??  Last office visit 11/11/2018.  Last refill 11/21/2018, #1 refill.

## 2019-02-19 ENCOUNTER — Encounter: Payer: Self-pay | Admitting: Adult Health

## 2019-02-19 ENCOUNTER — Ambulatory Visit (INDEPENDENT_AMBULATORY_CARE_PROVIDER_SITE_OTHER): Payer: Managed Care, Other (non HMO) | Admitting: Adult Health

## 2019-02-19 ENCOUNTER — Other Ambulatory Visit: Payer: Self-pay

## 2019-02-19 VITALS — BP 127/68 | HR 88 | Ht 63.0 in | Wt 279.0 lb

## 2019-02-19 DIAGNOSIS — L900 Lichen sclerosus et atrophicus: Secondary | ICD-10-CM | POA: Insufficient documentation

## 2019-02-19 MED ORDER — CLOBETASOL PROP EMOLLIENT BASE 0.05 % EX CREA: TOPICAL_CREAM | CUTANEOUS | 1 refills | Status: DC

## 2019-02-19 NOTE — Progress Notes (Signed)
  Subjective:     Patient ID: Jenna Love, female   DOB: 1960/07/05, 58 y.o.   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  She has had for over a year and has seen Dr Nevada Crane in the past, with no relief.  PCP is Dr Buelah Manis.  Review of Systems Has area of itching right groin and rash and one spot left labia  Reviewed past medical,surgical, social and family history. Reviewed medications and allergies.     Objective:   Physical Exam BP 127/68 (BP Location: Left Arm, Patient Position: Sitting, Cuff Size: Large)   Pulse 88   Ht 5\' 3"  (1.6 m)   Wt 279 lb (126.6 kg)   LMP 05/25/2014   BMI 49.42 kg/m   External genitalia has thickened area left labia, and thin skin that is red and white with excoriation in right groin.  Will try temovate and recheck in about 3 weeks, may need punch biopsy.     Assessment:     1. Lichen sclerosus Will rx temovate Meds ordered this encounter  Medications  . Clobetasol Prop Emollient Base 0.05 % emollient cream    Sig: Apply bid to affected area for 2 weeks then once daily till appointment    Dispense:  30 g    Refill:  1    Order Specific Question:   Supervising Provider    Answer:   Florian Buff [[id number]]       Plan:     Follow up with me 03/11/2019 to recheck area, may need punch biopsy

## 2019-02-19 NOTE — Patient Instructions (Signed)
Lichen Sclerosus Lichen sclerosus is a skin problem. It can happen on any part of the body. It happens most often in the anal or genital areas. It can cause itching and discomfort. Treatment can help to control symptoms. It can also help prevent scarring that may lead to other problems. What are the causes? The cause of this condition is not known. It is not passed from one person to another (not contagious). What increases the risk? This condition is more likely to develop in women. It most often occurs after menopause. What are the signs or symptoms? Symptoms of this condition include:  Thin, wrinkled, white areas on the skin.  Thickened white areas on the skin.  Red and swollen patches (lesions) on the skin.  Tears or cracks in the skin.  Bruising.  Blood blisters.  Very bad itching.  Pain, itching, or burning when peeing (urinating).  Trouble pooping (constipation). How is this diagnosed? This condition may be diagnosed with a physical exam. A sample of your skin may also be removed to be looked at under a microscope (biopsy). How is this treated? This condition may be treated with:  Creams or ointments (topical steroids) that are put on the skin in the affected areas. This is the most common treatment.  Medicines that are taken by mouth.  Surgery. This is only needed if the condition is very bad and is causing problems such as scarring. Follow these instructions at home:  Take or use over-the-counter and prescription medicines only as told by your doctor.  Use creams or ointments as told by your doctor.  Do not scratch the affected areas of skin.  If you are a woman, keep the vagina as clean and dry as you can.  Clean the affected area of skin gently with water. Avoid using rough towels or toilet paper.  Keep all follow-up visits as told by your doctor. This is important. Contact a doctor if:  Your redness, swelling, or pain gets worse.  You have fluid,  blood, or pus coming from the area.  You have new red and swollen patches on your skin.  You have a fever.  You have pain during sex. Summary  Lichen sclerosus is a skin problem. It can cause itching and discomfort.  This condition is usually treated with creams or ointments that are put on the skin in the affected areas.  Use medicines only as told by your doctor.  Do not scratch the affected areas of skin.  Keep all follow-up visits as told by your doctor. This is important. This information is not intended to replace advice given to you by your health care provider. Make sure you discuss any questions you have with your health care provider. Document Released: 02/17/2008 Document Revised: 07/19/2017 Document Reviewed: 07/19/2017 Elsevier Patient Education  2020 Elsevier Inc.  

## 2019-03-11 ENCOUNTER — Ambulatory Visit: Payer: Managed Care, Other (non HMO) | Admitting: Adult Health

## 2019-04-09 ENCOUNTER — Encounter: Payer: Self-pay | Admitting: Family Medicine

## 2019-04-10 NOTE — Telephone Encounter (Signed)
Ok to refill??  Last office visit 11/11/2018.  Last refill 01/06/2019, #1 refill.

## 2019-04-11 ENCOUNTER — Other Ambulatory Visit: Payer: Self-pay | Admitting: Family Medicine

## 2019-04-11 ENCOUNTER — Encounter: Payer: Self-pay | Admitting: Family Medicine

## 2019-04-11 MED ORDER — CLONAZEPAM 1 MG PO TABS: ORAL_TABLET | ORAL | 1 refills | Status: DC

## 2019-04-11 NOTE — Telephone Encounter (Signed)
Requested Prescriptions   Pending Prescriptions Disp Refills  . clonazePAM (KLONOPIN) 1 MG tablet [Pharmacy Med Name: CLONAZEPAM 1 MG TABLET] 90 tablet 0    Sig: TAKE (1) TABLET BY MOUTH THREE TIMES DAILY AS NEEDED ANXIETY.     Last OV 11/11/2018   Last written 01/06/2019

## 2019-05-03 ENCOUNTER — Encounter: Payer: Self-pay | Admitting: Family Medicine

## 2019-05-03 ENCOUNTER — Other Ambulatory Visit: Payer: Self-pay | Admitting: Family Medicine

## 2019-05-05 MED ORDER — SERTRALINE HCL 100 MG PO TABS: 150.0000 mg | ORAL_TABLET | Freq: Every day | ORAL | 2 refills | Status: DC

## 2019-05-05 MED ORDER — ZOLPIDEM TARTRATE 10 MG PO TABS: 10.0000 mg | ORAL_TABLET | Freq: Every evening | ORAL | 1 refills | Status: DC | PRN

## 2019-05-05 NOTE — Telephone Encounter (Signed)
Ok to refill Ambien??   Last office visit 11/11/2018.  Last refill 02/17/2019, #1 refills.

## 2019-05-14 ENCOUNTER — Other Ambulatory Visit: Payer: Self-pay | Admitting: Family Medicine

## 2019-05-14 NOTE — Telephone Encounter (Signed)
Ok to refill??  Last office visit 11/11/2018.  Last refill 04/10/2019.

## 2019-06-10 ENCOUNTER — Ambulatory Visit (INDEPENDENT_AMBULATORY_CARE_PROVIDER_SITE_OTHER): Payer: Managed Care, Other (non HMO) | Admitting: Adult Health

## 2019-06-10 ENCOUNTER — Other Ambulatory Visit: Payer: Self-pay

## 2019-06-10 ENCOUNTER — Encounter: Payer: Self-pay | Admitting: Adult Health

## 2019-06-10 VITALS — BP 105/71 | HR 88 | Ht 63.0 in | Wt 272.5 lb

## 2019-06-10 DIAGNOSIS — L900 Lichen sclerosus et atrophicus: Secondary | ICD-10-CM

## 2019-06-10 DIAGNOSIS — R102 Pelvic and perineal pain: Secondary | ICD-10-CM | POA: Insufficient documentation

## 2019-06-10 MED ORDER — CLOBETASOL PROP EMOLLIENT BASE 0.05 % EX CREA: TOPICAL_CREAM | CUTANEOUS | 1 refills | Status: DC

## 2019-06-10 NOTE — Progress Notes (Signed)
  Subjective:     Patient ID: Jenna Love, female   DOB: [birth year], 59 y.o.   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 PCP is Dr Jeanice Lim.  Review of Systems  +cramping like period cram[s +pelvic pressure and pressure with BM which is normal Has noticed pelvic pain for about a month now Denies  any vaginal bleeding Has ?rash right groin, has Lichen sclerosis but stopped using temovate and did not come back for possible biopsy  Reviewed past medical,surgical, social and family history. Reviewed medications and allergies.     Objective:   Physical Exam BP 105/71 (BP Location: Left Arm, Patient Position: Sitting, Cuff Size: Large)   Pulse 88   Ht 5\' 3"  (1.6 m)   Wt 272 lb 8 oz (123.6 kg)   LMP 05/25/2014   BMI 48.27 kg/m fall risk is low Skin warm and dry.Pelvic: external genitalia, has thick skin left labia and in right groin the skin is thin with white areas and excoriation,   vagina: pale with loss of moisture and rugae,urethra has no lesions or masses noted, cervix:smooth, uterus: normal size, shape and contour, + tender, no masses felt, adnexa: no masses, R>L++ tenderness noted. Bladder is non tender and no masses felt. Examination chaperoned by 07/25/2014 LPN     Assessment:     1. Pelvic pressure in female Will get GYN Malachy Mood to assess ovaries and uterus tomorrow  2. Pelvic pain Will get GYN [place]  3. Suprapubic cramping Will get GYN [place]  4. Lichen sclerosus Start back using temovate,will Rx,again  Meds ordered this encounter  Medications  . Clobetasol Prop Emollient Base 0.05 % emollient cream    Sig: Apply bid to affected area for 2 weeks then once daily till  appointment    Dispense:  30 g    Refill:  1    Order Specific Question:   Supervising Provider    Answer:   [place] [[id number]]  Discussed again that LS is chronic and needs to use temovate, and that may need biopsy     Plan:     Will talk when Laz[facility] Arms results back and will see next week to reassess area right labia, may need punch biopsy

## 2019-06-11 ENCOUNTER — Other Ambulatory Visit: Payer: Self-pay

## 2019-06-11 ENCOUNTER — Ambulatory Visit (INDEPENDENT_AMBULATORY_CARE_PROVIDER_SITE_OTHER): Payer: Managed Care, Other (non HMO)

## 2019-06-11 ENCOUNTER — Encounter: Payer: Self-pay | Admitting: Family Medicine

## 2019-06-11 DIAGNOSIS — R102 Pelvic and perineal pain unspecified side: Secondary | ICD-10-CM

## 2019-06-11 DIAGNOSIS — R9389 Abnormal findings on diagnostic imaging of other specified body structures: Secondary | ICD-10-CM

## 2019-06-11 DIAGNOSIS — R1024 Suprapubic pain: Secondary | ICD-10-CM

## 2019-06-11 MED ORDER — CLONAZEPAM 1 MG PO TABS: ORAL_TABLET | ORAL | 1 refills | Status: DC

## 2019-06-11 NOTE — Telephone Encounter (Signed)
Ok to refill??  Last office visit 11/11/2018.  Last refill 05/14/2019, #1 refill.

## 2019-06-11 NOTE — Progress Notes (Signed)
PELVIC US TA/TV:heterogeneous anteverted uterus,complex nabothian cyst vs fibroid lower uterine segment 1.9 X 2 X 1.3 cm,heterogeneous thickened endometrium 14.3 mm,unable to visualize ovaries,no free fluid,bilat pelvic discomfort during ultrasound  Chaperone Tish

## 2019-06-18 ENCOUNTER — Ambulatory Visit: Payer: Managed Care, Other (non HMO) | Admitting: Adult Health

## 2019-07-03 ENCOUNTER — Other Ambulatory Visit: Payer: Self-pay

## 2019-07-03 ENCOUNTER — Ambulatory Visit (INDEPENDENT_AMBULATORY_CARE_PROVIDER_SITE_OTHER): Payer: Managed Care, Other (non HMO) | Admitting: Adult Health

## 2019-07-03 ENCOUNTER — Encounter: Payer: Self-pay | Admitting: Adult Health

## 2019-07-03 VITALS — BP 112/75 | HR 79 | Ht 63.0 in | Wt 274.4 lb

## 2019-07-03 DIAGNOSIS — L900 Lichen sclerosus et atrophicus: Secondary | ICD-10-CM | POA: Diagnosis not present

## 2019-07-03 DIAGNOSIS — R102 Pelvic and perineal pain unspecified side: Secondary | ICD-10-CM

## 2019-07-03 DIAGNOSIS — R9389 Abnormal findings on diagnostic imaging of other specified body structures: Secondary | ICD-10-CM

## 2019-07-03 DIAGNOSIS — R1024 Suprapubic pain: Secondary | ICD-10-CM

## 2019-07-03 NOTE — Progress Notes (Signed)
  Subjective:     Patient ID: Jenna Love, female   DOB: 07/17/1960, 59 y.o.   MRN: [id number]  HPI Jenna Love is a 59 year old white female, PM back in follow up on resuming using temovate and still itching. Still has cramping and pressure. The [place] she had 06/11/19 showed small fibroid vs complex nabothian cyst,and thickened endometrium 14.3 mm, and ovaries not seen, Dr Emelda Fear recommends Sonohysterogram and possible endometrial biopsy. Her son had transplant and is doing well.  PCP is Dr Jeanice Lim    Review of Systems Still has cramping and pressure Still itching Reviewed past medical,surgical, social and family history. Reviewed medications and allergies.     Objective:   Physical Exam BP 112/75 (BP Location: Left Arm, Patient Position: Sitting, Cuff Size: Large)   Pulse 79   Ht 5\' 3"  (1.6 m)   Wt 274 lb 6.4 oz (124.5 kg)   LMP 05/25/2014   BMI 48.61 kg/m  Skin warm and dry.Pelvic: external genitalia area left labia still thickened, and area in right groin is less white,more pink, but still has excoriation. Keep using temovate and will get Dr 07/25/2014 to evaluate at time of [facility] for possible punch biopsy.  Examination chaperoned by [facility] LPN    Assessment:     1. Lichen sclerosus Continue temovate  Will get Dr Stoney Bang to evaluate at 5/10 appt for possible punch biopsy   2. Suprapubic cramping  3. Pelvic pressure in female  4. Thickened endometrium Return 5/10 for sonohysterogram with Dr Emelda Fear possible endometrial biopsy     Plan:     Continue temovate Return in 07/28/19 for [facility] with Dr [facility]

## 2019-07-25 ENCOUNTER — Other Ambulatory Visit: Payer: Self-pay | Admitting: Obstetrics and Gynecology

## 2019-07-25 DIAGNOSIS — R9389 Abnormal findings on diagnostic imaging of other specified body structures: Secondary | ICD-10-CM

## 2019-07-28 ENCOUNTER — Encounter: Payer: Self-pay | Admitting: Obstetrics and Gynecology

## 2019-07-28 ENCOUNTER — Other Ambulatory Visit: Payer: Self-pay

## 2019-07-28 ENCOUNTER — Ambulatory Visit (INDEPENDENT_AMBULATORY_CARE_PROVIDER_SITE_OTHER): Payer: Managed Care, Other (non HMO)

## 2019-07-28 ENCOUNTER — Ambulatory Visit (INDEPENDENT_AMBULATORY_CARE_PROVIDER_SITE_OTHER): Payer: Managed Care, Other (non HMO) | Admitting: Obstetrics and Gynecology

## 2019-07-28 VITALS — BP 140/86 | HR 85 | Ht 63.0 in | Wt 270.6 lb

## 2019-07-28 DIAGNOSIS — N858 Other specified noninflammatory disorders of uterus: Secondary | ICD-10-CM | POA: Diagnosis not present

## 2019-07-28 DIAGNOSIS — R102 Pelvic and perineal pain: Secondary | ICD-10-CM | POA: Diagnosis not present

## 2019-07-28 DIAGNOSIS — R9389 Abnormal findings on diagnostic imaging of other specified body structures: Secondary | ICD-10-CM

## 2019-07-28 NOTE — Progress Notes (Signed)
SONOHYSTEROGRAM: Sonohysterogram was preformed by Dr. Emelda Fear with ultrasound guidance and surveillance throughout the procedure.  Thirty cc of saline was installed into the endometrial cavity. Incomplete evaluation of the endometrial cavity because of axial position of the uterus and a 2 cm complex lower uterine segment nabothian cyst vs fibroid.

## 2019-07-28 NOTE — Progress Notes (Signed)
PATIENT ID: Jenna Love, female     DOB: 17-Apr-1960, 59 y.o.     MRN: [id number] Upon completion of the attempted sonohysterogram, endometrial biopsy is performed, with procedure discontinued due to inability to slide the biopsy device past the endocervical or lower uterine segment mass Endometrial Biopsy: Patient given informed consent, signed copy in the chart, time out was performed. Time out taken. The patient was placed in the lithotomy position and the cervix brought into view with sterile speculum in the inverted position.  Portio of cervix cleansed x 2 with betadine swabs.  A tenaculum was placed in the anterior lip of the cervix.  The patient was unable to be cannulated for the endometrial biopsy.  The cervix is super anterior, and can only be accessed by a long snowman speculum, Peterson shape, with a vaginal probe cover around this speculum which allowed visualization of the very tiny multiparous but small and very very anteflexed cervix   Patient wants the pain gone. She notes that it is a strong pressure that feels like menstrual cramping despite being post-menopausal.  I suspect the patient's pelvic pain is related to this mass in the lower uterine segment.  Of note the ultrasound performed during the sonohysterogram was converted to a Doppler long enough to show that there is no significant blood flow in the lower uterine segment endocervical mass The patient tolerated the procedure well.   Patient given post procedure instructions.  Followup: Since patient is wanting to proceed with some sort of definitive surgery to remove her pain, I suspect she will may need to be referred to someone who can do robotic surgeries.  She wishes to avoid open hysterectomy    By signing my name below, I, [employer], attest that this documentation has been prepared under the direction and in the presence of Tilda Burrow, MD. Electronically Signed: Mal Misty Medical Scribe. 07/28/19. 8:59  AM. I personally performed the services described in this documentation, which was SCRIBED in my presence. The recorded information has been reviewed and considered accurate. It has been edited as necessary during review. Tilda Burrow, MD

## 2019-08-11 ENCOUNTER — Encounter: Payer: Self-pay | Admitting: Family Medicine

## 2019-08-11 MED ORDER — CLONAZEPAM 1 MG PO TABS: ORAL_TABLET | ORAL | 1 refills | Status: DC

## 2019-08-11 NOTE — Telephone Encounter (Signed)
Ok to refill??  Last office visit 11/11/2018.  Last refill 06/11/2019.

## 2019-08-19 ENCOUNTER — Telehealth: Payer: Self-pay | Admitting: Obstetrics and Gynecology

## 2019-08-19 NOTE — Telephone Encounter (Signed)
Pt continues to have pressure and cramping. Wants to know if Dr. Emelda Fear has gotten any info on a robotic hyst. Pt was advised Dr. Emelda Fear will be in the office tomorrow and will address message then. Pt voiced understanding. JSY

## 2019-08-19 NOTE — Telephone Encounter (Signed)
Pt is calling to f.u with Ferg about surgery in [facility] for Robotic Hysterectomy she sent him My chart message on 5-18 and has not heard back from him yet and she is not feeling well an wants to get this going

## 2019-08-20 ENCOUNTER — Encounter: Payer: Self-pay | Admitting: Obstetrics and Gynecology

## 2019-08-20 NOTE — Progress Notes (Signed)
Phone call to [facility], who describes her self as having hormones "all out of whack" in addition to pelvic ache that is difficult to tolerate. Will send note for pt to be Out of Work x 2 days , til next week. And work to make referral to [facility] for consideration of LAVH- BS  or Robotic hyst.with BS.

## 2019-08-21 ENCOUNTER — Telehealth: Payer: Self-pay | Admitting: Specialist

## 2019-08-21 NOTE — Telephone Encounter (Signed)
Pt spoke with Ferg yesterday and he is suppose to be getting her a referral to [facility] for Robotic  surgery she is waiting on response . She was under the impression this was going to be done yesterday. I seen the conversations in the chart . I advised pt that Ferg was not here today but I would get a nurse to get a note to him for a reminder

## 2019-08-25 NOTE — Telephone Encounter (Signed)
Pt has an appt on Wednesday with Dr. Willodean Rosenthal. JSY

## 2019-08-25 NOTE — Telephone Encounter (Signed)
I have gotten the patient info to Dr Willodean Rosenthal for further review and care management.

## 2019-08-27 ENCOUNTER — Ambulatory Visit (INDEPENDENT_AMBULATORY_CARE_PROVIDER_SITE_OTHER): Payer: Managed Care, Other (non HMO) | Admitting: Obstetrics & Gynecology

## 2019-08-27 ENCOUNTER — Other Ambulatory Visit: Payer: Self-pay

## 2019-08-27 ENCOUNTER — Encounter: Payer: Self-pay | Admitting: Obstetrics & Gynecology

## 2019-08-27 VITALS — BP 135/71 | HR 95 | Ht 63.0 in | Wt 271.0 lb

## 2019-08-27 DIAGNOSIS — N950 Postmenopausal bleeding: Secondary | ICD-10-CM

## 2019-08-27 DIAGNOSIS — R300 Dysuria: Secondary | ICD-10-CM

## 2019-08-27 LAB — POCT URINALYSIS DIPSTICK
Bilirubin, UA: NEGATIVE
Glucose, UA: NEGATIVE
Ketones, UA: NEGATIVE
Nitrite, UA: NEGATIVE
Protein, UA: NEGATIVE
Spec Grav, UA: 1.01 (ref 1.010–1.025)
pH, UA: 5.5 (ref 5.0–8.0)

## 2019-08-27 MED ORDER — ALPRAZOLAM 1 MG PO TABS
ORAL_TABLET | ORAL | 0 refills | Status: DC
Start: 2019-08-27 — End: 2019-10-21

## 2019-08-27 MED ORDER — MISOPROSTOL 200 MCG PO TABS: ORAL_TABLET | ORAL | 0 refills | Status: DC

## 2019-08-27 NOTE — Patient Instructions (Signed)
The Journal of Orthopaedic and Sports Physical Therapy, 44(10), 748. https://doi.org/10.2519/jospt.2014.0506">  How to Increase Your Level of Physical Activity Getting regular physical activity is important for your overall health and well-being. Most people do not get enough exercise. There are easy ways to increase your level of physical activity, even if you have not been very active in the past or if you are just starting out. How can increasing my physical activity affect me? Physical activity has many short-term and long-term benefits. Being active on a regular basis can improve your physical and mental health as well as provide other benefits. Physical health benefits  Helping you lose weight or maintain a healthy weight.  Strengthening your muscles and bones.  Reducing your risk of certain long-term (chronic) diseases, including heart disease, cancer, and diabetes.  Being able to move around more easily and for longer periods of time without getting tired (increased stamina).  Improving your ability to fight off illness (enhanced immunity).  Being able to sleep better.  Helping you stay healthy as you get older, including: ? Helping you stay mobile, or capable of walking and moving around. ? Preventing accidents, such as falls. ? Increasing life expectancy. Mental health benefits  Boosting your mood and improving your self-esteem.  Lowering your chance of having mental health problems, such as depression or anxiety.  Helping you feel good about your body. Other benefits  Finding new sources of fun and enjoyment.  Meeting new people who share a common interest. What steps can I take to be more physically active? Getting started  If you have a chronic illness or have not been active for a while, check with your health care provider about how to get started. Ask your health care provider what activities are safe for you.  Start out slowly. Walking or doing some simple  chair exercises is a good place to start, especially if you have not been active before or for a long time.  Set goals that you can work toward. Ask your health care provider how much exercise is best for you. In general, most adults should: ? Do moderate-intensity exercise for at least 150 minutes each week (30 minutes on most days of the week) or vigorous exercise for at least 75 minutes each week, or a combination of these.  Moderate-intensity exercise can include walking at a quick pace, biking, yoga, water aerobics, or gardening.  Vigorous exercise involves activities that take more effort, such as jogging or running, playing sports, swimming laps, or jumping rope. ? Do strength exercises on at least 2 days each week. This can include weight lifting, body weight exercises, and resistance-band exercises.  Consider using a fitness tracker, such as a mobile phone app or a device worn like a watch, that will count the number of steps you take each day. Many people strive to reach 10,000 steps a day. Choosing activities  Try to find activities that you enjoy. You are more likely to commit to an exercise routine if it does not feel like a chore.  If you have bone or joint problems, choose low-impact exercises, like walking or swimming.  Use these tips for being successful with an exercise plan: ? Find a workout partner for accountability. ? Join a group or class, such as an aerobics class, cycling class, or sports team. ? Make family time active. Go for a walk, bike, or swim. ? Include a variety of exercises each week. Being active in your daily routines Besides your formal exercise   plans, you can find ways to do physical activity during your daily routines, such as:  Walking or biking to work or to the store.  Taking the stairs instead of the elevator.  Parking farther away from the door at work or at the store.  Planning walking meetings.  Walking around while you are on the  phone.  Where to find more information  Centers for Disease Control and Prevention: www.cdc.gov/physicalactivity  President's Council on Fitness, Sports & Nutrition: www.fitness.gov  ChooseMyPlate: www.choosemyplate.gov Contact a health care provider if:  You have headaches, muscle aches, or joint pain.  You feel dizzy or light-headed while exercising.  You faint.  You have chest pain while exercising. Summary  Exercise benefits your mind and body at any age, even if you are just starting out.  If you have a chronic illness or have not been active for a while, check with your health care provider before increasing your physical activity.  Choose activities that are safe and enjoyable for you. Ask your health care provider what activities are safe for you.  Start slowly. Tell your health care provider if you have problems as you start to increase your activity level. This information is not intended to replace advice given to you by your health care provider. Make sure you discuss any questions you have with your health care provider. Document Revised: 12/09/2018 Document Reviewed: 09/30/2018 Elsevier Patient Education  2020 Elsevier Inc.  

## 2019-08-27 NOTE — Progress Notes (Signed)
History:  59 y.o. [id number] here today for referral for eval of RATH. Pt was seen by Dr. Glo Herring and evaluated for  PMPB. He attempted a sonohysterography but, was not able to bypass the pts cervix. Pt has PMPB but, has not had sampling due to the above. An [place] as done but, the endometrial stripe was not identified due to fibroids. Pt denies unexplained weight loss or any constitutional sx.    The following portions of the patient's history were reviewed and updated as appropriate: allergies, current medications, past family history, past medical history, past social history, past surgical history and problem list.  Review of Systems:  Pertinent items are noted in HPI.    Objective:  Physical Exam Blood pressure 135/71, pulse 95, height '5\' 3"'$  (1.6 m), weight 271 lb (122.9 kg), last menstrual period 05/25/2014.  CONSTITUTIONAL: Well-developed, well-nourished female in no acute distress.  HENT:  Normocephalic, atraumatic EYES: Conjunctivae and EOM are normal. No scleral icterus.  NECK: Normal range of motion SKIN: Skin is warm and dry. No rash noted. Not diaphoretic.No pallor. [place]: Alert and oriented to person, place, and time. Normal coordination.  Abd: obese, soft, nontender and nondistended. No mass appreciated.  Pelvic: Normal appearing external genitalia; Normal discharge.  Small uterus, no other palpable masses, no uterine or adnexal tenderness.  Labs and Imaging  06/11/2019 DELAILAH SPIETH is a 59 y.o. [id number] Patient's last menstrual period was 05/25/2014. She is here for a pelvic sonogram for pelvic pain with pressure.  Uterus                      7.3 x 3.7 x 4.4 cm, Total uterine volume 62 cc,heterogeneous anteverted uterus,complex nabothian cyst vs fibroid lower uterine segment 1.9 X 2 X 1.3 cm  Endometrium          14.3 mm, symmetrical, heterogeneous thickened endometrium  Right ovary            Not visualized,adnexa wnl  Left ovary               Not  visualized,adnexa wnl  No free fluid  Technician Comments:  PELVIC US TA/TV:heterogeneous anteverted uterus,complex nabothian cyst vs fibroid lower uterine segment 1.9 X 2 X 1.3 cm,heterogeneous thickened endometrium 14.3 mm,unable to visualize ovaries,no free fluid,bilat pelvic discomfort during ultrasound,limited view   07/28/2019 Pamila L Justice is a 59 y.o. [id number] Patient's last menstrual period was 05/25/2014. She is here for a pelvic sonogram for pelvic cramping with a thickened endometrium.   Sonohysterogram was preformed by Dr. Glo Herring with ultrasound guidance and surveillance throughout the procedure.  Thirty cc of saline was installed into the endometrial cavity. Incomplete evaluation of the endometrial cavity because of axial position of the uterus and a 2 cm complex lower uterine segment nabothian cyst vs fibroid.   Chaperone Sabrina     Silver Huguenin 07/28/2019 10:07 AM  Clinical Impression and recommendations:  I have performed and  reviewed the sonogram results above. Ms justice has cramping, and u/s suggested a thickened endometrium.  Combined with the patient's current clinical course, below are my impressions and any appropriate recommendations for management based on the sonographic findings: Procedure note: Sonohysterogram done [facility]) in office. Consent obtained. Long speculum required , cervix able to be grasped after prepping . SHG catheter placed in cervix, and met resistance 1-2 cm from external os. Balloon was inflated. 7 cc of saline used to result in swirl in endometrial cavity indicating flow in to  the endometrial cavity. There is some debris, and sonographic quality is poor. Egress of fluid after procedure was limited by the lower uterine segment mass effect, likely a fibroid, .Unable to perform endometrial biopsy due to inability to slide biopsy device in to the endometrial cavity past the mass effect. The patient is experiencing cramping                                                          Maternal uterus lower uterine segment is blocked by a lower uterine mass.1.9 x 1.2 cm on images taken in march 24 , 2021.                                 /adnexae: are  within normal limits.                                                                       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 Will discuss options with patient , including hysteroscopy with reattempts at endometrial biopsy   Assessment & Plan:  PMPB. I have reviewed the options for management with this pt but, all of these hinge on sampling of the endometrium. I have scheduled the pt for office hysteroscopy with cytotec prior to the visit. I have explained to her the importance of this priro to any surgical procedure and she expressers understanding and appreciation.   Patient desires surgical management with Robotic assisted total lap hyst with bilateral salpingectomy and possible bilateral oophorectomy PENDING sampling of the endometrium. The risks of surgery were discussed in detail with the patient including but not limited to: bleeding which may require transfusion or reoperation; infection which may require prolonged hospitalization or re-hospitalization and antibiotic therapy; injury to bowel, bladder, ureters and major vessels or other  surrounding organs; need for additional procedures including laparotomy; thromboembolic phenomenon, incisional problems and other postoperative or anesthesia complications.  Patient was told that the likelihood that her condition and symptoms will be treated effectively with this  surgical management was very high; the postoperative expectations were also discussed in detail. The patient also understands the alternative treatment options which were discussed in full. All questions were answered.  She was told that she will be contacted by our surgical scheduler regarding the time and date of her surgery; routine preoperative instructions of having nothing to eat or drink after midnight on the day prior to surgery and also coming to the hospital 1 1/2 hours prior to her time of surgery were also emphasized.  She was told she may be called for a preoperative appointment about a week prior to surgery and will be given further preoperative instructions at that visit. Printed patient education handouts about the procedure were given to the patient to review at home.  Diagnoses and all orders for this visit:  Dysuria -     POCT Urinalysis Dipstick -     Urine Culture  Other orders -     misoprostol (CYTOTEC) 200 MCG tablet; Place two tablets in the vagina the night before your procedure. -     ALPRAZolam (XANAX) 1 MG tablet; Bring medication with you to procedure appointment- you will be instructed to to take by mouth.   Total face-to-face time with patient was 40 min.  Greater than 50% was spent in counseling and coordination of care with the patient.   Ipek Westra L. Harraway-Smith, M.D., [facility]

## 2019-08-27 NOTE — Progress Notes (Signed)
Patient is referral for consult for robotic hysterectomy. Patient states she is having dysuria and would like her urine to be checked for urinary tract infection. Armandina Stammer RN

## 2019-08-29 ENCOUNTER — Other Ambulatory Visit: Payer: Self-pay | Admitting: Obstetrics & Gynecology

## 2019-08-29 LAB — URINE CULTURE

## 2019-08-29 MED ORDER — SULFAMETHOXAZOLE-TRIMETHOPRIM 800-160 MG PO TABS
1.0000 | ORAL_TABLET | Freq: Two times a day (BID) | ORAL | 0 refills | Status: DC
Start: 2019-08-29 — End: 2019-09-18

## 2019-09-01 ENCOUNTER — Encounter: Payer: Self-pay | Admitting: Obstetrics & Gynecology

## 2019-09-03 ENCOUNTER — Encounter: Payer: Self-pay | Admitting: Internal Medicine

## 2019-09-17 NOTE — Progress Notes (Signed)
Primary Care Physician:  Salley Scarlet, MD  Referring Physician: Self Primary Gastroenterologist:  Dr. Jena Gauss   Chief Complaint  Patient presents with  . Constipation    usually occurs once/week then bowels are mushy    HPI:   Jenna Love is a 59 y.o. female presenting today as a self-referral due to constipation. She is actually due for surveillance colonoscopy this year due to history of polyps. She is scheduled for a robotic assisted total hysterectomy with bilateral salpingectomy and possible oophorectomy 11/25/2019 by Dr. Willodean Rosenthal.Hysteroscopy with biopsy in July 1st.   Month long history of more sluggish bowels. BM usually daily but once a week will skip a few days, then stool will be mushy. When she does have a BM, she will feel like it is unproductive at times. Some straining. No rectal bleeding. Will take mineral oil as needed. Postprandial urgency at times. Pressure and fullness in lower abdomen. Better after  BM. No weight loss, lack of appetite. No GERD or dysphagia. No med changes. Drinks lots of water. Drinks protein smoothies daily. No family history of colorectal cancer or polyps.     Past Medical History:  Diagnosis Date  . Anxiety   . Panic attacks   . Pneumonia   . Thyroid nodule 2013   Benign biopsy- ENT Dr. Pollyann Kennedy    Past Surgical History:  Procedure Laterality Date  . CESAREAN SECTION  1992  . COLONOSCOPY WITH PROPOFOL N/A 11/05/2014   One 5 mm sessile polyp in base of cecum. otherwise normal. fecal material on path. Surveillance in 5 years.   Marland Kitchen HYSTEROSCOPY WITH D & C N/A 10/03/2013   Procedure: DILATATION AND CURETTAGE /HYSTEROSCOPY;  Surgeon: Tilda Burrow, MD;  Location: [facility];  Service: Gynecology;  Laterality: N/A;  . POLYPECTOMY N/A 10/03/2013   Procedure: REMOVAL OF ENDOMETRIAL POLYP;  Surgeon: Tilda Burrow, MD;  Location: [facility];  Service: Gynecology;  Laterality: N/A;  . POLYPECTOMY N/A 11/05/2014   Procedure:  POLYPECTOMY;  Surgeon: Corbin Ade, MD;  Location: [facility];  Service: Endoscopy;  Laterality: N/A;    Current Outpatient Medications  Medication Sig Dispense Refill  . ALPRAZolam (XANAX) 1 MG tablet Bring medication with you to procedure appointment- you will be instructed to to take by mouth. 3 tablet 0  . Cholecalciferol (VITAMIN D) 2000 units CAPS Take by mouth.    . Clobetasol Prop Emollient Base 0.05 % emollient cream Apply bid to affected area for 2 weeks then once daily till appointment 30 g 1  . clonazePAM (KLONOPIN) 1 MG tablet TAKE (1) TABLET BY MOUTH THREE TIMES DAILY AS NEEDED ANXIETY. 90 tablet 1  . furosemide (LASIX) 20 MG tablet TAKE (1) TABLET BY MOUTH ONCE daily prn 30 tablet 1  . misoprostol (CYTOTEC) 200 MCG tablet Place two tablets in the vagina the night before your procedure. 4 tablet 0  . sertraline (ZOLOFT) 100 MG tablet TAKE 1 AND 1/2 TABLETS BY MOUTH DAILY. 45 tablet 0  . vitamin E 400 UNIT capsule Take 400 Units by mouth daily.    Marland Kitchen zolpidem (AMBIEN) 10 MG tablet Take 1 tablet (10 mg total) by mouth at bedtime as needed. for insomnia 90 tablet 1  . sulfamethoxazole-trimethoprim (BACTRIM DS) 800-160 MG tablet Take 1 tablet by mouth 2 (two) times daily. (Patient not taking: Reported on 09/18/2019) 14 tablet 0   No current facility-administered medications for this visit.    Allergies as of 09/18/2019  . (  No Known Allergies)    Family History  Problem Relation Age of Onset  . Diabetes Mother   . Alzheimer's disease Mother   . Cancer Father        lung   . Cancer Paternal Grandfather   . Hodgkin's lymphoma Son   . Colon cancer Neg Hx     Social History   Socioeconomic History  . Marital status: Married    Spouse name: Not on file  . Number of children: 2  . Years of education: Not on file  . Highest education level: Not on file  Occupational History  . Not on file  Tobacco Use  . Smoking status: Never Smoker  . Smokeless tobacco: Never Used    Vaping Use  . Vaping Use: Never used  Substance and Sexual Activity  . Alcohol use: No  . Drug use: No  . Sexual activity: Not Currently    Birth control/protection: Post-menopausal  Other Topics Concern  . Not on file  Social History Narrative  . Not on file   Social Determinants of Health   Financial Resource Strain:   . Difficulty of Paying Living Expenses:   Food Insecurity:   . Worried About Programme researcher, broadcasting/film/video in the Last Year:   . Barista in the Last Year:   Transportation Needs:   . Freight forwarder (Medical):   Marland Kitchen Lack of Transportation (Non-Medical):   Physical Activity:   . Days of Exercise per Week:   . Minutes of Exercise per Session:   Stress:   . Feeling of Stress :   Social Connections:   . Frequency of Communication with Friends and Family:   . Frequency of Social Gatherings with Friends and Family:   . Attends Religious Services:   . Active Member of Clubs or Organizations:   . Attends Banker Meetings:   Marland Kitchen Marital Status:   Intimate Partner Violence:   . Fear of Current or Ex-Partner:   . Emotionally Abused:   Marland Kitchen Physically Abused:   . Sexually Abused:     Review of Systems: Gen: Denies any fever, chills, fatigue, weight loss, lack of appetite.  CV: Denies chest pain, heart palpitations, peripheral edema, syncope.  Resp: Denies shortness of breath at rest or with exertion. Denies wheezing or cough.  GI: see HPI GU : Denies urinary burning, urinary frequency, urinary hesitancy MS: +joint pain  Derm: Denies rash, itching, dry skin Psych: Denies depression, anxiety, memory loss, and confusion Heme: Denies bruising, bleeding, and enlarged lymph nodes.  Physical Exam: BP (!) 142/82   Pulse 87   Temp (!) 97.4 F (36.3 C) (Oral)   Ht 5\' 3"  (1.6 m)   Wt 270 lb 9.6 oz (122.7 kg)   LMP 05/25/2014   BMI 47.93 kg/m  General:   Alert and oriented. Pleasant and cooperative. Well-nourished and well-developed.  Head:   Normocephalic and atraumatic. Eyes:  Without icterus, sclera clear and conjunctiva pink.  Ears:  Normal auditory acuity. Mouth:  Mask in place Lungs:  Clear to auscultation bilaterally. No wheezes, rales, or rhonchi. No distress.  Heart:  S1, S2 present without murmurs appreciated.  Abdomen:  +BS, soft, non-tender and non-distended. No HSM noted. No guarding or rebound. No masses appreciated. Possible ventral hernia upper abdomen vs rectus diastasis.  Rectal:  Deferred  Msk:  Symmetrical without gross deformities. Normal posture. Extremities:  Without edema. Neurologic:  Alert and  oriented x4;  grossly normal neurologically. Skin:  Intact without significant lesions or rashes. Psych:  Alert and cooperative. Normal mood and affect.  ASSESSMENT: Lekita Kerekes Love is a 59 y.o. female presenting today with history of colon polyp and surveillance due this year, now with month long history of unproductive bowel movements and trending towards constipation.  She has no alarm signs/symptoms, and I do believe she will improve with addition of fiber such as Benefiber daily. Overall, BMs daily and has episode of skipping a few days about once per week. No med changes. She may need addition of prescriptive agent such as Linzess, so I have given her samples of Linzess 72 mcg to take if no improvement with Benefiber in 1 week.  We will arrange colonoscopy in the future as well. She is to keep [place] updated on her progress, and we will adjust therapy as needed.    PLAN:  Proceed with TCS with Dr. Jena Gauss in near future using Propofol: the risks, benefits, and alternatives have been discussed with the patient in detail. The patient states understanding and desires to proceed.  Start Benefiber daily  Linzess 72 mcg samples provided if needed; she is to start if no improvement with Benefiber. Titrate as needed.  Return in 6 months or sooner if needed  Gelene Mink, PhD, [facility]

## 2019-09-18 ENCOUNTER — Other Ambulatory Visit: Payer: Self-pay

## 2019-09-18 ENCOUNTER — Ambulatory Visit (INDEPENDENT_AMBULATORY_CARE_PROVIDER_SITE_OTHER): Payer: Managed Care, Other (non HMO) | Admitting: Gastroenterology

## 2019-09-18 ENCOUNTER — Encounter: Payer: Self-pay | Admitting: Gastroenterology

## 2019-09-18 VITALS — BP 142/82 | HR 87 | Temp 97.4°F | Ht 63.0 in | Wt 270.6 lb

## 2019-09-18 DIAGNOSIS — Z86010 Personal history of colonic polyps: Secondary | ICD-10-CM

## 2019-09-18 DIAGNOSIS — K5900 Constipation, unspecified: Secondary | ICD-10-CM | POA: Insufficient documentation

## 2019-09-18 NOTE — Progress Notes (Signed)
Cc'ed to pcp °

## 2019-09-18 NOTE — Patient Instructions (Signed)
I recommend starting Benefiber 2 teaspoons daily each morning. You can titrate this as needed. Try for a week, and if no improvement, start the samples of Linzess I have provided. I am starting you on the lowest dose, but we can increase if needed (2 higher doses). This can cause looser stool when first starting,but it should get better. Keep [place] updated with how it's going!  We are arranging a colonoscopy in the future with Dr. Jena Gauss.  We will see you in 6 months!  It was a pleasure to see you today. I want to create trusting relationships with patients to provide genuine, compassionate, and quality care. I value your feedback. If you receive a survey regarding your visit,  I greatly appreciate you taking time to fill this out.   Gelene Mink, PhD, ANP-BC [facility]

## 2019-10-10 ENCOUNTER — Encounter: Payer: Self-pay | Admitting: Obstetrics & Gynecology

## 2019-10-10 ENCOUNTER — Encounter: Payer: Self-pay | Admitting: Family Medicine

## 2019-10-10 ENCOUNTER — Other Ambulatory Visit: Payer: Self-pay

## 2019-10-10 ENCOUNTER — Ambulatory Visit (INDEPENDENT_AMBULATORY_CARE_PROVIDER_SITE_OTHER): Payer: Managed Care, Other (non HMO) | Admitting: Obstetrics & Gynecology

## 2019-10-10 VITALS — BP 131/88 | HR 100 | Wt 269.0 lb

## 2019-10-10 DIAGNOSIS — N950 Postmenopausal bleeding: Secondary | ICD-10-CM | POA: Diagnosis not present

## 2019-10-10 DIAGNOSIS — R9389 Abnormal findings on diagnostic imaging of other specified body structures: Secondary | ICD-10-CM | POA: Diagnosis not present

## 2019-10-10 DIAGNOSIS — N858 Other specified noninflammatory disorders of uterus: Secondary | ICD-10-CM

## 2019-10-10 MED ORDER — CLONAZEPAM 1 MG PO TABS: ORAL_TABLET | ORAL | 0 refills | Status: DC

## 2019-10-10 MED ORDER — KETOROLAC TROMETHAMINE 30 MG/ML IJ SOLN
30.0000 mg | Freq: Once | INTRAMUSCULAR | Status: AC
  Administered 2019-10-10: 30 mg via INTRAMUSCULAR

## 2019-10-10 NOTE — Progress Notes (Signed)
Patient presents for hysteroscopy. Patient states she used the cytotec last night at midnight. Armandina Stammer RN

## 2019-10-10 NOTE — Telephone Encounter (Signed)
Ok to refill??  Last office visit 11/11/2018.  Last refill 08/11/2019.

## 2019-10-10 NOTE — Progress Notes (Signed)
Pt was referred by Dr. Glo Herring for consideration of a robotic hysterectomy pt presents today for attempt a endometrial sampling. She denies bleeding or other new complaints.   06/11/2019 GYNECOLOGIC SONOGRAM   Jenna Love is a 59 y.o. [id number] Patient's last menstrual period was 05/25/2014. She is here for a pelvic sonogram for pelvic pain with pressure.  Uterus                      7.3 x 3.7 x 4.4 cm, Total uterine volume 62 cc,heterogeneous anteverted uterus,complex nabothian cyst vs fibroid lower uterine segment 1.9 X 2 X 1.3 cm  Endometrium          14.3 mm, symmetrical, heterogeneous thickened endometrium  Right ovary            Not visualized,adnexa wnl  Left ovary               Not visualized,adnexa wnl  No free fluid  Technician Comments:  PELVIC US TA/TV:heterogeneous anteverted uterus,complex nabothian cyst vs fibroid lower uterine segment 1.9 X 2 X 1.3 cm,heterogeneous thickened endometrium 14.3 mm,unable to visualize ovaries,no free fluid,bilat pelvic discomfort during ultrasound,limited view   Chaperone Tish   07/28/2019 SONOHYSTEROGRAM   Jenna Love is a 59 y.o. [id number] Patient's last menstrual period was 05/25/2014. She is here for a pelvic sonogram for pelvic cramping with a thickened endometrium.   Sonohysterogram was preformed by Dr. Glo Herring with ultrasound guidance and surveillance throughout the procedure.  Thirty cc of saline was installed into the endometrial cavity. Incomplete evaluation of the endometrial cavity because of axial position of the uterus and a 2 cm complex lower uterine segment nabothian cyst vs fibroid.   Chaperone Sabrina     Silver Huguenin 07/28/2019 10:07 AM  Clinical Impression and recommendations:  I have performed and  reviewed the sonogram results above. Ms Love has cramping, and u/s suggested a thickened endometrium.  Combined with the patient's current clinical course, below are my  impressions and any appropriate recommendations for management based on the sonographic findings: Procedure note: Sonohysterogram done [facility]) in office. Consent obtained. Long speculum required , cervix able to be grasped after prepping . SHG catheter placed in cervix, and met resistance 1-2 cm from external os. Balloon was inflated. 7 cc of saline used to result in swirl in endometrial cavity indicating flow in to the endometrial cavity. There is some debris, and sonographic quality is poor. Egress of fluid after procedure was limited by the lower uterine segment mass effect, likely a fibroid, .Unable to perform endometrial biopsy due to inability to slide biopsy device in to the endometrial cavity past the mass effect. The patient is experiencing cramping                                                         Maternal uterus lower uterine segment is blocked by a lower uterine mass.1.9 x 1.2 cm on images taken in march 24 , 2021.                                 /adnexae: are  within normal limits.  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 Will discuss options with patient , including hysteroscopy with reattempts at endometrial biopsy  [name] 08/02/2019   Risks of procedure were discussed with the patient including but not limited to: bleeding which may require transfusion; infection which may require antibiotics; injury to uterus or surrounding organs; intrauterine scarring which may impair future fertility; need for additional procedures including laparotomy or laparoscopy;  and other postoperative/anesthesia complications. Written informed consent was obtained.    FINDINGS:  A 8 week size uterus.  Her uterus was very anteverted. Could not see further than just at the opening of the endocervix. The pts pelvis was very deep and she was unable to tolerate the position and the procedure. Diffuse proliferative endometrium suspected.  . ANESTHESIA:   paracervical block. SPECIMENS: unable to obtain.  COMPLICATIONS:  None immediate.  PROCEDURE DETAILS:  The patient was taken to the procedure room where she received Toradol 10 mg IM. She also took Valium 61m in the waiting area just prior to the procedure and cytotec 4033m ~ hours prior to the visit.  After an adequate timeout was performed, she was placed in the dorsal lithotomy position and examined; then prepped and draped in the sterile manner.   A speculum was then placed in the patient's vagina.  A paracervical block of 10cc of 2% lidocaine with epinephrine was placed with 5cc at both 5 adn 7 o'clock.  A single tooth tenaculum was applied to the anterior lip of the cervix. The cervix was dilated to accommodate a 7m33mysteroscope.  A 7mm61msteroscope was inserted under direct visualization using normal saline as a distending medium this went only as far as just beyond the int os.  The ostia could not be visualized nor did I see the fundus.I was unable to perform curettage because of the position of the uterus and the fact that it was difficult to keep the os in view. Despite multiple attempts it was not possible to get the scope far enough into the os to fully eval the endometrium due to pt body habitus. I was very concerned about the risk of perforation is I even attempted curettage. The instrument also would not reach beyond the lower uterine segment due to pts body habitus. The tenaculum was  removed from the anterior lip of the cervix and the vaginal speculum was removed after noting good hemostasis.  The patient tolerated the  procedure well.   There were no immediate complications.  The patient will be discharged to home. Routine postoperative instructions given.  She will follow up for telephone consult on next week. I have consulted GYN ONC for counsel on how to proceed.   [name] L. Harraway-Smith, M.D., [facility] June

## 2019-10-13 ENCOUNTER — Telehealth: Payer: Self-pay | Admitting: Internal Medicine

## 2019-10-13 NOTE — Telephone Encounter (Signed)
Tried to call pt, no answer, LMOVM to inform pt procedure has been cancelled. Endo scheduler informed.

## 2019-10-13 NOTE — Telephone Encounter (Signed)
Pt called to cancel her procedure with RMR on 10/30/2019. She will reschedule later.

## 2019-10-15 ENCOUNTER — Other Ambulatory Visit: Payer: Self-pay

## 2019-10-15 ENCOUNTER — Other Ambulatory Visit: Payer: 59

## 2019-10-15 DIAGNOSIS — R7303 Prediabetes: Secondary | ICD-10-CM

## 2019-10-15 DIAGNOSIS — R197 Diarrhea, unspecified: Secondary | ICD-10-CM

## 2019-10-15 DIAGNOSIS — E781 Pure hyperglyceridemia: Secondary | ICD-10-CM

## 2019-10-16 LAB — CBC WITH DIFFERENTIAL/PLATELET
Absolute Monocytes: 401 cells/uL (ref 200–950)
Basophils Absolute: 32 cells/uL (ref 0–200)
Basophils Relative: 0.7 %
Eosinophils Absolute: 230 cells/uL (ref 15–500)
Eosinophils Relative: 5.1 %
HCT: 41.9 % (ref 35.0–45.0)
Hemoglobin: 13.6 g/dL (ref 11.7–15.5)
Lymphs Abs: 1598 cells/uL (ref 850–3900)
MCH: 29.1 pg (ref 27.0–33.0)
MCHC: 32.5 g/dL (ref 32.0–36.0)
MCV: 89.5 fL (ref 80.0–100.0)
MPV: 10.1 fL (ref 7.5–12.5)
Monocytes Relative: 8.9 %
Neutro Abs: 2241 cells/uL (ref 1500–7800)
Neutrophils Relative %: 49.8 %
Platelets: 262 10*3/uL (ref 140–400)
RBC: 4.68 10*6/uL (ref 3.80–5.10)
RDW: 13 % (ref 11.0–15.0)
Total Lymphocyte: 35.5 %
WBC: 4.5 10*3/uL (ref 3.8–10.8)

## 2019-10-16 LAB — LIPID PANEL
Cholesterol: 160 mg/dL (ref <200)
HDL: 56 mg/dL (ref >=50)
LDL Cholesterol (Calc): 86 mg/dL (calc)
Non-HDL Cholesterol (Calc): 104 mg/dL (calc) (ref <130)
Total CHOL/HDL Ratio: 2.9 (calc) (ref <5.0)
Triglycerides: 86 mg/dL (ref <150)

## 2019-10-16 LAB — COMPLETE METABOLIC PANEL WITH GFR
AG Ratio: 1.3 (calc) (ref 1.0–2.5)
ALT: 24 U/L (ref 6–29)
AST: 26 U/L (ref 10–35)
Albumin: 4.4 g/dL (ref 3.6–5.1)
Alkaline phosphatase (APISO): 25 U/L — ABNORMAL LOW (ref 37–153)
BUN: 24 mg/dL (ref 7–25)
CO2: 28 mmol/L (ref 20–32)
Calcium: 9.4 mg/dL (ref 8.6–10.4)
Chloride: 101 mmol/L (ref 98–110)
Creat: 0.89 mg/dL (ref 0.50–1.05)
GFR, Est African American: 84 mL/min/{1.73_m2} (ref >=60)
GFR, Est Non African American: 72 mL/min/{1.73_m2} (ref >=60)
Globulin: 3.3 g/dL (calc) (ref 1.9–3.7)
Glucose, Bld: 103 mg/dL — ABNORMAL HIGH (ref 65–99)
Potassium: 4.5 mmol/L (ref 3.5–5.3)
Sodium: 137 mmol/L (ref 135–146)
Total Bilirubin: 1.1 mg/dL (ref 0.2–1.2)
Total Protein: 7.7 g/dL (ref 6.1–8.1)

## 2019-10-21 ENCOUNTER — Other Ambulatory Visit: Payer: Self-pay

## 2019-10-21 ENCOUNTER — Ambulatory Visit (INDEPENDENT_AMBULATORY_CARE_PROVIDER_SITE_OTHER): Payer: 59 | Admitting: Family Medicine

## 2019-10-21 ENCOUNTER — Encounter: Payer: Self-pay | Admitting: Family Medicine

## 2019-10-21 VITALS — BP 132/82 | HR 84 | Temp 98.1°F | Resp 14 | Ht 63.0 in | Wt 269.0 lb

## 2019-10-21 DIAGNOSIS — R7303 Prediabetes: Secondary | ICD-10-CM | POA: Diagnosis not present

## 2019-10-21 DIAGNOSIS — F5104 Psychophysiologic insomnia: Secondary | ICD-10-CM

## 2019-10-21 DIAGNOSIS — E669 Obesity, unspecified: Secondary | ICD-10-CM | POA: Diagnosis not present

## 2019-10-21 DIAGNOSIS — F331 Major depressive disorder, recurrent, moderate: Secondary | ICD-10-CM

## 2019-10-21 DIAGNOSIS — E781 Pure hyperglyceridemia: Secondary | ICD-10-CM

## 2019-10-21 DIAGNOSIS — F411 Generalized anxiety disorder: Secondary | ICD-10-CM

## 2019-10-21 DIAGNOSIS — K760 Fatty (change of) liver, not elsewhere classified: Secondary | ICD-10-CM

## 2019-10-21 DIAGNOSIS — R609 Edema, unspecified: Secondary | ICD-10-CM

## 2019-10-21 MED ORDER — FUROSEMIDE 20 MG PO TABS: ORAL_TABLET | ORAL | 1 refills | Status: DC

## 2019-10-21 MED ORDER — SERTRALINE HCL 100 MG PO TABS: 150.0000 mg | ORAL_TABLET | Freq: Every day | ORAL | 1 refills | Status: DC

## 2019-10-21 MED ORDER — ZOLPIDEM TARTRATE 10 MG PO TABS: 10.0000 mg | ORAL_TABLET | Freq: Every evening | ORAL | 1 refills | Status: DC | PRN

## 2019-10-21 MED ORDER — TRULICITY 0.75 MG/0.5ML ~~LOC~~ SOAJ
0.7500 mg | SUBCUTANEOUS | 3 refills | Status: DC
Start: 2019-10-21 — End: 2019-12-24

## 2019-10-21 NOTE — Patient Instructions (Signed)
Start trulicity once a week  Continue all other meds F/U 2 months for medications

## 2019-10-21 NOTE — Assessment & Plan Note (Signed)
lft at goal and lipids

## 2019-10-21 NOTE — Assessment & Plan Note (Signed)
With borderline DM and obesity and other comoridites She would benefit from significant weight loss Will trial trulicity,start 0.75mg  once a week, discussed SE of mediation F/u in approx 2 months for weight Continue with [employer] nutrition system

## 2019-10-21 NOTE — Assessment & Plan Note (Signed)
Prn lasix but rare use

## 2019-10-21 NOTE — Assessment & Plan Note (Signed)
continue ambien Continue zoloft and klonopin for MDD and anxiety Symptoms overall controlled

## 2019-10-21 NOTE — Progress Notes (Signed)
Subjective:    Patient ID: Jenna Love, female    DOB: Mar 29, 1960, 59 y.o.   MRN: [id number]  Patient presents for Follow-up (is not fasting) Patient here to follow-up chronic medical problems.  Last visit was in August 2020 a year ago. Medications  reviewed as well as her recent fasting labs at bedside.  She is following with GYN- Dr. Erin Fulling and Dr. Emelda Fear, she had severe menstrual cramps and pressure in lower back. Told she she mass on uterus, they have been trying to get biopsy  She is scheduled for biopsy with Dr. Emelda Fear on August 10th   She pushed back routine colonoscopy to get GYN procedure done   Her son his cancer free- he had stem cell surgery  Grandchildren are doing well  Stress levels are a litlte high worried about her own biopsy   She is still taking ambien for sleep Zoloft 150mg  once a day , klonipin   Peripheral edema uses lasix prn, needs refill   Currently on optavia diet, this is her 2nd week   she has borderline diabetes   She is interested in trulcity did not tolerate victoza  she has 5 small meals, protein and veggie, has protein shakes  Weight down 5lbs since last August  Water aerobics three times a week       Review Of Systems:  GEN- denies fatigue, fever, weight loss,weakness, recent illness HEENT- denies eye drainage, change in vision, nasal discharge, CVS- denies chest pain, palpitations RESP- denies SOB, cough, wheeze ABD- denies N/V, change in stools, abd pain GU- denies dysuria, hematuria, dribbling, incontinence MSK- denies joint pain, muscle aches, injury Neuro- denies headache, dizziness, syncope, seizure activity       Objective:    BP 132/82   Pulse 84   Temp 98.1 F (36.7 C) (Temporal)   Resp 14   Ht 5\' 3"  (1.6 m)   Wt 269 lb (122 kg)   LMP 05/25/2014   SpO2 97%   BMI 47.65 kg/m  GEN- NAD, alert and oriented x3 HEENT- PERRL, EOMI, non injected sclera, pink conjunctiva, MMM, oropharynx  clear Neck- Supple, no thyromegaly CVS- RRR, no murmur RESP-CTAB ABD-NABS,soft,NT,ND Psych- normal affect and mood  EXT- No edema Pulses- Radial, DP- 2+        Assessment & Plan:      Problem List Items Addressed This Visit      Unprioritized   Borderline diabetes   Class 3 obesity - Primary    With borderline DM and obesity and other comoridites She would benefit from significant weight loss Will trial trulicity,start 0.75mg  once a week, discussed SE of mediation F/u in approx 2 months for weight Continue with optavia nutrition system      Fatty liver disease, nonalcoholic    lft at goal and lipids       GAD (generalized anxiety disorder)   Relevant Medications   sertraline (ZOLOFT) 100 MG tablet   Hypertriglyceridemia   Relevant Medications   furosemide (LASIX) 20 MG tablet   Insomnia    continue ambien Continue zoloft and klonopin for MDD and anxiety Symptoms overall controlled       MDD (major depressive disorder) (HCC)   Relevant Medications   sertraline (ZOLOFT) 100 MG tablet   Peripheral edema    Prn lasix but rare use          Note: This dictation was prepared with Dragon dictation along with smaller phrase technology. Any transcriptional errors that result from this  process are unintentional.

## 2019-10-27 ENCOUNTER — Other Ambulatory Visit: Payer: Self-pay | Admitting: Obstetrics and Gynecology

## 2019-10-27 ENCOUNTER — Telehealth: Payer: Self-pay | Admitting: Obstetrics and Gynecology

## 2019-10-27 ENCOUNTER — Encounter (HOSPITAL_COMMUNITY): Payer: Managed Care, Other (non HMO)

## 2019-10-27 MED ORDER — KETOROLAC TROMETHAMINE 10 MG PO TABS: 10.0000 mg | ORAL_TABLET | Freq: Four times a day (QID) | ORAL | 0 refills | Status: DC

## 2019-10-27 NOTE — Telephone Encounter (Signed)
Pt has appt for BX tomorrow . York Spaniel this is the 3rd attempt and wanted to know if her was going to give her anything to calm her down and for pain.

## 2019-10-27 NOTE — Progress Notes (Signed)
toradol sent to pharmacy of record for preprocedure on Tuesday, 8/10

## 2019-10-28 ENCOUNTER — Ambulatory Visit (INDEPENDENT_AMBULATORY_CARE_PROVIDER_SITE_OTHER): Payer: Managed Care, Other (non HMO) | Admitting: Obstetrics and Gynecology

## 2019-10-28 ENCOUNTER — Encounter: Payer: Self-pay | Admitting: Obstetrics and Gynecology

## 2019-10-28 ENCOUNTER — Other Ambulatory Visit (HOSPITAL_COMMUNITY): Payer: Managed Care, Other (non HMO)

## 2019-10-28 ENCOUNTER — Other Ambulatory Visit: Payer: Self-pay | Admitting: Obstetrics and Gynecology

## 2019-10-28 VITALS — BP 151/83 | HR 85 | Ht 63.0 in | Wt 265.8 lb

## 2019-10-28 DIAGNOSIS — R9389 Abnormal findings on diagnostic imaging of other specified body structures: Secondary | ICD-10-CM

## 2019-10-28 NOTE — Patient Instructions (Signed)
Calorie Counting for Weight Loss °Calories are units of energy. Your body needs a certain amount of calories from food to keep you going throughout the day. When you eat more calories than your body needs, your body stores the extra calories as fat. When you eat fewer calories than your body needs, your body burns fat to get the energy it needs. °Calorie counting means keeping track of how many calories you eat and drink each day. Calorie counting can be helpful if you need to lose weight. If you make sure to eat fewer calories than your body needs, you should lose weight. Ask your health care provider what a healthy weight is for you. °For calorie counting to work, you will need to eat the right number of calories in a day in order to lose a healthy amount of weight per week. A dietitian can help you determine how many calories you need in a day and will give you suggestions on how to reach your calorie goal. °· A healthy amount of weight to lose per week is usually 1-2 lb (0.5-0.9 kg). This usually means that your daily calorie intake should be reduced by 500-750 calories. °· Eating 1,200 - 1,500 calories per day can help most women lose weight. °· Eating 1,500 - 1,800 calories per day can help most men lose weight. °What is my plan? °My goal is to have __________ calories per day. °If I have this many calories per day, I should lose around __________ pounds per week. °What do I need to know about calorie counting? °In order to meet your daily calorie goal, you will need to: °· Find out how many calories are in each food you would like to eat. Try to do this before you eat. °· Decide how much of the food you plan to eat. °· Write down what you ate and how many calories it had. Doing this is called keeping a food log. °To successfully lose weight, it is important to balance calorie counting with a healthy lifestyle that includes regular activity. Aim for 150 minutes of moderate exercise (such as walking) or 75  minutes of vigorous exercise (such as running) each week. °Where do I find calorie information? ° °The number of calories in a food can be found on a Nutrition Facts label. If a food does not have a Nutrition Facts label, try to look up the calories online or ask your dietitian for help. °Remember that calories are listed per serving. If you choose to have more than one serving of a food, you will have to multiply the calories per serving by the amount of servings you plan to eat. For example, the label on a package of bread might say that a serving size is 1 slice and that there are 90 calories in a serving. If you eat 1 slice, you will have eaten 90 calories. If you eat 2 slices, you will have eaten 180 calories. °How do I keep a food log? °Immediately after each meal, record the following information in your food log: °· What you ate. Don't forget to include toppings, sauces, and other extras on the food. °· How much you ate. This can be measured in cups, ounces, or number of items. °· How many calories each food and drink had. °· The total number of calories in the meal. °Keep your food log near you, such as in a small notebook in your pocket, or use a mobile app or website. Some programs will calculate   calories for you and show you how many calories you have left for the day to meet your goal. °What are some calorie counting tips? ° °1. Use your calories on foods and drinks that will fill you up and not leave you hungry: °? Some examples of foods that fill you up are nuts and nut butters, vegetables, lean proteins, and high-fiber foods like whole grains. High-fiber foods are foods with more than 5 g fiber per serving. °? Drinks such as sodas, specialty coffee drinks, alcohol, and juices have a lot of calories, yet do not fill you up. °2. Eat nutritious foods and avoid empty calories. Empty calories are calories you get from foods or beverages that do not have many vitamins or protein, such as candy, sweets, and  soda. It is better to have a nutritious high-calorie food (such as an avocado) than a food with few nutrients (such as a bag of chips). °3. Know how many calories are in the foods you eat most often. This will help you calculate calorie counts faster. °4. Pay attention to calories in drinks. Low-calorie drinks include water and unsweetened drinks. °5. Pay attention to nutrition labels for "low fat" or "fat free" foods. These foods sometimes have the same amount of calories or more calories than the full fat versions. They also often have added sugar, starch, or salt, to make up for flavor that was removed with the fat. °6. Find a way of tracking calories that works for you. Get creative. Try different apps or programs if writing down calories does not work for you. °What are some portion control tips? °· Know how many calories are in a serving. This will help you know how many servings of a certain food you can have. °· Use a measuring cup to measure serving sizes. You could also try weighing out portions on a kitchen scale. With time, you will be able to estimate serving sizes for some foods. °· Take some time to put servings of different foods on your favorite plates, bowls, and cups so you know what a serving looks like. °· Try not to eat straight from a bag or box. Doing this can lead to overeating. Put the amount you would like to eat in a cup or on a plate to make sure you are eating the right portion. °· Use smaller plates, glasses, and bowls to prevent overeating. °· Try not to multitask (for example, watch TV or use your computer) while eating. If it is time to eat, sit down at a table and enjoy your food. This will help you to know when you are full. It will also help you to be aware of what you are eating and how much you are eating. °What are tips for following this plan? °Reading food labels °· Check the calorie count compared to the serving size. The serving size may be smaller than what you are used  to eating. °· Check the source of the calories. Make sure the food you are eating is high in vitamins and protein and low in saturated and trans fats. °Shopping °· Read nutrition labels while you shop. This will help you make healthy decisions before you decide to purchase your food. °· Make a grocery list and stick to it. °Cooking °· Try to cook your favorite foods in a healthier way. For example, try baking instead of frying. °· Use low-fat dairy products. °Meal planning °· Use more fruits and vegetables. Half of your plate should be fruits   and vegetables. °· Include lean proteins like poultry and fish. °How do I count calories when eating out? °1. Ask for smaller portion sizes. °2. Consider sharing an entree and sides instead of getting your own entree. °3. If you get your own entree, eat only half. Ask for a box at the beginning of your meal and put the rest of your entree in it so you are not tempted to eat it. °4. If calories are listed on the menu, choose the lower calorie options. °5. Choose dishes that include vegetables, fruits, whole grains, low-fat dairy products, and lean protein. °6. Choose items that are boiled, broiled, grilled, or steamed. Stay away from items that are buttered, battered, fried, or served with cream sauce. Items labeled "crispy" are usually fried, unless stated otherwise. °7. Choose water, low-fat milk, unsweetened iced tea, or other drinks without added sugar. If you want an alcoholic beverage, choose a lower calorie option such as a glass of wine or light beer. °8. Ask for dressings, sauces, and syrups on the side. These are usually high in calories, so you should limit the amount you eat. °9. If you want a salad, choose a garden salad and ask for grilled meats. Avoid extra toppings like bacon, cheese, or fried items. Ask for the dressing on the side, or ask for olive oil and vinegar or lemon to use as dressing. °10. Estimate how many servings of a food you are given. For example,  a serving of cooked rice is ½ cup or about the size of half a baseball. Knowing serving sizes will help you be aware of how much food you are eating at restaurants. The list below tells you how big or small some common portion sizes are based on everyday objects: °? 1 oz--4 stacked dice. °? 3 oz--1 deck of cards. °? 1 tsp--1 die. °? 1 Tbsp--½ a ping-pong ball. °? 2 Tbsp--1 ping-pong ball. °? ½ cup--½ baseball. °? 1 cup--1 baseball. °Summary °· Calorie counting means keeping track of how many calories you eat and drink each day. If you eat fewer calories than your body needs, you should lose weight. °· A healthy amount of weight to lose per week is usually 1-2 lb (0.5-0.9 kg). This usually means reducing your daily calorie intake by 500-750 calories. °· The number of calories in a food can be found on a Nutrition Facts label. If a food does not have a Nutrition Facts label, try to look up the calories online or ask your dietitian for help. °· Use your calories on foods and drinks that will fill you up, and not on foods and drinks that will leave you hungry. °· Use smaller plates, glasses, and bowls to prevent overeating. °This information is not intended to replace advice given to you by your health care provider. Make sure you discuss any questions you have with your health care provider. ° °Document Revised: 11/23/2017 Document Reviewed: 02/04/2016 °Elsevier Patient Education © 2020 [employer] ° ° °

## 2019-10-28 NOTE — Progress Notes (Signed)
PATIENT ID: Jenna Love, female     DOB: [birth year], 59 y.o.     MRN: [id number]   She is scheduled for a Robotic assisted total hysterectomy with bilateral salpingectomy on 11/25/2019.   Patient given informed consent, signed copy in the chart, time out was performed. Appropriate time out taken. . The patient was placed in the lithotomy position and the cervix brought into view with sterile speculum.  Portio of cervix cleansed x 2 with betadine swabs.  A tenaculum was placed in the anterior lip of the cervix.  The uterus was sounded for depth of 7 cm in the antiflex position. A pipelle was introduced to into the uterus, suction created,  and an endometrial sample was obtained. All equipment was removed and accounted for.  The patient tolerated the procedure well.   Patient given post procedure instructions. The patient will return in 2 weeks for results.    By signing my name below, I, [employer], attest that this documentation has been prepared under the direction and in the presence of Tilda Burrow, MD. Electronically Signed: Mal Misty Medical Scribe. 10/28/19. 8:55 AM.  I personally performed the services described in this documentation, which was SCRIBED in my presence. The recorded information has been reviewed and considered accurate. It has been edited as necessary during review. Tilda Burrow, MD

## 2019-10-29 ENCOUNTER — Ambulatory Visit: Payer: Managed Care, Other (non HMO) | Admitting: Gastroenterology

## 2019-10-30 ENCOUNTER — Ambulatory Visit (HOSPITAL_COMMUNITY): Admit: 2019-10-30 | Payer: Managed Care, Other (non HMO) | Admitting: Internal Medicine

## 2019-10-30 ENCOUNTER — Encounter (HOSPITAL_COMMUNITY): Payer: Self-pay

## 2019-10-30 ENCOUNTER — Other Ambulatory Visit: Payer: Self-pay | Admitting: Obstetrics and Gynecology

## 2019-10-30 DIAGNOSIS — R102 Pelvic and perineal pain: Secondary | ICD-10-CM

## 2019-10-30 SURGERY — COLONOSCOPY WITH PROPOFOL
Anesthesia: Monitor Anesthesia Care

## 2019-10-30 NOTE — Progress Notes (Signed)
Benign endometrial biopsy, will share with Dr Erin Fulling. Pt aware.

## 2019-10-30 NOTE — Telephone Encounter (Signed)
1.  Cavity is informed that the endometrial biopsy was completely benign.  She is reassured 2.  Olegario Messier reports that the pelvic pressure and discomfort that was so severe earlier this year is not as significant and she is having some second thoughts on proceeding with surgery.  Of interest the suspected lower uterine segment cystic area was not encountered during the endometrial biopsy as an obstacle to endometrial biopsy, so I think it is reasonable to get a repeat transvaginal ultrasound to see if there was a nabothian cyst or something that has resolved and to perform dynamic ultrasound during the visit to make sure that there is not significant pain upon movement of the cervix on transvaginal ultrasound. I will schedule an ultrasound within a week at the office, discussed with patient, and keep Dr. Erin Fulling informed

## 2019-10-31 ENCOUNTER — Other Ambulatory Visit: Payer: Self-pay

## 2019-10-31 ENCOUNTER — Telehealth (INDEPENDENT_AMBULATORY_CARE_PROVIDER_SITE_OTHER): Payer: Managed Care, Other (non HMO) | Admitting: Obstetrics & Gynecology

## 2019-10-31 ENCOUNTER — Encounter: Payer: Self-pay | Admitting: Obstetrics & Gynecology

## 2019-10-31 DIAGNOSIS — R9389 Abnormal findings on diagnostic imaging of other specified body structures: Secondary | ICD-10-CM | POA: Diagnosis not present

## 2019-10-31 DIAGNOSIS — R102 Pelvic and perineal pain: Secondary | ICD-10-CM | POA: Diagnosis not present

## 2019-10-31 NOTE — Progress Notes (Signed)
TELEHEALTH GYNECOLOGY VISIT ENCOUNTER NOTE  I connected with Jenna Love on 10/31/19 at 11:00 AM EDT by telephone at home and verified that I am speaking with the correct person using two identifiers. Pt was at home and I was at the [facility] office.    I discussed the limitations, risks, security and privacy concerns of performing an evaluation and management service by telephone and the availability of in person appointments. I also discussed with the patient that there may be a patient responsible charge related to this service. The patient expressed understanding and agreed to proceed.   History:  Jenna Love is a 59 y.o. G26P2002 female being evaluated today for Pelvic pain and thickened endometrium.  Her visit was for a preop discussion. She was Pt reports that her pelvic pain is improved.  She denies any abnormal vaginal discharge or bleeding or other concerns.  She has an endo bx by Dr. Erma Pinto that was neg.     Past Medical History:  Diagnosis Date  . Anxiety   . Panic attacks   . Pneumonia   . Thyroid nodule 2013   Benign biopsy- ENT Dr. Pollyann Kennedy   Past Surgical History:  Procedure Laterality Date  . CESAREAN SECTION  1992  . COLONOSCOPY WITH PROPOFOL N/A 11/05/2014   One 5 mm sessile polyp in base of cecum. otherwise normal. fecal material on path. Surveillance in 5 years.   Marland Kitchen HYSTEROSCOPY WITH D & C N/A 10/03/2013   Procedure: DILATATION AND CURETTAGE /HYSTEROSCOPY;  Surgeon: Tilda Burrow, MD;  Location: [facility];  Service: Gynecology;  Laterality: N/A;  . POLYPECTOMY N/A 10/03/2013   Procedure: REMOVAL OF ENDOMETRIAL POLYP;  Surgeon: Tilda Burrow, MD;  Location: [facility];  Service: Gynecology;  Laterality: N/A;  . POLYPECTOMY N/A 11/05/2014   Procedure: POLYPECTOMY;  Surgeon: Corbin Ade, MD;  Location: [facility];  Service: Endoscopy;  Laterality: N/A;  . uterus polyp     The following portions of the patient's history were reviewed and updated as  appropriate: allergies, current medications, past family history, past medical history, past social history, past surgical history and problem list.    Review of Systems:  Pertinent items noted in HPI and remainder of comprehensive ROS otherwise negative.  Physical Exam:   General:  Alert, oriented and cooperative.   Mental Status: Normal mood and affect perceived. Normal judgment and thought content.  Physical exam deferred due to nature of the encounter  Labs and Imaging  Assessment and Plan:     Chronic pelvic pain and thickened endometrium  Pts pelvic pain has resolved and her endo bx was neg. Given the thickness of her endometrium, I still rec a hysteroscopy. That does not need to be done by me. I have discussed with her why I believe its indicated and she agrees. She is happy to have Dr. Emelda Fear or myself perform this procedure. She is concerned about the timing with his upcoming retirement.   I would also consider a repeat US. I initially told her this ofc would schedule it.   I was able to contact Dr. Emelda Fear and he reports that he is able to get her in for surgery next week closer to her home. He will contact her. .       I discussed the assessment and treatment plan with the patient. The patient was provided an opportunity to ask questions and all were answered. The patient agreed with the plan and demonstrated an understanding of the  instructions. I have cancelled her hysterectomy.    The patient was advised to call back or seek an in-person evaluation/go to the ED if the symptoms worsen or if the condition fails to improve as anticipated.  I provided 30 minutes of non-face-to-face time during this encounter.   Willodean Rosenthal, MD [facility] [employer], [facility]

## 2019-11-03 ENCOUNTER — Other Ambulatory Visit: Payer: Self-pay | Admitting: Obstetrics and Gynecology

## 2019-11-03 NOTE — Patient Instructions (Signed)
Jenna Love  11/03/2019     @PREFPERIOPPHARMACY @   Your procedure is scheduled on  11/05/2019.  Report to [facility] at  0900  A.M.  Call this number if you have problems the morning of surgery:  [phone]   Remember:  Do not eat or drink after midnight.                       Take these medicines the morning of surgery with A SIP OF WATER  Clonazepam, toradol (if needed), zoloft.    Do not wear jewelry, make-up or nail polish.  Do not wear lotions, powders, or perfumes. Please wear deodorant and brush your teeth.  Do not shave 48 hours prior to surgery.  Men may shave face and neck.  Do not bring valuables to the hospital.  [facility] is not responsible for any belongings or valuables.  Contacts, dentures or bridgework may not be worn into surgery.  Leave your suitcase in the car.  After surgery it may be brought to your room.  For patients admitted to the hospital, discharge time will be determined by your treatment team.  Patients discharged the day of surgery will not be allowed to drive home.   Name and phone number of your driver:   family Special instructions:  DO NOT smoke the morning of your procedure.  Please read over the following fact sheets that you were given. Anesthesia Post-op Instructions and Care and Recovery After Surgery       Dilation and Curettage or Vacuum Curettage, Care After These instructions give you information about caring for yourself after your procedure. Your doctor may also give you more specific instructions. Call your doctor if you have any problems or questions after your procedure. Follow these instructions at home: Activity  Do not drive or use heavy machinery while taking prescription pain medicine.  For 24 hours after your procedure, avoid driving.  Take short walks often, followed by rest periods. Ask your doctor what activities are safe for you. After one or two days, you may be able to return to  your normal activities.  Do not lift anything that is heavier than 10 lb (4.5 kg) until your doctor approves.  For at least 2 weeks, or as long as told by your doctor: ? Do not douche. ? Do not use tampons. ? Do not have sex. General instructions   Take over-the-counter and prescription medicines only as told by your doctor. This is very important if you take blood thinning medicine.  Do not take baths, swim, or use a hot tub until your doctor approves. Take showers instead of baths.  Wear compression stockings as told by your doctor.  It is up to you to get the results of your procedure. Ask your doctor when your results will be ready.  Keep all follow-up visits as told by your doctor. This is important. Contact a doctor if:  You have very bad cramps that get worse or do not get better with medicine.  You have very bad pain in your belly (abdomen).  You cannot drink fluids without throwing up (vomiting).  You get pain in a different part of the area between your belly and thighs (pelvis).  You have bad-smelling discharge from your vagina.  You have a rash. Get help right away if:  You are bleeding a lot from your vagina. A lot of bleeding means soaking more  than one sanitary pad in an hour, for 2 hours in a row.  You have clumps of blood (blood clots) coming from your vagina.  You have a fever or chills.  Your belly feels very tender or hard.  You have chest pain.  You have trouble breathing.  You cough up blood.  You feel dizzy.  You feel light-headed.  You pass out (faint).  You have pain in your neck or shoulder area. Summary  Take short walks often, followed by rest periods. Ask your doctor what activities are safe for you. After one or two days, you may be able to return to your normal activities.  Do not lift anything that is heavier than 10 lb (4.5 kg) until your doctor approves.  Do not take baths, swim, or use a hot tub until your doctor  approves. Take showers instead of baths.  Contact your doctor if you have any symptoms of infection, like bad-smelling discharge from your vagina. This information is not intended to replace advice given to you by your health care provider. Make sure you discuss any questions you have with your health care provider. Document Revised: 02/16/2017 Document Reviewed: 11/22/2015 Elsevier Patient Education  [place].  Hysteroscopy, Care After This sheet gives you information about how to care for yourself after your procedure. Your health care provider may also give you more specific instructions. If you have problems or questions, contact your health care provider. What can I expect after the procedure? After the procedure, it is common to have:  Cramping.  Bleeding. This can vary from light spotting to menstrual-like bleeding. Follow these instructions at home: Activity  Rest for 1-2 days after the procedure.  Do not douche, use tampons, or have sex for 2 weeks after the procedure, or until your health care provider approves.  Do not drive for 24 hours after the procedure, or for as long as told by your health care provider.  Do not drive, use heavy machinery, or drink alcohol while taking prescription pain medicines. Medicines   Take over-the-counter and prescription medicines only as told by your health care provider.  Do not take aspirin during recovery. It can increase the risk of bleeding. General instructions  Do not take baths, swim, or use a hot tub until your health care provider approves. Take showers instead of baths for 2 weeks, or for as long as told by your health care provider.  To prevent or treat constipation while you are taking prescription pain medicine, your health care provider may recommend that you: ? Drink enough fluid to keep your urine clear or pale yellow. ? Take over-the-counter or prescription medicines. ? Eat foods that are high in fiber,  such as fresh fruits and vegetables, whole grains, and beans. ? Limit foods that are high in fat and processed sugars, such as fried and sweet foods.  Keep all follow-up visits as told by your health care provider. This is important. Contact a health care provider if:  You feel dizzy or lightheaded.  You feel nauseous.  You have abnormal vaginal discharge.  You have a rash.  You have pain that does not get better with medicine.  You have chills. Get help right away if:  You have bleeding that is heavier than a normal menstrual period.  You have a fever.  You have pain or cramps that get worse.  You develop new abdominal pain.  You faint.  You have pain in your shoulders.  You have shortness  of breath. Summary  After the procedure, you may have cramping and some vaginal bleeding.  Do not douche, use tampons, or have sex for 2 weeks after the procedure, or until your health care provider approves.  Do not take baths, swim, or use a hot tub until your health care provider approves. Take showers instead of baths for 2 weeks, or for as long as told by your health care provider.  Report any unusual symptoms to your health care provider.  Keep all follow-up visits as told by your health care provider. This is important. This information is not intended to replace advice given to you by your health care provider. Make sure you discuss any questions you have with your health care provider. Document Revised: 02/16/2017 Document Reviewed: 04/04/2016 Elsevier Patient Education  2020 Elsevier Inc.  General Anesthesia, Adult, Care After This sheet gives you information about how to care for yourself after your procedure. Your health care provider may also give you more specific instructions. If you have problems or questions, contact your health care provider. What can I expect after the procedure? After the procedure, the following side effects are common:  Pain or discomfort  at the IV site.  Nausea.  Vomiting.  Sore throat.  Trouble concentrating.  Feeling cold or chills.  Weak or tired.  Sleepiness and fatigue.  Soreness and body aches. These side effects can affect parts of the body that were not involved in surgery. Follow these instructions at home:  For at least 24 hours after the procedure:  Have a responsible adult stay with you. It is important to have someone help care for you until you are awake and alert.  Rest as needed.  Do not: ? Participate in activities in which you could fall or become injured. ? Drive. ? Use heavy machinery. ? Drink alcohol. ? Take sleeping pills or medicines that cause drowsiness. ? Make important decisions or sign legal documents. ? Take care of children on your own. Eating and drinking  Follow any instructions from your health care provider about eating or drinking restrictions.  When you feel hungry, start by eating small amounts of foods that are soft and easy to digest (bland), such as toast. Gradually return to your regular diet.  Drink enough fluid to keep your urine pale yellow.  If you vomit, rehydrate by drinking water, juice, or clear broth. General instructions  If you have sleep apnea, surgery and certain medicines can increase your risk for breathing problems. Follow instructions from your health care provider about wearing your sleep device: ? Anytime you are sleeping, including during daytime naps. ? While taking prescription pain medicines, sleeping medicines, or medicines that make you drowsy.  Return to your normal activities as told by your health care provider. Ask your health care provider what activities are safe for you.  Take over-the-counter and prescription medicines only as told by your health care provider.  If you smoke, do not smoke without supervision.  Keep all follow-up visits as told by your health care provider. This is important. Contact a health care provider  if:  You have nausea or vomiting that does not get better with medicine.  You cannot eat or drink without vomiting.  You have pain that does not get better with medicine.  You are unable to pass urine.  You develop a skin rash.  You have a fever.  You have redness around your IV site that gets worse. Get help right away if:  You  have difficulty breathing.  You have chest pain.  You have blood in your urine or stool, or you vomit blood. Summary  After the procedure, it is common to have a sore throat or nausea. It is also common to feel tired.  Have a responsible adult stay with you for the first 24 hours after general anesthesia. It is important to have someone help care for you until you are awake and alert.  When you feel hungry, start by eating small amounts of foods that are soft and easy to digest (bland), such as toast. Gradually return to your regular diet.  Drink enough fluid to keep your urine pale yellow.  Return to your normal activities as told by your health care provider. Ask your health care provider what activities are safe for you. This information is not intended to replace advice given to you by your health care provider. Make sure you discuss any questions you have with your health care provider. Document Revised: 03/09/2017 Document Reviewed: 10/20/2016 Elsevier Patient Education  2020 [employer]. How to Use Chlorhexidine for Bathing Chlorhexidine gluconate (CHG) is a germ-killing (antiseptic) solution that is used to clean the skin. It can get rid of the bacteria that normally live on the skin and can keep them away for about 24 hours. To clean your skin with CHG, you may be given:  A CHG solution to use in the shower or as part of a sponge bath.  A prepackaged cloth that contains CHG. Cleaning your skin with CHG may help lower the risk for infection:  While you are staying in the intensive care unit of the hospital.  If you have a vascular  access, such as a central line, to provide short-term or long-term access to your veins.  If you have a catheter to drain urine from your bladder.  If you are on a ventilator. A ventilator is a machine that helps you breathe by moving air in and out of your lungs.  After surgery. What are the risks? Risks of using CHG include:  A skin reaction.  Hearing loss, if CHG gets in your ears.  Eye injury, if CHG gets in your eyes and is not rinsed out.  The CHG product catching fire. Make sure that you avoid smoking and flames after applying CHG to your skin. Do not use CHG:  If you have a chlorhexidine allergy or have previously reacted to chlorhexidine.  On babies younger than 101 months of age. How to use CHG solution  Use CHG only as told by your health care provider, and follow the instructions on the label.  Use the full amount of CHG as directed. Usually, this is one bottle. During a shower Follow these steps when using CHG solution during a shower (unless your health care provider gives you different instructions): 1. Start the shower. 2. Use your normal soap and shampoo to wash your face and hair. 3. Turn off the shower or move out of the shower stream. 4. Pour the CHG onto a clean washcloth. Do not use any type of brush or rough-edged sponge. 5. Starting at your neck, lather your body down to your toes. Make sure you follow these instructions: ? If you will be having surgery, pay special attention to the part of your body where you will be having surgery. Scrub this area for at least 1 minute. ? Do not use CHG on your head or face. If the solution gets into your ears or eyes, rinse them  well with water. ? Avoid your genital area. ? Avoid any areas of skin that have broken skin, cuts, or scrapes. ? Scrub your back and under your arms. Make sure to wash skin folds. 6. Let the lather sit on your skin for 1-2 minutes or as long as told by your health care provider. 7. Thoroughly  rinse your entire body in the shower. Make sure that all body creases and crevices are rinsed well. 8. Dry off with a clean towel. Do not put any substances on your body afterward--such as powder, lotion, or perfume--unless you are told to do so by your health care provider. Only use lotions that are recommended by the manufacturer. 9. Put on clean clothes or pajamas. 10. If it is the night before your surgery, sleep in clean sheets.  During a sponge bath Follow these steps when using CHG solution during a sponge bath (unless your health care provider gives you different instructions): 1. Use your normal soap and shampoo to wash your face and hair. 2. Pour the CHG onto a clean washcloth. 3. Starting at your neck, lather your body down to your toes. Make sure you follow these instructions: ? If you will be having surgery, pay special attention to the part of your body where you will be having surgery. Scrub this area for at least 1 minute. ? Do not use CHG on your head or face. If the solution gets into your ears or eyes, rinse them well with water. ? Avoid your genital area. ? Avoid any areas of skin that have broken skin, cuts, or scrapes. ? Scrub your back and under your arms. Make sure to wash skin folds. 4. Let the lather sit on your skin for 1-2 minutes or as long as told by your health care provider. 5. Using a different clean, wet washcloth, thoroughly rinse your entire body. Make sure that all body creases and crevices are rinsed well. 6. Dry off with a clean towel. Do not put any substances on your body afterward--such as powder, lotion, or perfume--unless you are told to do so by your health care provider. Only use lotions that are recommended by the manufacturer. 7. Put on clean clothes or pajamas. 8. If it is the night before your surgery, sleep in clean sheets. How to use CHG prepackaged cloths  Only use CHG cloths as told by your health care provider, and follow the instructions  on the label.  Use the CHG cloth on clean, dry skin.  Do not use the CHG cloth on your head or face unless your health care provider tells you to.  When washing with the CHG cloth: ? Avoid your genital area. ? Avoid any areas of skin that have broken skin, cuts, or scrapes. Before surgery Follow these steps when using a CHG cloth to clean before surgery (unless your health care provider gives you different instructions): 1. Using the CHG cloth, vigorously scrub the part of your body where you will be having surgery. Scrub using a back-and-forth motion for 3 minutes. The area on your body should be completely wet with CHG when you are done scrubbing. 2. Do not rinse. Discard the cloth and let the area air-dry. Do not put any substances on the area afterward, such as powder, lotion, or perfume. 3. Put on clean clothes or pajamas. 4. If it is the night before your surgery, sleep in clean sheets.  For general bathing Follow these steps when using CHG cloths for general bathing (unless  your health care provider gives you different instructions). 1. Use a separate CHG cloth for each area of your body. Make sure you wash between any folds of skin and between your fingers and toes. Wash your body in the following order, switching to a new cloth after each step: ? The front of your neck, shoulders, and chest. ? Both of your arms, under your arms, and your hands. ? Your stomach and groin area, avoiding the genitals. ? Your right leg and foot. ? Your left leg and foot. ? The back of your neck, your back, and your buttocks. 2. Do not rinse. Discard the cloth and let the area air-dry. Do not put any substances on your body afterward--such as powder, lotion, or perfume--unless you are told to do so by your health care provider. Only use lotions that are recommended by the manufacturer. 3. Put on clean clothes or pajamas. Contact a health care provider if:  Your skin gets irritated after  scrubbing.  You have questions about using your solution or cloth. Get help right away if:  Your eyes become very red or swollen.  Your eyes itch badly.  Your skin itches badly and is red or swollen.  Your hearing changes.  You have trouble seeing.  You have swelling or tingling in your mouth or throat.  You have trouble breathing.  You swallow any chlorhexidine. Summary  Chlorhexidine gluconate (CHG) is a germ-killing (antiseptic) solution that is used to clean the skin. Cleaning your skin with CHG may help to lower your risk for infection.  You may be given CHG to use for bathing. It may be in a bottle or in a prepackaged cloth to use on your skin. Carefully follow your health care provider's instructions and the instructions on the product label.  Do not use CHG if you have a chlorhexidine allergy.  Contact your health care provider if your skin gets irritated after scrubbing. This information is not intended to replace advice given to you by your health care provider. Make sure you discuss any questions you have with your health care provider. Document Revised: 05/23/2018 Document Reviewed: 02/01/2017 Elsevier Patient Education  2020 [employer].

## 2019-11-03 NOTE — Progress Notes (Signed)
Jenna Love is an 59 y.o. female. She is admitted for hysteroscopy, Dilation And Curettage, after a gyn evaluation previously showing a nodule of tissue in the lower uterine segment that was associated with pelvic discomfort and prevented endometrial sampling. Recent attempt at Endometrial biopsy was successful, and the sample was benign,. Dr Erin Fulling was concerned that there might be attitional endometrial tissue that warranted sampling, and she recommends hysteroscopy Dilation and curettage, which I am able to schedule promptly.  The procedure has been reviewed with Jenna Love , including risks of complications , including but not limited to bleeding, uterine perforation, injury to adjacent organs. Pt desires to proceed in order to clarify any remaining question of endometrial pathology.  Pertinent Gynecological History: Menses: post-menopausal Bleeding: post menopausal bleeding Contraception: post menopausal status DES exposure: unknown Blood transfusions: none Sexually transmitted diseases: no past history Previous GYN Procedures: endometrial biopsy  Last mammogram: normal Date: 08/23/2016 Last pap: normal Date: 10/22/2018 OB History: G [id number]   Menstrual History: Menarche age:  Patient's last menstrual period was 05/25/2014.    Past Medical History:  Diagnosis Date  . Anxiety   . Panic attacks   . Pneumonia   . Thyroid nodule 2013   Benign biopsy- ENT Dr. Pollyann Kennedy    Past Surgical History:  Procedure Laterality Date  . CESAREAN SECTION  1992  . COLONOSCOPY WITH PROPOFOL N/A 11/05/2014   One 5 mm sessile polyp in base of cecum. otherwise normal. fecal material on path. Surveillance in 5 years.   Marland Kitchen HYSTEROSCOPY WITH D & C N/A 10/03/2013   Procedure: DILATATION AND CURETTAGE /HYSTEROSCOPY;  Surgeon: Tilda Burrow, MD;  Location: [facility];  Service: Gynecology;  Laterality: N/A;  . POLYPECTOMY N/A 10/03/2013   Procedure: REMOVAL OF ENDOMETRIAL POLYP;  Surgeon:  Tilda Burrow, MD;  Location: [facility];  Service: Gynecology;  Laterality: N/A;  . POLYPECTOMY N/A 11/05/2014   Procedure: POLYPECTOMY;  Surgeon: Corbin Ade, MD;  Location: [facility];  Service: Endoscopy;  Laterality: N/A;  . uterus polyp      Family History  Problem Relation Age of Onset  . Diabetes Mother   . Alzheimer's disease Mother   . Cancer Father        lung   . Cancer Paternal Grandfather   . Hodgkin's lymphoma Son   . Colon cancer Neg Hx   . Colon polyps Neg Hx     Social History:  reports that she has never smoked. She has never used smokeless tobacco. She reports that she does not drink alcohol and does not use drugs.  Allergies: No Known Allergies  (Not in a hospital admission)   Review of Systems  Last menstrual period 05/25/2014. Physical Exam Constitutional:      Appearance: Normal appearance.  HENT:     Head: Normocephalic.  Cardiovascular:     Rate and Rhythm: Normal rate.     Pulses: Normal pulses.  Pulmonary:     Effort: Pulmonary effort is normal.  Abdominal:     General: There is no distension.     Palpations: Abdomen is soft.     Tenderness: There is no abdominal tenderness.     Hernia: No hernia is present.  Genitourinary:    General: Normal vulva.     Comments: Normal ext genitalia, adequate vaginal length, cervix difficult to access due to position behind symphysis pubis, requiring the speculum to be long, Snowman , and be inserted upside down. Uterus sounded to 7 cm and normal  sample obtained. Musculoskeletal:     Cervical back: Normal range of motion.  Neurological:     Mental Status: She is alert.     CBC Latest Ref Rng & Units 10/15/2019 11/11/2018 10/01/2018  WBC 3.8 - 10.8 Thousand/uL 4.5 5.5 6.3  Hemoglobin 11.7 - 15.5 g/dL 36.6 29.4 76.5  Hematocrit 35 - 45 % 41.9 38.4 42.6  Platelets 140 - 400 Thousand/uL 262 230 249    CMP Latest Ref Rng & Units 10/15/2019 11/11/2018 10/01/2018  Glucose 65 - 99 mg/dL 465(K) 354(S) 90  BUN 7  - 25 mg/dL 24 17 19   Creatinine 0.50 - 1.05 mg/dL 5.68 1.27  Sodium 135 - 146 mmol/L 137 140 138  Potassium 3.5 - 5.3 mmol/L 4.5 3.8 4.0  Chloride 98 - 110 mmol/L 101 108 104  CO2 20 - 32 mmol/L 28 26 27   Calcium 8.6 - 10.4 mg/dL 9.4 8.8 9.2  Total Protein 6.1 - 8.1 g/dL 7.7 7.0 7.6  Total Bilirubin 0.2 - 1.2 mg/dL 1.1 0.8 0.6  Alkaline Phos 38 - 126 U/L - - -  AST 10 - 35 U/L 26 18 25   ALT 6 - 29 U/L 24 19 24    Diagnosis of recent endometrial biopsy: Endometrium, biopsy - SCANT ATROPHIC ENDOMETRIUM WITH BREAKDOWN, FRAGMENTED ATROPHIC SQUAMOUS MUCOSA AND BENIGN ENDOCERVICAL CELLS IN A BACKGROUND OF ABUNDANT RED BLOOD CELLS, MUCOID AND INFLAMMATORY MATERIAL. - NO ENDOMETRIAL HYPERPLASIA, CERVICAL DYSPLASIA OR MALIGNANCY IDENTIFIED. 5.17 MD Pathologist, Electronic Signature (Case signed 10/29/2019) Specimen Gross and Clinical Information Specimen Comment   No results found.  Assessment/Plan: Hx Postmenopausal bleeding.Thickened endometrium Hx pelvic pressure, resolved Postmenopausal bleeding.  PLAN;   To have hysteroscopy, dilation and curettage, on 11/05/2019.   11/03/2019, 6:54 PM

## 2019-11-03 NOTE — Telephone Encounter (Signed)
Pt unavailable.  Given my cell # for her to call tonight re" Wednesday Surgery., Hysteroscopy D&C.

## 2019-11-04 ENCOUNTER — Other Ambulatory Visit: Payer: Self-pay

## 2019-11-04 ENCOUNTER — Encounter (HOSPITAL_COMMUNITY): Payer: Self-pay

## 2019-11-04 ENCOUNTER — Encounter (HOSPITAL_COMMUNITY)
Admission: RE | Admit: 2019-11-04 | Discharge: 2019-11-04 | Disposition: A | Discharge status: Home / Self Care | Payer: Managed Care, Other (non HMO) | Source: Ambulatory Visit | Attending: Obstetrics and Gynecology | Admitting: Obstetrics and Gynecology

## 2019-11-04 ENCOUNTER — Other Ambulatory Visit (HOSPITAL_COMMUNITY)
Admission: RE | Admit: 2019-11-04 | Discharge: 2019-11-04 | Disposition: A | Discharge status: Home / Self Care | Payer: Managed Care, Other (non HMO) | Source: Ambulatory Visit | Attending: Obstetrics and Gynecology | Admitting: Obstetrics and Gynecology

## 2019-11-04 DIAGNOSIS — Z20822 Contact with and (suspected) exposure to covid-19: Secondary | ICD-10-CM | POA: Insufficient documentation

## 2019-11-04 DIAGNOSIS — Z01812 Encounter for preprocedural laboratory examination: Secondary | ICD-10-CM | POA: Insufficient documentation

## 2019-11-04 LAB — COMPREHENSIVE METABOLIC PANEL
ALT: 31 U/L (ref 0–44)
AST: 31 U/L (ref 15–41)
Albumin: 4 g/dL (ref 3.5–5.0)
Alkaline Phosphatase: 21 U/L — ABNORMAL LOW (ref 38–126)
Anion gap: 9 (ref 5–15)
BUN: 20 mg/dL (ref 6–20)
CO2: 23 mmol/L (ref 22–32)
Calcium: 8.8 mg/dL — ABNORMAL LOW (ref 8.9–10.3)
Chloride: 105 mmol/L (ref 98–111)
Creatinine, Ser: 0.74 mg/dL (ref 0.44–1.00)
GFR calc Af Amer: >60 mL/min (ref >60)
GFR calc non Af Amer: >60 mL/min (ref >60)
Glucose, Bld: 110 mg/dL — ABNORMAL HIGH (ref 70–99)
Potassium: 3.8 mmol/L (ref 3.5–5.1)
Sodium: 137 mmol/L (ref 135–145)
Total Bilirubin: 0.9 mg/dL (ref 0.3–1.2)
Total Protein: 7.8 g/dL (ref 6.5–8.1)

## 2019-11-04 LAB — URINALYSIS, ROUTINE W REFLEX MICROSCOPIC
Bilirubin Urine: NEGATIVE
Glucose, UA: NEGATIVE mg/dL
Hgb urine dipstick: NEGATIVE
Ketones, ur: NEGATIVE mg/dL
Nitrite: POSITIVE — AB
Protein, ur: NEGATIVE mg/dL
Specific Gravity, Urine: 1.014 (ref 1.005–1.030)
WBC, UA: >50 WBC/hpf — ABNORMAL HIGH (ref 0–5)
pH: 7 (ref 5.0–8.0)

## 2019-11-04 LAB — TYPE AND SCREEN
ABO/RH(D): A POS
Antibody Screen: NEGATIVE

## 2019-11-04 LAB — CBC
HCT: 40.2 % (ref 36.0–46.0)
Hemoglobin: 13.2 g/dL (ref 12.0–15.0)
MCH: 29.9 pg (ref 26.0–34.0)
MCHC: 32.8 g/dL (ref 30.0–36.0)
MCV: 91 fL (ref 80.0–100.0)
Platelets: 214 10*3/uL (ref 150–400)
RBC: 4.42 MIL/uL (ref 3.87–5.11)
RDW: 12.6 % (ref 11.5–15.5)
WBC: 4.4 10*3/uL (ref 4.0–10.5)
nRBC: 0 % (ref 0.0–0.2)

## 2019-11-04 LAB — HCG, SERUM, QUALITATIVE: Preg, Serum: NEGATIVE

## 2019-11-04 LAB — SARS CORONAVIRUS 2 (TAT 6-24 HRS): SARS Coronavirus 2: NEGATIVE

## 2019-11-05 ENCOUNTER — Ambulatory Visit (HOSPITAL_COMMUNITY): Payer: Managed Care, Other (non HMO) | Admitting: Anesthesiology

## 2019-11-05 ENCOUNTER — Encounter (HOSPITAL_COMMUNITY): Payer: Self-pay | Admitting: Obstetrics and Gynecology

## 2019-11-05 ENCOUNTER — Ambulatory Visit (HOSPITAL_COMMUNITY)
Admission: RE | Admit: 2019-11-05 | Discharge: 2019-11-05 | Disposition: A | Discharge status: Home / Self Care | Payer: Managed Care, Other (non HMO) | Attending: Obstetrics and Gynecology | Admitting: Obstetrics and Gynecology

## 2019-11-05 ENCOUNTER — Encounter (HOSPITAL_COMMUNITY)
Admission: RE | Disposition: A | Discharge status: Home / Self Care | Payer: Self-pay | Source: Home / Self Care | Attending: Obstetrics and Gynecology

## 2019-11-05 DIAGNOSIS — Z6841 Body Mass Index (BMI) 40.0 and over, adult: Secondary | ICD-10-CM | POA: Diagnosis not present

## 2019-11-05 DIAGNOSIS — N840 Polyp of corpus uteri: Secondary | ICD-10-CM | POA: Insufficient documentation

## 2019-11-05 DIAGNOSIS — A6004 Herpesviral vulvovaginitis: Secondary | ICD-10-CM | POA: Diagnosis not present

## 2019-11-05 DIAGNOSIS — N950 Postmenopausal bleeding: Secondary | ICD-10-CM | POA: Insufficient documentation

## 2019-11-05 DIAGNOSIS — R9389 Abnormal findings on diagnostic imaging of other specified body structures: Secondary | ICD-10-CM | POA: Diagnosis present

## 2019-11-05 HISTORY — PX: HYSTEROSCOPY WITH D & C: SHX1775

## 2019-11-05 HISTORY — PX: VULVA /PERINEUM BIOPSY: SHX319

## 2019-11-05 LAB — HEMOGLOBIN A1C
Hgb A1c MFr Bld: 5.5 % (ref 4.8–5.6)
Mean Plasma Glucose: 111.15 mg/dL

## 2019-11-05 LAB — ABO/RH: ABO/RH(D): A POS

## 2019-11-05 SURGERY — DILATATION AND CURETTAGE /HYSTEROSCOPY
Anesthesia: General | Site: Vulva | Laterality: Right

## 2019-11-05 MED ORDER — MIDAZOLAM HCL 2 MG/2ML IJ SOLN
INTRAMUSCULAR | Status: DC | PRN
  Administered 2019-11-05: 2 mg via INTRAVENOUS

## 2019-11-05 MED ORDER — CHLORHEXIDINE GLUCONATE 0.12 % MT SOLN
15.0000 mL | Freq: Once | OROMUCOSAL | Status: AC
  Administered 2019-11-05: 15 mL via OROMUCOSAL
  Filled 2019-11-05: qty 15

## 2019-11-05 MED ORDER — SUCCINYLCHOLINE CHLORIDE 20 MG/ML IJ SOLN
INTRAMUSCULAR | Status: DC | PRN
  Administered 2019-11-05: 120 mg via INTRAVENOUS

## 2019-11-05 MED ORDER — SODIUM CHLORIDE 0.9 % IR SOLN
Status: DC | PRN
  Administered 2019-11-05: 3000 mL
  Administered 2019-11-05: 1000 mL

## 2019-11-05 MED ORDER — HYDROMORPHONE HCL 1 MG/ML IJ SOLN: 0.2500 mg | INTRAMUSCULAR | Status: DC | PRN

## 2019-11-05 MED ORDER — BACITRACIN-NEOMYCIN-POLYMYXIN 400-5-5000 EX OINT
TOPICAL_OINTMENT | CUTANEOUS | Status: DC | PRN
  Administered 2019-11-05: 1 via TOPICAL

## 2019-11-05 MED ORDER — SODIUM CHLORIDE 0.9 % IV SOLN
2.0000 g | INTRAVENOUS | Status: AC
  Administered 2019-11-05: 2 g via INTRAVENOUS
  Filled 2019-11-05: qty 2

## 2019-11-05 MED ORDER — ORAL CARE MOUTH RINSE: 15.0000 mL | Freq: Once | OROMUCOSAL | Status: AC

## 2019-11-05 MED ORDER — PROMETHAZINE HCL 25 MG/ML IJ SOLN: 6.2500 mg | INTRAMUSCULAR | Status: DC | PRN

## 2019-11-05 MED ORDER — LACTATED RINGERS IV SOLN: INTRAVENOUS | Status: DC | PRN

## 2019-11-05 MED ORDER — LIDOCAINE HCL (CARDIAC) PF 100 MG/5ML IV SOSY
PREFILLED_SYRINGE | INTRAVENOUS | Status: DC | PRN
  Administered 2019-11-05: 80 mg via INTRAVENOUS

## 2019-11-05 MED ORDER — ONDANSETRON HCL 4 MG/2ML IJ SOLN
INTRAMUSCULAR | Status: DC | PRN
  Administered 2019-11-05: 4 mg via INTRAVENOUS

## 2019-11-05 MED ORDER — POVIDONE-IODINE 10 % EX SWAB: 2.0000 "application " | Freq: Once | CUTANEOUS | Status: DC

## 2019-11-05 MED ORDER — MEPERIDINE HCL 50 MG/ML IJ SOLN: 6.2500 mg | INTRAMUSCULAR | Status: DC | PRN

## 2019-11-05 MED ORDER — BUPIVACAINE-EPINEPHRINE (PF) 0.5% -1:200000 IJ SOLN
INTRAMUSCULAR | Status: DC | PRN
  Administered 2019-11-05: 10 mL via PERINEURAL

## 2019-11-05 MED ORDER — FENTANYL CITRATE (PF) 100 MCG/2ML IJ SOLN
INTRAMUSCULAR | Status: DC | PRN
  Administered 2019-11-05 (×2): 50 ug via INTRAVENOUS

## 2019-11-05 MED ORDER — EPHEDRINE SULFATE-NACL 50-0.9 MG/10ML-% IV SOSY
PREFILLED_SYRINGE | INTRAVENOUS | Status: DC | PRN
  Administered 2019-11-05 (×3): 5 mg via INTRAVENOUS

## 2019-11-05 MED ORDER — PROPOFOL 10 MG/ML IV BOLUS
INTRAVENOUS | Status: DC | PRN
  Administered 2019-11-05: 200 mg via INTRAVENOUS

## 2019-11-05 MED ORDER — DEXAMETHASONE SODIUM PHOSPHATE 4 MG/ML IJ SOLN
INTRAMUSCULAR | Status: DC | PRN
  Administered 2019-11-05: 10 mg via INTRAVENOUS

## 2019-11-05 MED ORDER — LACTATED RINGERS IV SOLN
Freq: Once | INTRAVENOUS | Status: AC
  Administered 2019-11-05: 1000 mL via INTRAVENOUS

## 2019-11-05 MED ORDER — BACITRACIN-NEOMYCIN-POLYMYXIN 400-5-5000 EX OINT
TOPICAL_OINTMENT | CUTANEOUS | Status: AC
  Filled 2019-11-05: qty 1

## 2019-11-05 MED ORDER — MIDAZOLAM HCL 2 MG/2ML IJ SOLN
INTRAMUSCULAR | Status: AC
  Filled 2019-11-05: qty 2

## 2019-11-05 MED ORDER — FENTANYL CITRATE (PF) 100 MCG/2ML IJ SOLN
INTRAMUSCULAR | Status: AC
  Filled 2019-11-05: qty 2

## 2019-11-05 MED ORDER — BUPIVACAINE-EPINEPHRINE (PF) 0.5% -1:200000 IJ SOLN
INTRAMUSCULAR | Status: AC
  Filled 2019-11-05: qty 30

## 2019-11-05 SURGICAL SUPPLY — 23 items
CLOTH BEACON ORANGE TIMEOUT ST (SAFETY) ×4 IMPLANT
COVER LIGHT HANDLE STERIS (MISCELLANEOUS) ×8 IMPLANT
COVER WAND RF STERILE (DRAPES) ×4 IMPLANT
DECANTER SPIKE VIAL GLASS SM (MISCELLANEOUS) ×4 IMPLANT
GAUZE 4X4 16PLY RFD (DISPOSABLE) ×4 IMPLANT
GLOVE BIOGEL PI IND STRL 7.0 (GLOVE) ×4 IMPLANT
GLOVE BIOGEL PI IND STRL 9 (GLOVE) ×2 IMPLANT
GLOVE BIOGEL PI INDICATOR 7.0 (GLOVE) ×4
GLOVE BIOGEL PI INDICATOR 9 (GLOVE) ×2
GLOVE ECLIPSE 9.0 STRL (GLOVE) ×4 IMPLANT
GOWN SPEC L3 XXLG W/TWL (GOWN DISPOSABLE) ×4 IMPLANT
GOWN STRL REUS W/TWL LRG LVL3 (GOWN DISPOSABLE) ×4 IMPLANT
INST SET HYSTEROSCOPY (KITS) ×4 IMPLANT
IV NS IRRIG 3000ML ARTHROMATIC (IV SOLUTION) ×4 IMPLANT
KIT TURNOVER KIT A (KITS) ×4 IMPLANT
MANIFOLD NEPTUNE II (INSTRUMENTS) ×4 IMPLANT
NS IRRIG 1000ML POUR BTL (IV SOLUTION) ×4 IMPLANT
PACK PERI GYN (CUSTOM PROCEDURE TRAY) ×4 IMPLANT
PAD ARMBOARD 7.5X6 YLW CONV (MISCELLANEOUS) ×4 IMPLANT
PAD TELFA 3X4 1S STER (GAUZE/BANDAGES/DRESSINGS) ×4 IMPLANT
SET BASIN LINEN APH (SET/KITS/TRAYS/PACK) ×4 IMPLANT
SET CYSTO W/LG BORE CLAMP LF (SET/KITS/TRAYS/PACK) ×4 IMPLANT
SYR CONTROL 10ML LL (SYRINGE) ×4 IMPLANT

## 2019-11-05 NOTE — Op Note (Signed)
11/05/2019  11:16 AM  PATIENT:  Jenna Love  59 y.o. female  PRE-OPERATIVE DIAGNOSIS:  endometrial thickening, vulvar lesion right side.  POST-OPERATIVE DIAGNOSIS:  endometrial thickening due to endometrial polyp,with vulva lession  PROCEDURE:  Procedure(s): DILATATION AND CURETTAGE /HYSTEROSCOPY WITH BIOPSY VULVA LESION (N/A)  SURGEON:  Surgeon(s) and Role:    Tilda Burrow, MD - Primary  PHYSICIAN ASSISTANT:   ASSISTANTS: Laurena Bering, CST  ANESTHESIA:   local and general  EBL:  5 mL   BLOOD ADMINISTERED:none  DRAINS: none   LOCAL MEDICATIONS USED:  MARCAINE    and Amount: 10 ml  SPECIMEN:  Source of Specimen:  1.  Endometrial polyp2.  Right vulvar/thigh biopsy  DISPOSITION OF SPECIMEN:  PATHOLOGY  COUNTS:  YES  TOURNIQUET:  * No tourniquets in log *  DICTATION: .Dragon Dictation  PLAN OF CARE: Discharge to home after PACU  PATIENT DISPOSITION:  PACU - hemodynamically stable.   Indications.  Persistent postmenopausal bleeding and discomfort which has improved recently.  Previous endometrial biopsy was benign.    Delay start of Pharmacological VTE agent (>24hrs) due to surgical blood loss or risk of bleeding: not applicable Details of procedure patient was taken the operating room prepped and draped for vaginal procedure, timeout conducted and procedure confirmed by surgical team.  Ancef 2 g was administered intravenously. Perineum was prepped and draped and speculum inserted.  The speculum could not visualize the cervix.  The speculum was inverted and could not visualize the cervix.  The long snowman speculum was in was attempted to be utilized but occluded seeing the cervix as well.  With the smaller single-sided speculum we were able to identify the cervix enough to get a tenaculum on the anterior lip of the cervix.  We then placed lateral vaginal sidewall retractor and and with careful manipulation finally were able to see the cervix enough to  perform uterine sounding to 7-1/2 cm and then dilate the cervix to 27 [place].  Hysteroscopy was technically challenging but was achieved and visualized a 1 cm endometrial polyp originating from the patient's right cornual area and the endometrial cavity.  Hysteroscopic scissors were used to amputate the polyp.  The remainder of the endometrial cavity was smooth and atrophic.  Specimen was extracted. Specimen was sent for pathology.  Right vulvar biopsy.  The excoriated area with induration on the right gluteal/thigh crease was inspected, and a 1.5 cm x 1 cm ellipse of skin and fatty tissue were sharply excised, and a 2 layer closure performed with 4-0 Vicryl interrupted suture placed as a horizontal mattress as the deep suture and then continuous running 4-0 Vicryl closure of the skin.  Neosporin was applied.  Specimen was sent to pathology as well.  Patient was allowed awaken and go to recovery room in good condition Sponge and needle counts were correct.

## 2019-11-05 NOTE — Anesthesia Procedure Notes (Signed)
Procedure Name: Intubation Date/Time: 11/05/2019 10:11 AM Performed by: Hewitt Blade, CRNA Pre-anesthesia Checklist: Patient identified, Emergency Drugs available, Suction available and Patient being monitored Patient Re-evaluated:Patient Re-evaluated prior to induction Oxygen Delivery Method: Circle system utilized Preoxygenation: Pre-oxygenation with 100% oxygen Induction Type: IV induction Ventilation: Mask ventilation without difficulty Laryngoscope Size: Mac and 3 Grade View: Grade II Tube type: Oral Tube size: 7.0 mm Number of attempts: 1 Airway Equipment and Method: Stylet and Oral airway Placement Confirmation: ETT inserted through vocal cords under direct vision,  positive ETCO2 and breath sounds checked- equal and bilateral Secured at: 21 cm Tube secured with: Tape Dental Injury: Teeth and Oropharynx as per pre-operative assessment

## 2019-11-05 NOTE — Discharge Instructions (Signed)
Hysteroscopy: We were able to identify a small thumbnail sized polyp growing in the upper portion of the uterus and remove it.  This is likely the source of the thickened endometrium seen on ultrasound.  We will run tissue tests on this and know within  72 working hours Hysteroscopy is a procedure that is used to examine the inside of a woman's womb (uterus). This may be done for various reasons, including:  To look for lumps (tumors) and other growths in the uterus.  To evaluate abnormal bleeding, fibroid tumors, polyps, scar tissue (adhesions), or cancer of the uterus.  To determine the cause of an inability to get pregnant (infertility) or repeated losses of pregnancies (miscarriages).  To find a lost IUD (intrauterine device).  To perform a procedure that permanently prevents pregnancy (sterilization). During this procedure, a thin, flexible tube with a small light and camera (hysteroscope) is used to examine the uterus. The camera sends images to a monitor in the room so that your health care provider can view the inside of your uterus. A hysteroscopy should be done right after a menstrual period to make sure that you are not pregnant. Tell a health care provider about:  Any allergies you have.  All medicines you are taking, including vitamins, herbs, eye drops, creams, and over-the-counter medicines.  Any problems you or family members have had with the use of anesthetic medicines.  Any blood disorders you have.  Any surgeries you have had.  Any medical conditions you have.  Whether you are pregnant or may be pregnant. What are the risks? Generally, this is a safe procedure. However, problems may occur, including:  Excessive bleeding.  Infection.  Damage to the uterus or other structures or organs.  Allergic reaction to medicines or fluids that are used in the procedure. What happens before the procedure? Staying hydrated Follow instructions from your health care  provider about hydration, which may include:  Up to 2 hours before the procedure - you may continue to drink clear liquids, such as water, clear fruit juice, black coffee, and plain tea. Eating and drinking restrictions Follow instructions from your health care provider about eating and drinking, which may include:  8 hours before the procedure - stop eating solid foods and drink clear liquids only  2 hours before the procedure - stop drinking clear liquids. General instructions  Ask your health care provider about: ? Changing or stopping your normal medicines. This is important if you take diabetes medicines or blood thinners. ? Taking medicines such as aspirin and ibuprofen. These medicines can thin your blood and cause bleeding. Do not take these medicines for 1 week before your procedure, or as told by your health care provider.  Do not use any products that contain nicotine or tobacco for 2 weeks before the procedure. This includes cigarettes and e-cigarettes. If you need help quitting, ask your health care provider.  Medicine may be placed in your cervix the day before the procedure. This medicine causes the cervix to have a larger opening (dilate). The larger opening makes it easier for the hysteroscope to be inserted into the uterus during the procedure.  Plan to have someone with you for the first 24-48 hours after the procedure, especially if you are given a medicine to make you fall asleep (general anesthetic).  Plan to have someone take you home from the hospital or clinic. What happens during the procedure?  To lower your risk of infection: ? Your health care team will wash  or sanitize their hands. ? Your skin will be washed with soap. ? Hair may be removed from the surgical area.  An IV tube will be inserted into one of your veins.  You may be given one or more of the following: ? A medicine to help you relax (sedative). ? A medicine that numbs the area around the  cervix (local anesthetic). ? A medicine to make you fall asleep (general anesthetic).  A hysteroscope will be inserted through your vagina and into your uterus.  Air or fluid will be used to enlarge your uterus, enabling your health care provider to see your uterus better. The amount of fluid used will be carefully checked throughout the procedure.  In some cases, tissue may be gently scraped from inside the uterus and sent to a lab for testing (biopsy). The procedure may vary among health care providers and hospitals. What happens after the procedure?  Your blood pressure, heart rate, breathing rate, and blood oxygen level will be monitored until the medicines you were given have worn off.  You may have some cramping. You may be given medicines for this.  You may have bleeding, which varies from light spotting to menstrual-like bleeding. This is normal.  If you had a biopsy done, it is your responsibility to get the results of your procedure. Ask your health care provider, or the department performing the procedure, when your results will be ready. Summary  Hysteroscopy is a procedure that is used to examine the inside of a woman's womb (uterus).  After the procedure, you may have bleeding, which varies from light spotting to menstrual-like bleeding. This is normal. You may also have cramping.  Plan to have someone take you home from the hospital or clinic. This information is not intended to replace advice given to you by your health care provider. Make sure you discuss any questions you have with your health care provider. Document Revised: 02/16/2017 Document Reviewed: 04/04/2016 Elsevier Patient Education  2020 [employer] Inc[employer]            General Anesthesia, Adult, Care After This sheet gives you information about how to care for yourself after your procedure. Your health care provider may also give you more specific instructions. If you have problems or questions, contact  your health care provider. What can I expect after the procedure? After the procedure, the following side effects are common:  Pain or discomfort at the IV site.  Nausea.  Vomiting.  Sore throat.  Trouble concentrating.  Feeling cold or chills.  Weak or tired.  Sleepiness and fatigue.  Soreness and body aches. These side effects can affect parts of the body that were not involved in surgery. Follow these instructions at home:  For at least 24 hours after the procedure:  Have a responsible adult stay with you. It is important to have someone help care for you until you are awake and alert.  Rest as needed.  Do not: ? Participate in activities in which you could fall or become injured. ? Drive. ? Use heavy machinery. ? Drink alcohol. ? Take sleeping pills or medicines that cause drowsiness. ? Make important decisions or sign legal documents. ? Take care of children on your own. Eating and drinking  Follow any instructions from your health care provider about eating or drinking restrictions.  When you feel hungry, start by eating small amounts of foods that are soft and easy to digest (bland), such as toast. Gradually return to your regular diet.  Drink enough fluid to keep your urine pale yellow.  If you vomit, rehydrate by drinking water, juice, or clear broth. General instructions  If you have sleep apnea, surgery and certain medicines can increase your risk for breathing problems. Follow instructions from your health care provider about wearing your sleep device: ? Anytime you are sleeping, including during daytime naps. ? While taking prescription pain medicines, sleeping medicines, or medicines that make you drowsy.  Return to your normal activities as told by your health care provider. Ask your health care provider what activities are safe for you.  Take over-the-counter and prescription medicines only as told by your health care provider.  If you smoke, do  not smoke without supervision.  Keep all follow-up visits as told by your health care provider. This is important. Contact a health care provider if:  You have nausea or vomiting that does not get better with medicine.  You cannot eat or drink without vomiting.  You have pain that does not get better with medicine.  You are unable to pass urine.  You develop a skin rash.  You have a fever.  You have redness around your IV site that gets worse. Get help right away if:  You have difficulty breathing.  You have chest pain.  You have blood in your urine or stool, or you vomit blood. Summary  After the procedure, it is common to have a sore throat or nausea. It is also common to feel tired.  Have a responsible adult stay with you for the first 24 hours after general anesthesia. It is important to have someone help care for you until you are awake and alert.  When you feel hungry, start by eating small amounts of foods that are soft and easy to digest (bland), such as toast. Gradually return to your regular diet.  Drink enough fluid to keep your urine pale yellow.  Return to your normal activities as told by your health care provider. Ask your health care provider what activities are safe for you. This information is not intended to replace advice given to you by your health care provider. Make sure you discuss any questions you have with your health care provider. Document Revised: 03/09/2017 Document Reviewed: 10/20/2016 Elsevier Patient Education  2020 [employer].

## 2019-11-05 NOTE — Brief Op Note (Addendum)
11/05/2019  11:16 AM  PATIENT:  Jenna Love  59 y.o. female  PRE-OPERATIVE DIAGNOSIS:  endometrial thickening, vulvar lesion right side.  POST-OPERATIVE DIAGNOSIS:  endometrial thickening due to endometrial polyp,with vulva lession  PROCEDURE:  Procedure(s): DILATATION AND CURETTAGE /HYSTEROSCOPY WITH BIOPSY VULVA LESION (N/A)  SURGEON:  Surgeon(s) and Role:    * Ermine Spofford V, MD - Primary  PHYSICIAN ASSISTANT:   ASSISTANTS: Angela Witt, CST  ANESTHESIA:   local and general  EBL:  5 mL   BLOOD ADMINISTERED:none  DRAINS: none   LOCAL MEDICATIONS USED:  MARCAINE    and Amount: 10 ml  SPECIMEN:  Source of Specimen:  1.  Endometrial polyp2.  Right vulvar/thigh biopsy  DISPOSITION OF SPECIMEN:  PATHOLOGY  COUNTS:  YES  TOURNIQUET:  * No tourniquets in log *  DICTATION: .Dragon Dictation  PLAN OF CARE: Discharge to home after PACU  PATIENT DISPOSITION:  PACU - hemodynamically stable.   Indications.  Persistent postmenopausal bleeding and discomfort which has improved recently.  Previous endometrial biopsy was benign.    Delay start of Pharmacological VTE agent (>24hrs) due to surgical blood loss or risk of bleeding: not applicable Details of procedure patient was taken the operating room prepped and draped for vaginal procedure, timeout conducted and procedure confirmed by surgical team.  Ancef 2 g was administered intravenously. Perineum was prepped and draped and speculum inserted.  The speculum could not visualize the cervix.  The speculum was inverted and could not visualize the cervix.  The long snowman speculum was in was attempted to be utilized but occluded seeing the cervix as well.  With the smaller single-sided speculum we were able to identify the cervix enough to get a tenaculum on the anterior lip of the cervix.  We then placed lateral vaginal sidewall retractor and and with careful manipulation finally were able to see the cervix enough to  perform uterine sounding to 7-1/2 cm and then dilate the cervix to 27 [place].  Hysteroscopy was technically challenging but was achieved and visualized a 1 cm endometrial polyp originating from the patient's right cornual area and the endometrial cavity.  Hysteroscopic scissors were used to amputate the polyp.  The remainder of the endometrial cavity was smooth and atrophic.  Specimen was extracted. Specimen was sent for pathology.  Right vulvar biopsy.  The excoriated area with induration on the right gluteal/thigh crease was inspected, and a 1.5 cm x 1 cm ellipse of skin and fatty tissue were sharply excised, and a 2 layer closure performed with 4-0 Vicryl interrupted suture placed as a horizontal mattress as the deep suture and then continuous running 4-0 Vicryl closure of the skin.  Neosporin was applied.  Specimen was sent to pathology as well.  Patient was allowed awaken and go to recovery room in good condition Sponge and needle counts were correct.    

## 2019-11-05 NOTE — Anesthesia Postprocedure Evaluation (Signed)
Anesthesia Post Note  Patient: Jenna Love  Procedure(s) Performed: DILATATION AND CURETTAGE /HYSTEROSCOPY WITH BIOPSY VULVA LESION (N/A )  Patient location during evaluation: PACU Anesthesia Type: General Level of consciousness: awake, awake and alert and oriented Pain management: pain level controlled Vital Signs Assessment: post-procedure vital signs reviewed and stable Respiratory status: spontaneous breathing, nonlabored ventilation and respiratory function stable Cardiovascular status: stable Postop Assessment: no apparent nausea or vomiting Anesthetic complications: no   No complications documented.   Last Vitals:  Vitals:   11/05/19 0829  BP: 139/80  Pulse: (!) 106  Resp: 20  Temp: 36.8 C  SpO2: 94%    Last Pain:  Vitals:   11/05/19 0829  TempSrc: Oral  PainSc: 0-No pain                 Kamillah Didonato Hristova

## 2019-11-05 NOTE — Progress Notes (Signed)
Please excuse Jenna Love from work until Monday August 23,2021.  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 Per her physician , she may return to work Monday November 10, 2019.

## 2019-11-05 NOTE — Transfer of Care (Signed)
Immediate Anesthesia Transfer of Care Note  Patient: Jenna Love  Procedure(s) Performed: DILATATION AND CURETTAGE /HYSTEROSCOPY WITH BIOPSY VULVA LESION (N/A )  Patient Location: PACU  Anesthesia Type:General  Level of Consciousness: awake, alert  and oriented  Airway & Oxygen Therapy: Patient Spontanous Breathing  Post-op Assessment: Report given to RN, Post -op Vital signs reviewed and stable and Patient moving all extremities  Post vital signs: Reviewed and stable  Last Vitals:  Vitals Value Taken Time  BP 131/68 11/05/19 1106  Temp    Pulse 107 11/05/19 1109  Resp 19 11/05/19 1109  SpO2 93 % 11/05/19 1109  Vitals shown include unvalidated device data.  Last Pain:  Vitals:   11/05/19 0829  TempSrc: Oral  PainSc: 0-No pain      Patients Stated Pain Goal: 5 (11/05/19 0829)  Complications: No complications documented.

## 2019-11-05 NOTE — H&P (Signed)
Signed      Expand All Collapse All  Show:Clear all [x] Manual[x] Template[x] Copied  Added by: [x] , MD  [] Hover for details Jenna Love is an 59 y.o. female. She is admitted for hysteroscopy, Dilation And Curettage, after a gyn evaluation previously showing a nodule of tissue in the lower uterine segment that was associated with pelvic discomfort and prevented endometrial sampling. Recent attempt at Endometrial biopsy was successful, and the sample was benign,. Dr was concerned that there might be attitional endometrial tissue that warranted sampling, and she recommends hysteroscopy Dilation and curettage, which I am able to schedule promptly.  The procedure has been reviewed with Jenna Love , including risks of complications , including but not limited to bleeding, uterine perforation, injury to adjacent organs. Pt desires to proceed in order to clarify any remaining question of endometrial pathology.  Pertinent Gynecological History: Menses: post-menopausal Bleeding: post menopausal bleeding Contraception: post menopausal status DES exposure: unknown Blood transfusions: none Sexually transmitted diseases: no past history Previous GYN Procedures: endometrial biopsy  Last mammogram: normal Date: 08/23/2016 Last pap: normal Date: 10/22/2018 OB History: G G2P2002   Menstrual History: Menarche age:  Patient's last menstrual period was 05/25/2014.      Past Medical History:  Diagnosis Date  . Anxiety   . Panic attacks   . Pneumonia   . Thyroid nodule 2013   Benign biopsy- ENT Dr. 07-19-2003         Past Surgical History:  Procedure Laterality Date  . CESAREAN SECTION  1992  . COLONOSCOPY WITH PROPOFOL N/A 11/05/2014   One 5 mm sessile polyp in base of cecum. otherwise normal. fecal material on path. Surveillance in 5 years.   2014 HYSTEROSCOPY WITH D & C N/A 10/03/2013   Procedure: DILATATION AND CURETTAGE /HYSTEROSCOPY;   Surgeon: 11/07/2014, MD;  Location: [facility];  Service: Gynecology;  Laterality: N/A;  . POLYPECTOMY N/A 10/03/2013   Procedure: REMOVAL OF ENDOMETRIAL POLYP;  Surgeon: 10/05/2013, MD;  Location: [facility];  Service: Gynecology;  Laterality: N/A;  . POLYPECTOMY N/A 11/05/2014   Procedure: POLYPECTOMY;  Surgeon: 10/05/2013, MD;  Location: [facility];  Service: Endoscopy;  Laterality: N/A;  . uterus polyp           Family History  Problem Relation Age of Onset  . Diabetes Mother   . Alzheimer's disease Mother   . Cancer Father        lung   . Cancer Paternal Grandfather   . Hodgkin's lymphoma Son   . Colon cancer Neg Hx   . Colon polyps Neg Hx     Social History:  reports that she has never smoked. She has never used smokeless tobacco. She reports that she does not drink alcohol and does not use drugs.  Allergies: No Known Allergies  (Not in a hospital admission)   Review of Systems  Last menstrual period 05/25/2014. Physical Exam Constitutional:      Appearance: Normal appearance.  HENT:     Head: Normocephalic.  Cardiovascular:     Rate and Rhythm: Normal rate.     Pulses: Normal pulses.  Pulmonary:     Effort: Pulmonary effort is normal.  Abdominal:     General: There is no distension.     Palpations: Abdomen is soft.     Tenderness: There is no abdominal tenderness.     Hernia: No hernia is present.  Genitourinary:    General: Normal vulva.  Comments: Normal ext genitalia, adequate vaginal length, cervix difficult to access due to position behind symphysis pubis, requiring the speculum to be long, Snowman , and be inserted upside down. Uterus sounded to 7 cm and normal sample obtained. Musculoskeletal:     Cervical back: Normal range of motion.  Neurological:     Mental Status: She is alert.     CBC Latest Ref Rng & Units 10/15/2019 11/11/2018 10/01/2018  WBC 3.8 - 10.8 Thousand/uL 4.5 5.5 6.3  Hemoglobin 11.7 - 15.5 g/dL 14.4 31.5  40.0  Hematocrit 35 - 45 % 41.9 38.4 42.6  Platelets 140 - 400 Thousand/uL 262 230 249    CMP Latest Ref Rng & Units 10/15/2019 11/11/2018 10/01/2018  Glucose 65 - 99 mg/dL 867(Y) 195(K) 90  BUN 7 - 25 mg/dL 24 17 19   Creatinine 0.50 - 1.05 mg/dL 9.32 6.71  Sodium 135 - 146 mmol/L 137 140 138  Potassium 3.5 - 5.3 mmol/L 4.5 3.8 4.0  Chloride 98 - 110 mmol/L 101 108 104  CO2 20 - 32 mmol/L 28 26 27   Calcium 8.6 - 10.4 mg/dL 9.4 8.8 9.2  Total Protein 6.1 - 8.1 g/dL 7.7 7.0 7.6  Total Bilirubin 0.2 - 1.2 mg/dL 1.1 0.8 0.6  Alkaline Phos 38 - 126 U/L - - -  AST 10 - 35 U/L 26 18 25   ALT 6 - 29 U/L 24 19 24    Diagnosis of recent endometrial biopsy: Endometrium, biopsy - SCANT ATROPHIC ENDOMETRIUM WITH BREAKDOWN, FRAGMENTED ATROPHIC SQUAMOUS MUCOSA AND BENIGN ENDOCERVICAL CELLS IN A BACKGROUND OF ABUNDANT RED BLOOD CELLS, MUCOID AND INFLAMMATORY MATERIAL. - NO ENDOMETRIAL HYPERPLASIA, CERVICAL DYSPLASIA OR MALIGNANCY IDENTIFIED. 2.45 MD Pathologist, Electronic Signature (Case signed 10/29/2019) Specimen Gross and Clinical Information Specimen Comment   Imaging Results (Last 48 hours)  No results found.    Assessment/Plan: Hx Postmenopausal bleeding.Thickened endometrium Hx pelvic pressure, resolved Postmenopausal bleeding.  PLAN;   To have hysteroscopy, dilation and curettage, on 11/05/2019.   11/03/2019, 6:54 PM        Note Details  12/29/2019, MD File Time 11/03/2019 7:08 PM  Author Type Physician Status Signed  Last Editor Tilda Burrow, MD Service Gynecology  [facility] Acct # [id number] Admit Date 11/05/2019

## 2019-11-05 NOTE — Anesthesia Preprocedure Evaluation (Addendum)
Anesthesia Evaluation  Patient identified by MRN, date of birth, ID band Patient awake    Reviewed: Allergy & Precautions, NPO status , Patient's Chart, lab work & pertinent test results  History of Anesthesia Complications Negative for: history of anesthetic complications  Airway Mallampati: II  TM Distance: >3 FB Neck ROM: Full    Dental  (+) Dental Advisory Given, Teeth Intact   Pulmonary pneumonia, resolved,    Pulmonary exam normal breath sounds clear to auscultation       Cardiovascular Exercise Tolerance: Good Normal cardiovascular exam Rhythm:Regular Rate:Normal     Neuro/Psych PSYCHIATRIC DISORDERS Anxiety Depression    GI/Hepatic negative GI ROS, Neg liver ROS,   Endo/Other  Morbid obesity  Renal/GU negative Renal ROS     Musculoskeletal negative musculoskeletal ROS (+)   Abdominal   Peds  Hematology negative hematology ROS (+)   Anesthesia Other Findings   Reproductive/Obstetrics negative OB ROS                           Anesthesia Physical Anesthesia Plan  ASA: III  Anesthesia Plan: General   Post-op Pain Management:    Induction: Intravenous  PONV Risk Score and Plan: Ondansetron, Dexamethasone and Midazolam  Airway Management Planned: Oral ETT  Additional Equipment:   Intra-op Plan:   Post-operative Plan:   Informed Consent: I have reviewed the patients History and Physical, chart, labs and discussed the procedure including the risks, benefits and alternatives for the proposed anesthesia with the patient or authorized representative who has indicated his/her understanding and acceptance.     Dental advisory given  Plan Discussed with: CRNA and Surgeon  Anesthesia Plan Comments:        Anesthesia Quick Evaluation

## 2019-11-06 ENCOUNTER — Encounter (HOSPITAL_COMMUNITY): Payer: Self-pay | Admitting: Obstetrics and Gynecology

## 2019-11-06 ENCOUNTER — Other Ambulatory Visit (HOSPITAL_BASED_OUTPATIENT_CLINIC_OR_DEPARTMENT_OTHER): Payer: Managed Care, Other (non HMO)

## 2019-11-06 LAB — SURGICAL PATHOLOGY

## 2019-11-07 ENCOUNTER — Other Ambulatory Visit: Payer: Self-pay | Admitting: Obstetrics and Gynecology

## 2019-11-07 MED ORDER — VALACYCLOVIR HCL 1 G PO TABS: 1000.0000 mg | ORAL_TABLET | Freq: Every day | ORAL | 2 refills | Status: DC

## 2019-11-18 ENCOUNTER — Ambulatory Visit (INDEPENDENT_AMBULATORY_CARE_PROVIDER_SITE_OTHER): Payer: Managed Care, Other (non HMO) | Admitting: Obstetrics & Gynecology

## 2019-11-18 ENCOUNTER — Other Ambulatory Visit (HOSPITAL_COMMUNITY): Payer: Managed Care, Other (non HMO)

## 2019-11-18 ENCOUNTER — Encounter: Payer: Self-pay | Admitting: Obstetrics & Gynecology

## 2019-11-18 VITALS — BP 103/71 | HR 92 | Ht 63.0 in | Wt 263.0 lb

## 2019-11-18 DIAGNOSIS — Z9889 Other specified postprocedural states: Secondary | ICD-10-CM

## 2019-11-18 MED ORDER — SILVER SULFADIAZINE 1 % EX CREA
TOPICAL_CREAM | CUTANEOUS | 11 refills | Status: DC
Start: 2019-11-18 — End: 2019-12-24

## 2019-11-18 NOTE — Progress Notes (Signed)
  HPI: Patient returns for routine postoperative follow-up having undergone hysteroscopy uterine curettage on 11/05/19.  The patient's immediate postoperative recovery has been unremarkable. Since hospital discharge the patient reports no problems.   Current Outpatient Medications: Cholecalciferol (VITAMIN D) 2000 units CAPS, Take by mouth., Disp: , Rfl:  Clobetasol Prop Emollient Base 0.05 % emollient cream, Apply bid to affected area for 2 weeks then once daily till appointment, Disp: 30 g, Rfl: 1 clonazePAM (KLONOPIN) 1 MG tablet, TAKE (1) TABLET BY MOUTH THREE TIMES DAILY AS NEEDED ANXIETY., Disp: 90 tablet, Rfl: 0 Dulaglutide (TRULICITY) 0.75 MG/0.5ML SOPN, Inject 0.5 mLs (0.75 mg total) into the skin once a week., Disp: 4 pen, Rfl: 3 sertraline (ZOLOFT) 100 MG tablet, Take 1.5 tablets (150 mg total) by mouth daily., Disp: 135 tablet, Rfl: 1 vitamin E 400 UNIT capsule, Take 400 Units by mouth daily., Disp: , Rfl:  zolpidem (AMBIEN) 10 MG tablet, Take 1 tablet (10 mg total) by mouth at bedtime as needed. for insomnia, Disp: 90 tablet, Rfl: 1 furosemide (LASIX) 20 MG tablet, TAKE (1) TABLET BY MOUTH ONCE daily prn (Patient not taking: Reported on 11/18/2019), Disp: 30 tablet, Rfl: 1 ketorolac (TORADOL) 10 MG tablet, Take 1 tablet (10 mg total) by mouth every 6 (six) hours. For up to 5 days (Patient not taking: Reported on 11/18/2019), Disp: 20 tablet, Rfl: 0 valACYclovir (VALTREX) 1000 MG tablet, Take 1 tablet (1,000 mg total) by mouth daily. (Patient not taking: Reported on 11/18/2019), Disp: 5 tablet, Rfl: 2  No current facility-administered medications for this visit.    Blood pressure 103/71, pulse 92, height 5\' 3"  (1.6 m), weight 263 lb (119.3 kg), last menstrual period 05/25/2014.  Physical Exam: Normal post op visit  Diagnostic Tests:   Pathology: benign  Impression: Normal post op  Plan: Follow up prn  Follow up:     07/25/2014, MD

## 2019-11-19 ENCOUNTER — Other Ambulatory Visit (HOSPITAL_COMMUNITY): Payer: Managed Care, Other (non HMO)

## 2019-11-25 ENCOUNTER — Encounter (HOSPITAL_BASED_OUTPATIENT_CLINIC_OR_DEPARTMENT_OTHER): Payer: Self-pay

## 2019-11-25 ENCOUNTER — Ambulatory Visit (HOSPITAL_BASED_OUTPATIENT_CLINIC_OR_DEPARTMENT_OTHER): Admit: 2019-11-25 | Payer: Managed Care, Other (non HMO) | Admitting: Obstetrics & Gynecology

## 2019-11-25 SURGERY — HYSTERECTOMY, TOTAL, ROBOT-ASSISTED, LAPAROSCOPIC, WITH BILATERAL SALPINGO-OOPHORECTOMY
Anesthesia: Choice | Laterality: Bilateral

## 2019-12-08 ENCOUNTER — Other Ambulatory Visit: Payer: Self-pay | Admitting: Family Medicine

## 2019-12-09 NOTE — Telephone Encounter (Signed)
Requested Prescriptions   Pending Prescriptions Disp Refills   clonazePAM (KLONOPIN) 1 MG tablet [Pharmacy Med Name: CLONAZEPAM 1 MG TABLET] 90 tablet 0    Sig: TAKE 1 TABLET BY MOUTH 3 TIMES A DAY AS NEEDED FOR ANXIETY.     Last OV 10/21/2019   Last written 10/10/2019

## 2019-12-15 ENCOUNTER — Encounter: Payer: Self-pay | Admitting: Family Medicine

## 2019-12-24 ENCOUNTER — Encounter: Payer: Self-pay | Admitting: Family Medicine

## 2019-12-24 ENCOUNTER — Other Ambulatory Visit: Payer: Self-pay

## 2019-12-24 ENCOUNTER — Ambulatory Visit (INDEPENDENT_AMBULATORY_CARE_PROVIDER_SITE_OTHER): Payer: 59 | Admitting: Family Medicine

## 2019-12-24 VITALS — BP 128/66 | HR 70 | Temp 98.1°F | Resp 14 | Ht 63.0 in | Wt 264.0 lb

## 2019-12-24 DIAGNOSIS — J302 Other seasonal allergic rhinitis: Secondary | ICD-10-CM | POA: Diagnosis not present

## 2019-12-24 DIAGNOSIS — E669 Obesity, unspecified: Secondary | ICD-10-CM

## 2019-12-24 DIAGNOSIS — R7303 Prediabetes: Secondary | ICD-10-CM | POA: Diagnosis not present

## 2019-12-24 MED ORDER — TRULICITY 1.5 MG/0.5ML ~~LOC~~ SOAJ
1.5000 mg | SUBCUTANEOUS | 3 refills | Status: DC
Start: 2019-12-24 — End: 2020-05-24

## 2019-12-24 NOTE — Progress Notes (Signed)
   Subjective:    Patient ID: Jenna Love, female    DOB: 27-Feb-1961, 59 y.o.   MRN: [id number]  Patient presents for Medication Management (Trulicity- states that she did loose weight, but then she started to have increased appetitie and gained weight back)  Pt here to discuss medications She has class 3 obesity with borderline DM Last A1C 5.5% in August  Weight at visit 8/314 Initially lost 14lbs in the first , the past 2-3 weeks  [employer] nutrition, she admites to she has been eathing snacks, more sugar foods, snacks  She is till doing the vitamins  She did not have N/V or bowel hcanges with trulcity, felt good on the meds, appetite was controlled then she started feeling hungry again and was eating more candy and off plan  Feels drainage in throat sinec change in weather, doesn't feel ill, no cough, no fever   She has 2nd covid dose of COVID/FLu shot UTD     Review Of Systems:  GEN- denies fatigue, fever, weight loss,weakness, recent illness HEENT- denies eye drainage, change in vision, nasal discharge, CVS- denies chest pain, palpitations RESP- denies SOB, cough, wheeze ABD- denies N/V, change in stools, abd pain GU- denies dysuria, hematuria, dribbling, incontinence MSK- denies joint pain, muscle aches, injury Neuro- denies headache, dizziness, syncope, seizure activity       Objective:    BP 128/66   Pulse 70   Temp 98.1 F (36.7 C) (Temporal)   Resp 14   Ht 5\' 3"  (1.6 m)   Wt 264 lb (119.7 kg)   LMP 05/25/2014   SpO2 98%   BMI 46.77 kg/m  GEN- NAD, alert and oriented x3 HEENT- PERRL, EOMI, non injected sclera, pink conjunctiva, MMM, oropharynx clear, nares clear, no sinus tenderness, TM clear no effusion  Neck- Supple, no thyromegaly CVS- RRR, no murmur RESP-CTAB ABD-NABS,soft,NT,ND EXT- No edema Pulses- Radial, DP- 2+        Assessment & Plan:      Problem List Items Addressed This Visit      Unprioritized   Borderline diabetes -  Primary   Class 3 obesity    Increase trulicity to  1.5mg  once week Return to low carb diet with higher protein She still has the [employer] food from the progam and wants to utilize Increase water in take  F/U 2 months in office  For mild drainage, no sign of infection, likely allergy related, add anti-histamine        Other Visit Diagnoses    Seasonal allergies          Note: This dictation was prepared with Dragon dictation along with smaller phrase technology. Any transcriptional errors that result from this process are unintentional.

## 2019-12-24 NOTE — Patient Instructions (Signed)
F/U  2 months for weight Trulicity increased to 1.5mg  once a week

## 2019-12-24 NOTE — Assessment & Plan Note (Signed)
Increase trulicity to  1.5mg  once week Return to low carb diet with higher protein She still has the [employer] food from the progam and wants to utilize Increase water in take  F/U 2 months in office  For mild drainage, no sign of infection, likely allergy related, add anti-histamine

## 2020-01-05 ENCOUNTER — Encounter: Payer: Self-pay | Admitting: Family Medicine

## 2020-01-06 MED ORDER — CLONAZEPAM 1 MG PO TABS: ORAL_TABLET | ORAL | 2 refills | Status: DC

## 2020-01-06 NOTE — Telephone Encounter (Signed)
Ok to refill??  Last office visit 12/24/2019.  Last refill 12/09/2019.

## 2020-01-19 ENCOUNTER — Encounter: Payer: Self-pay | Admitting: Family Medicine

## 2020-01-19 ENCOUNTER — Telehealth: Payer: Self-pay | Admitting: Internal Medicine

## 2020-01-19 NOTE — Telephone Encounter (Signed)
Please call patient. She is having bloody diarrhea. [phone]

## 2020-01-19 NOTE — Telephone Encounter (Signed)
Patient returned call while nurse was at lunch. I told her that I would let Helmut Muster know.

## 2020-01-19 NOTE — Telephone Encounter (Signed)
Lmom, waiting on a return call.  

## 2020-01-20 ENCOUNTER — Encounter: Payer: Self-pay | Admitting: Family Medicine

## 2020-01-20 ENCOUNTER — Other Ambulatory Visit: Payer: Self-pay

## 2020-01-20 ENCOUNTER — Ambulatory Visit (INDEPENDENT_AMBULATORY_CARE_PROVIDER_SITE_OTHER): Payer: 59 | Admitting: Family Medicine

## 2020-01-20 ENCOUNTER — Telehealth: Payer: Self-pay | Admitting: Internal Medicine

## 2020-01-20 VITALS — BP 118/80 | HR 68 | Resp 10 | Ht 63.0 in | Wt 265.4 lb

## 2020-01-20 DIAGNOSIS — K529 Noninfective gastroenteritis and colitis, unspecified: Secondary | ICD-10-CM

## 2020-01-20 DIAGNOSIS — R197 Diarrhea, unspecified: Secondary | ICD-10-CM

## 2020-01-20 DIAGNOSIS — R112 Nausea with vomiting, unspecified: Secondary | ICD-10-CM

## 2020-01-20 DIAGNOSIS — R1084 Generalized abdominal pain: Secondary | ICD-10-CM | POA: Diagnosis not present

## 2020-01-20 LAB — CBC WITH DIFFERENTIAL/PLATELET
Absolute Monocytes: 590 cells/uL (ref 200–950)
Basophils Absolute: 16 cells/uL (ref 0–200)
Basophils Relative: 0.2 %
Eosinophils Absolute: 230 cells/uL (ref 15–500)
Eosinophils Relative: 2.8 %
HCT: 40.6 % (ref 35.0–45.0)
Hemoglobin: 13.5 g/dL (ref 11.7–15.5)
Lymphs Abs: 2214 cells/uL (ref 850–3900)
MCH: 29.9 pg (ref 27.0–33.0)
MCHC: 33.3 g/dL (ref 32.0–36.0)
MCV: 90 fL (ref 80.0–100.0)
MPV: 10.4 fL (ref 7.5–12.5)
Monocytes Relative: 7.2 %
Neutro Abs: 5150 cells/uL (ref 1500–7800)
Neutrophils Relative %: 62.8 %
Platelets: 241 10*3/uL (ref 140–400)
RBC: 4.51 10*6/uL (ref 3.80–5.10)
RDW: 13 % (ref 11.0–15.0)
Total Lymphocyte: 27 %
WBC: 8.2 10*3/uL (ref 3.8–10.8)

## 2020-01-20 LAB — COMPREHENSIVE METABOLIC PANEL
AG Ratio: 1.3 (calc) (ref 1.0–2.5)
ALT: 33 U/L — ABNORMAL HIGH (ref 6–29)
AST: 31 U/L (ref 10–35)
Albumin: 4 g/dL (ref 3.6–5.1)
Alkaline phosphatase (APISO): 25 U/L — ABNORMAL LOW (ref 37–153)
BUN: 15 mg/dL (ref 7–25)
CO2: 28 mmol/L (ref 20–32)
Calcium: 8.7 mg/dL (ref 8.6–10.4)
Chloride: 103 mmol/L (ref 98–110)
Creat: 0.83 mg/dL (ref 0.50–1.05)
Globulin: 3.1 g/dL (calc) (ref 1.9–3.7)
Glucose, Bld: 96 mg/dL (ref 65–99)
Potassium: 3.9 mmol/L (ref 3.5–5.3)
Sodium: 138 mmol/L (ref 135–146)
Total Bilirubin: 1 mg/dL (ref 0.2–1.2)
Total Protein: 7.1 g/dL (ref 6.1–8.1)

## 2020-01-20 LAB — LIPASE: Lipase: 50 U/L (ref 7–60)

## 2020-01-20 MED ORDER — ONDANSETRON HCL 4 MG PO TABS: 4.0000 mg | ORAL_TABLET | Freq: Three times a day (TID) | ORAL | 0 refills | Status: DC | PRN

## 2020-01-20 NOTE — Telephone Encounter (Signed)
[phone]  Patient called mad that no one called her back yesterday, said she was passing blood and asked if she was going to have to find another Gi physician

## 2020-01-20 NOTE — Patient Instructions (Signed)
CT scan

## 2020-01-20 NOTE — Telephone Encounter (Signed)
I offered the patient an appt tomorrow, however she declined, stating she's going out of town.  The patient is scheduled to see LL 12/10 at 8:30am

## 2020-01-20 NOTE — Telephone Encounter (Signed)
FYI Spoke with pt. Pt saw her PCP today 01/20/20 for rectal bleeding and a CT scan was ordered for pt. CT hasn't been completed yet. Pt mentioned that she was vomiting over the weekend with diarrhea and saw bright red blood. Pt's PCP also completed labs on pt.

## 2020-01-20 NOTE — Telephone Encounter (Signed)
Noted. Spoke with pt and she is aware of Tobi Bastos Boone's recommendation.

## 2020-01-20 NOTE — Telephone Encounter (Signed)
She was to have a colonoscopy after I saw her in July, but she cancelled this for unknown reasons on 7/26 and was to call back to reschedule. She is due for surveillance.   When I saw her in July, she was having issues with constipation. Sounds like may be having a self-limiting gastroenteritis, unlikely to rule out other etiologist, and Dr. Jeanice Lim saw her today and ordered labs and imaging. She is under great care with PCP.    Recommend ED evaluation if severe abdominal pain, large volume rectal bleeding, weakness, or dehydration. Otherwise, follow bland diet, push fluids to avoid dehydration, monitor for changes, and await CT scan that has been ordered. I do not have any additional recommendations other than was PCP has already recommended.

## 2020-01-20 NOTE — Telephone Encounter (Signed)
I spoke with the patient and she stated she's ok.

## 2020-01-20 NOTE — Progress Notes (Signed)
   Subjective:    Patient ID: Jenna Love, female    DOB: [birth year], 59 y.o.   MRN: [id number]  Patient presents for Nausea (Pt said that since Sunday she has had nausea and vomitting. She said that she also experienced severe stomach pains. She said that yesterday she also noticed blood in her stool. She said that she has never had this happen. Felt clammy but doesnt think she had fever. )   Sunday had graoes and canetloupe, she started vomiting and diarrhea that persisted throughout the night, she then started having pain in lower abdomen which has persisted  Monday She noticed bright red blood when she wiped , she didn't notice  This Am still had diarrhea , more pink color when wiping She felt clammy , but no fever She took zofran that was old and it helped  No URI symptoms  Doesn't feel as bad today Ate chicken and dumplings yesterday and had gaterade today without vomiting She still doesn't feel well  No known sick contacts She did go to ER but wait time was very long, she had not even been triaged She called her GI but never heard back from on call nurse      Review Of Systems:  GEN- denies fatigue, fever, weight loss,weakness, recent illness HEENT- denies eye drainage, change in vision, nasal discharge, CVS- denies chest pain, palpitations RESP- denies SOB, cough, wheeze ABD- + N/V, change in stools,+ abd pain GU- denies dysuria, hematuria, dribbling, incontinence MSK- denies joint pain, muscle aches, injury Neuro- denies headache, dizziness, syncope, seizure activity       Objective:    BP 118/80   Pulse 68   Resp 10   Ht 5\' 3"  (1.6 m)   Wt 265 lb 6.4 oz (120.4 kg)   LMP 05/25/2014   SpO2 99%   BMI 47.01 kg/m  GEN- NAD, alert and oriented x3 HEENT- PERRL, EOMI, non injected sclera, pink conjunctiva, mild dry MM, oropharynx clear Neck- Supple, no LAD  CVS- RRR, no murmur RESP-CTAB ABD-NABS,soft,TTP Lower abdomen, and epigastric, no rebound, no  gaurding, no CVA tenderness  EXT- No edema Pulses- Radial, DP- 2+        Assessment & Plan:      Problem List Items Addressed This Visit    None    Visit Diagnoses    Generalized abdominal pain    -  Primary   Acute abd pain with N/V blood diarrhea, DD colitis, or other infectious , gasteroenteritis, gallladder etilogy pancreatitis. Check STAT labs, Stat CT scan due to ongoing pain Zofran given  Will alert her GI once results seen   Relevant Orders   CBC with Differential/Platelet   Comprehensive metabolic panel   Lipase   CT Abdomen Pelvis W Contrast   Nausea and vomiting, intractability of vomiting not specified, unspecified vomiting type       Relevant Orders   CBC with Differential/Platelet   Comprehensive metabolic panel   Lipase   CT Abdomen Pelvis W Contrast   Bloody diarrhea       Relevant Orders   CBC with Differential/Platelet   Comprehensive metabolic panel   Lipase   CT Abdomen Pelvis W Contrast      Note: This dictation was prepared with Dragon dictation along with smaller phrase technology. Any transcriptional errors that result from this process are unintentional.

## 2020-01-21 ENCOUNTER — Ambulatory Visit (HOSPITAL_COMMUNITY)
Admission: RE | Admit: 2020-01-21 | Discharge: 2020-01-21 | Disposition: A | Discharge status: Home / Self Care | Payer: Managed Care, Other (non HMO) | Source: Ambulatory Visit | Attending: Family Medicine | Admitting: Family Medicine

## 2020-01-21 DIAGNOSIS — R112 Nausea with vomiting, unspecified: Secondary | ICD-10-CM | POA: Diagnosis present

## 2020-01-21 DIAGNOSIS — R197 Diarrhea, unspecified: Secondary | ICD-10-CM | POA: Diagnosis present

## 2020-01-21 DIAGNOSIS — R1084 Generalized abdominal pain: Secondary | ICD-10-CM | POA: Diagnosis not present

## 2020-01-21 MED ORDER — IOHEXOL 300 MG/ML  SOLN
100.0000 mL | Freq: Once | INTRAMUSCULAR | Status: AC | PRN
  Administered 2020-01-21: 100 mL via INTRAVENOUS

## 2020-01-21 MED ORDER — CIPROFLOXACIN HCL 500 MG PO TABS: 500.0000 mg | ORAL_TABLET | Freq: Two times a day (BID) | ORAL | 0 refills | Status: DC

## 2020-01-21 MED ORDER — METRONIDAZOLE 500 MG PO TABS: 500.0000 mg | ORAL_TABLET | Freq: Two times a day (BID) | ORAL | 0 refills | Status: DC

## 2020-01-21 NOTE — Addendum Note (Signed)
Addended by: Milinda Antis F on: 01/21/2020 05:18 PM   Modules accepted: Orders

## 2020-01-22 ENCOUNTER — Ambulatory Visit (HOSPITAL_COMMUNITY): Admission: RE | Admit: 2020-01-22 | Payer: Managed Care, Other (non HMO) | Source: Ambulatory Visit

## 2020-01-26 ENCOUNTER — Other Ambulatory Visit: Payer: Self-pay | Admitting: Family Medicine

## 2020-01-26 NOTE — Telephone Encounter (Signed)
Patient is requesting a refill of the following medications: Requested Prescriptions   Pending Prescriptions Disp Refills   zolpidem (AMBIEN) 10 MG tablet [Pharmacy Med Name: ZOLPIDEM TAB 10MG ] 90 tablet 0    Sig: TAKE ONE TABLET BY MOUTH AT BEDTIME AS NEEDED FOR INSOMNIA.    Date of patient request: 01/26/20 Last office visit: 01/20/20 Date of last refill: 10/21/19 Last refill amount: 90 + 1 Follow up time period per chart: no f/u scheduled

## 2020-01-26 NOTE — Telephone Encounter (Signed)
Please Advise

## 2020-02-23 ENCOUNTER — Ambulatory Visit (INDEPENDENT_AMBULATORY_CARE_PROVIDER_SITE_OTHER): Payer: 59 | Admitting: Family Medicine

## 2020-02-23 ENCOUNTER — Other Ambulatory Visit: Payer: Self-pay

## 2020-02-23 ENCOUNTER — Encounter: Payer: Self-pay | Admitting: Family Medicine

## 2020-02-23 VITALS — BP 126/84 | HR 104 | Temp 98.7°F | Resp 18 | Ht 63.0 in | Wt 271.0 lb

## 2020-02-23 DIAGNOSIS — J209 Acute bronchitis, unspecified: Secondary | ICD-10-CM

## 2020-02-23 DIAGNOSIS — J019 Acute sinusitis, unspecified: Secondary | ICD-10-CM

## 2020-02-23 DIAGNOSIS — H66002 Acute suppurative otitis media without spontaneous rupture of ear drum, left ear: Secondary | ICD-10-CM | POA: Diagnosis not present

## 2020-02-23 MED ORDER — AMOXICILLIN-POT CLAVULANATE 875-125 MG PO TABS: 1.0000 | ORAL_TABLET | Freq: Two times a day (BID) | ORAL | 0 refills | Status: DC

## 2020-02-23 MED ORDER — IBUPROFEN 600 MG PO TABS: 600.0000 mg | ORAL_TABLET | Freq: Three times a day (TID) | ORAL | 0 refills | Status: DC | PRN

## 2020-02-23 MED ORDER — KETOROLAC TROMETHAMINE 60 MG/2ML IM SOLN
60.0000 mg | Freq: Once | INTRAMUSCULAR | Status: AC
  Administered 2020-02-23: 60 mg via INTRAMUSCULAR

## 2020-02-23 MED ORDER — HYDROCOD POLST-CPM POLST ER 10-8 MG/5ML PO SUER: 5.0000 mL | Freq: Two times a day (BID) | ORAL | 0 refills | Status: DC | PRN

## 2020-02-23 NOTE — Patient Instructions (Addendum)
Toradol given for pain Take ibuprofen with food Antibiotics tussionex for cough  Continue allergy med nasal saline F/U 1 month for meds

## 2020-02-23 NOTE — Progress Notes (Signed)
   Subjective:    Patient ID: Jenna Love, female    DOB: 08/27/1960, 59 y.o.   MRN: [id number]  Patient presents for Illness (x1 week- cough, chest congestion nasal drainage, yeelow to green colored mucus, L sided ear pain)   Pt here with progressive URI symptoms for the past 3 weeks Started with allergy symptoms, cough productive of yellow/green mucous, sinus drainage, chills, no fever. THisAM with severe left ear pain  She has had some wheeze   Her partner states she sounds like a whooping couh at [place]  has been up in the reclimer at night to breathe   using vaporizzer, nyqyuil, tea, OTC meds, no improvement  Some sick contacts at work, and with [place]   COVID vavcine and flu shot UTD     Review Of Systems:  GEN- denies fatigue, fever, weight loss,weakness, recent illness HEENT- denies eye drainage, change in vision, +nasal discharge, CVS- denies chest pain, palpitations RESP- denies SOB, +cough, +wheeze ABD- denies N/V, change in stools, abd pain GU- denies dysuria, hematuria, dribbling, incontinence MSK- denies joint pain, muscle aches, injury Neuro- denies headache, dizziness, syncope, seizure activity       Objective:    BP 126/84   Pulse (!) 104   Temp 98.7 F (37.1 C) (Temporal)   Resp 18   Ht 5\' 3"  (1.6 m)   Wt 271 lb (122.9 kg)   LMP 05/25/2014   SpO2 97%   BMI 48.01 kg/m  GEN- NAD, alert and oriented x3 HEENT- PERRL, EOMI, non injected sclera, pink conjunctiva, MMM, oropharynx mild injection, Right TM clear no effusion, Left injected bulging TM, +effusion, canal clear, pain with manipulation of pinna  + maxillary sinus tenderness, inflammed turbinates,  Nasal drainage  Neck- Supple, + shotty ant  LAD CVS- RRR, no murmur RESP-CTAB EXT- No edema Pulses- Radial 2+         Assessment & Plan:      Problem List Items Addressed This Visit    None    Visit Diagnoses    Acute rhinosinusitis    -  Primary   Pt with Sinusitis, and Left  OM, bronchial symptoms as swell, given tussionex, augmentin, with sick contacts, covid/respiratory panel done, fluids, NSAIDS    Relevant Medications   amoxicillin-clavulanate (AUGMENTIN) 875-125 MG tablet   chlorpheniramine-HYDROcodone (TUSSIONEX PENNKINETIC ER) 10-8 MG/5ML SUER   Other Relevant Orders   SARS-CoV-2 RNA (COVID-19) and Respiratory Viral Panel, Qualitative NAAT   Non-recurrent acute suppurative otitis media of left ear without spontaneous rupture of tympanic membrane       Given toradal in office due to severe pain    Relevant Medications   amoxicillin-clavulanate (AUGMENTIN) 875-125 MG tablet   ketorolac (TORADOL) injection 60 mg (Completed)   Acute bronchitis, unspecified organism       Relevant Orders   SARS-CoV-2 RNA (COVID-19) and Respiratory Viral Panel, Qualitative NAAT      Note: This dictation was prepared with Dragon dictation along with smaller phrase technology. Any transcriptional errors that result from this process are unintentional.

## 2020-02-24 ENCOUNTER — Encounter: Payer: Self-pay | Admitting: Family Medicine

## 2020-02-26 LAB — SARS-COV-2 RNA (COVID-19) RESP VIRAL PNL QL NAAT
Adenovirus B: NOT DETECTED
HUMAN PARAINFLU VIRUS 1: NOT DETECTED
HUMAN PARAINFLU VIRUS 2: NOT DETECTED
HUMAN PARAINFLU VIRUS 3: NOT DETECTED
INFLUENZA A SUBTYPE H1: NOT DETECTED
INFLUENZA A SUBTYPE H3: NOT DETECTED
Influenza A: NOT DETECTED
Influenza B: NOT DETECTED
Metapneumovirus: NOT DETECTED
Respiratory Syncytial Virus A: NOT DETECTED
Respiratory Syncytial Virus B: NOT DETECTED
Rhinovirus: DETECTED — AB
SARS CoV2 RNA: NOT DETECTED

## 2020-02-27 ENCOUNTER — Encounter: Payer: Self-pay | Admitting: Family Medicine

## 2020-02-27 ENCOUNTER — Ambulatory Visit: Payer: Managed Care, Other (non HMO) | Admitting: Gastroenterology

## 2020-02-27 MED ORDER — ALBUTEROL SULFATE HFA 108 (90 BASE) MCG/ACT IN AERS: 2.0000 | INHALATION_SPRAY | Freq: Four times a day (QID) | RESPIRATORY_TRACT | 2 refills | Status: DC | PRN

## 2020-03-01 ENCOUNTER — Encounter: Payer: Self-pay | Admitting: Family Medicine

## 2020-03-01 ENCOUNTER — Other Ambulatory Visit: Payer: Self-pay

## 2020-03-01 ENCOUNTER — Ambulatory Visit (INDEPENDENT_AMBULATORY_CARE_PROVIDER_SITE_OTHER): Payer: 59 | Admitting: Family Medicine

## 2020-03-01 VITALS — BP 130/82 | HR 100 | Temp 98.0°F | Resp 16 | Ht 63.0 in | Wt 268.0 lb

## 2020-03-01 DIAGNOSIS — J019 Acute sinusitis, unspecified: Secondary | ICD-10-CM

## 2020-03-01 DIAGNOSIS — J209 Acute bronchitis, unspecified: Secondary | ICD-10-CM

## 2020-03-01 DIAGNOSIS — H6092 Unspecified otitis externa, left ear: Secondary | ICD-10-CM

## 2020-03-01 MED ORDER — AMOXICILLIN-POT CLAVULANATE 875-125 MG PO TABS: 1.0000 | ORAL_TABLET | Freq: Two times a day (BID) | ORAL | 0 refills | Status: DC

## 2020-03-01 MED ORDER — PREDNISONE 20 MG PO TABS: 40.0000 mg | ORAL_TABLET | Freq: Every day | ORAL | 0 refills | Status: DC

## 2020-03-01 MED ORDER — CIPROFLOXACIN HCL 0.2 % OT SOLN: 0.2000 mL | Freq: Two times a day (BID) | OTIC | 0 refills | Status: DC

## 2020-03-01 MED ORDER — OFLOXACIN 0.3 % OT SOLN: 10.0000 [drp] | Freq: Two times a day (BID) | OTIC | 0 refills | Status: AC

## 2020-03-01 NOTE — Telephone Encounter (Signed)
URGENT ENT appt referral sent

## 2020-03-01 NOTE — Progress Notes (Signed)
   Subjective:    Patient ID: Jenna Love, female    DOB: 1960-06-04, 59 y.o.   MRN: [id number]  Patient presents for Illness (Cough, nasal congestion, L ear pain)  Patient was seen 1 week ago with URI symptoms along with severe ear pain.  She was diagnosed with left otitis media and bronchitis.  She has a history of bronchitis.  She was prescribed Augmentin along with Tussionex and albuterol inhaler.  Positive sick contact therefore a respiratory panel done which came back positive for rhinovirus  She is here today due to persistent left ear pain.  He had drainage from the ear over the weekend and she is right side which helps.  It is no longer draining but her ear is swollen and she cannot hear out of it.  Is not had any fever or chills.  Her sinus pressure and drainage has improved as well as her cough but she is still getting some wheezing episodes.  She is still taking her Augmentin as prescribed.   Review Of Systems:  GEN- denies fatigue, fever, weight loss,weakness, recent illness HEENT- denies eye drainage, change in vision,+ nasal discharge, CVS- denies chest pain, palpitations RESP- denies SOB, +cough, +wheeze ABD- denies N/V, change in stools, abd pain GU- denies dysuria, hematuria, dribbling, incontinence MSK- denies joint pain, muscle aches, injury Neuro- denies headache, dizziness, syncope, seizure activity       Objective:    BP 130/82   Pulse 100   Temp 98 F (36.7 C) (Temporal)   Resp 16   Ht 5\' 3"  (1.6 m)   Wt 268 lb (121.6 kg)   LMP 05/25/2014   SpO2 97%   BMI 47.47 kg/m  GEN- NAD, alert and oriented x3 HEENT- PERRL, EOMI, non injected sclera, pink conjunctiva, MMM, clear , Right TM clear no effusion,swelling of left pinna/ear, very small opening to ear drum, only able to visualize about a 1/4 of drum, suspect ruptured, swelling of canal  pain with manipulation of pinna  + maxillary sinus tenderness, inflammed turbinates,  Nasal drainage  Neck-  Supple, + shotty ant  LAD CVS- RRR, no murmur RESP-CTAB EXT- No edema Pulses- Radial 2+  Assessment & Plan:      Problem List Items Addressed This Visit   None   Visit Diagnoses    Acute rhinosinusitis    -  Primary   Relevant Medications   amoxicillin-clavulanate (AUGMENTIN) 875-125 MG tablet   predniSONE (DELTASONE) 20 MG tablet   Otitis externa of left ear, unspecified chronicity, unspecified type       Initially with OM now with rupture leating to external infection, she has small opening, discussed ENT and placing wick, she wants to try drops at home first. If unable to get drops in canal will need urgent ENT visit  will extend antibiotics another 4 days, total of 2 weeks Will add cipro otic drops  prednisone may help with swelling  Warm compress    Acute bronchitis, unspecified organism       continue tussionex, add prednisone, continue albuterol       Note: This dictation was prepared with Dragon dictation along with smaller phrase technology. Any transcriptional errors that result from this process are unintentional.

## 2020-03-01 NOTE — Patient Instructions (Signed)
F/U as needed

## 2020-03-01 NOTE — Addendum Note (Signed)
Addended by: Milinda Antis F on: 03/01/2020 12:59 PM   Modules accepted: Orders

## 2020-03-14 ENCOUNTER — Encounter: Payer: Self-pay | Admitting: Family Medicine

## 2020-03-15 MED ORDER — IBUPROFEN 600 MG PO TABS
600.0000 mg | ORAL_TABLET | Freq: Three times a day (TID) | ORAL | 0 refills | Status: DC | PRN
Start: 2020-03-15 — End: 2021-03-15

## 2020-03-15 NOTE — Telephone Encounter (Signed)
Ok to refill 

## 2020-03-16 ENCOUNTER — Encounter: Payer: Self-pay | Admitting: Family Medicine

## 2020-03-26 ENCOUNTER — Ambulatory Visit: Payer: Managed Care, Other (non HMO) | Admitting: Gastroenterology

## 2020-03-29 ENCOUNTER — Encounter: Payer: Self-pay | Admitting: Family Medicine

## 2020-03-30 MED ORDER — ZOLPIDEM TARTRATE 10 MG PO TABS: 10.0000 mg | ORAL_TABLET | Freq: Every evening | ORAL | 0 refills | Status: DC | PRN

## 2020-03-30 NOTE — Telephone Encounter (Signed)
Ok to refill??  Last office visit 03/01/2020.  Last refill 01/26/2020.

## 2020-05-12 ENCOUNTER — Encounter: Payer: Self-pay | Admitting: Family Medicine

## 2020-05-13 ENCOUNTER — Other Ambulatory Visit: Payer: Self-pay

## 2020-05-13 MED ORDER — FUROSEMIDE 20 MG PO TABS: ORAL_TABLET | ORAL | 1 refills | Status: DC

## 2020-05-13 MED ORDER — SERTRALINE HCL 100 MG PO TABS: 150.0000 mg | ORAL_TABLET | Freq: Every day | ORAL | 1 refills | Status: AC

## 2020-05-24 ENCOUNTER — Encounter: Payer: Self-pay | Admitting: Internal Medicine

## 2020-05-24 ENCOUNTER — Ambulatory Visit: Payer: Managed Care, Other (non HMO) | Admitting: Internal Medicine

## 2020-05-24 ENCOUNTER — Other Ambulatory Visit: Payer: Self-pay

## 2020-05-24 VITALS — BP 115/66 | HR 89 | Temp 98.2°F | Resp 18 | Ht 63.0 in | Wt 269.4 lb

## 2020-05-24 DIAGNOSIS — F5104 Psychophysiologic insomnia: Secondary | ICD-10-CM

## 2020-05-24 DIAGNOSIS — F411 Generalized anxiety disorder: Secondary | ICD-10-CM | POA: Diagnosis not present

## 2020-05-24 DIAGNOSIS — Z7689 Persons encountering health services in other specified circumstances: Secondary | ICD-10-CM | POA: Diagnosis not present

## 2020-05-24 DIAGNOSIS — E669 Obesity, unspecified: Secondary | ICD-10-CM

## 2020-05-24 DIAGNOSIS — Z1231 Encounter for screening mammogram for malignant neoplasm of breast: Secondary | ICD-10-CM

## 2020-05-24 DIAGNOSIS — H66003 Acute suppurative otitis media without spontaneous rupture of ear drum, bilateral: Secondary | ICD-10-CM

## 2020-05-24 DIAGNOSIS — R609 Edema, unspecified: Secondary | ICD-10-CM

## 2020-05-24 DIAGNOSIS — F331 Major depressive disorder, recurrent, moderate: Secondary | ICD-10-CM | POA: Diagnosis not present

## 2020-05-24 DIAGNOSIS — R5382 Chronic fatigue, unspecified: Secondary | ICD-10-CM

## 2020-05-24 MED ORDER — CIPROFLOXACIN-DEXAMETHASONE 0.3-0.1 % OT SUSP: 4.0000 [drp] | Freq: Two times a day (BID) | OTIC | 0 refills | Status: DC

## 2020-05-24 NOTE — Patient Instructions (Signed)
Please continue taking medications as prescribed.  Please follow DASH diet and perform moderate exercise/walking at least 150 mins/week.  You are being referred to Psychiatry.

## 2020-05-24 NOTE — Progress Notes (Signed)
New Patient Office Visit  Subjective:  Patient ID: Jenna Love, female    DOB: 01/03/1961  Age: 60 y.o. MRN: [id number]  CC:  Chief Complaint  Patient presents with  . New Patient (Initial Visit)    New patient always feels tired and both legs hurt all the time also had ear infection back in jan but they still feel stopped up and popping     HPI Jenna Love is a 60 year old female with PMH of GAD, depression, anxiety and morbid obesity who presents for establishing care. She is a former patient of Dr Buelah Manis.  She c/o chronic insomnia, uncontrolled with Clonazepam and Ambien. She has also h/o GAD. She takes 2 tablets of Clonazepam and Ambien 10 mg, and continues to have anxiety. She has been stressed lately as her husband had a stroke about 3 months ago, and she has been taking care of him. Chart review suggests history of domestic stressors. She denies SI or HI. She also c/o chronic fatigue. She denies any recent change in appetite or weight.  She c/o b/l ear pain and fullness. She was treated with Ciprofloxacin ear drops for otitis media in 03/2020. She denies hearing problem, ear discharge or tinnitus. Denies fever or chills.  She has h/o chronic LE edema, for which she takes Lasix PRN. Denies dyspnea, orthopnea or PND.  She has had 2 doses of COVID vaccine.  Past Medical History:  Diagnosis Date  . Anxiety   . Chronic cough 05/20/2013  . Panic attacks   . Pneumonia   . Thyroid nodule 2013   Benign biopsy- ENT Dr. Constance Holster    Past Surgical History:  Procedure Laterality Date  . CESAREAN SECTION  1992  . COLONOSCOPY WITH PROPOFOL N/A 11/05/2014   One 5 mm sessile polyp in base of cecum. otherwise normal. fecal material on path. Surveillance in 5 years.   Marland Kitchen HYSTEROSCOPY WITH D & C N/A 10/03/2013   Procedure: DILATATION AND CURETTAGE /HYSTEROSCOPY;  Surgeon: [name], MD;  Location: [facility];  Service: Gynecology;  Laterality: N/A;  . HYSTEROSCOPY WITH D &  C N/A 11/05/2019   Procedure: DILATATION AND CURETTAGE HYSTEROSCOPY;  Surgeon: [name], MD;  Location: [facility];  Service: Gynecology;  Laterality: N/A;  . POLYPECTOMY N/A 10/03/2013   Procedure: REMOVAL OF ENDOMETRIAL POLYP;  Surgeon: [name], MD;  Location: [facility];  Service: Gynecology;  Laterality: N/A;  . POLYPECTOMY N/A 11/05/2014   Procedure: POLYPECTOMY;  Surgeon: Daneil Dolin, MD;  Location: [facility];  Service: Endoscopy;  Laterality: N/A;  . uterus polyp    . VULVA /PERINEUM BIOPSY Right 11/05/2019   Procedure: VULVAR BIOPSY;  Surgeon: [name], MD;  Location: [facility];  Service: Gynecology;  Laterality: Right;    Family History  Problem Relation Age of Onset  . Diabetes Mother   . Alzheimer's disease Mother   . Cancer Father        lung   . Cancer Paternal Grandfather   . Hodgkin's lymphoma Son   . Colon cancer Neg Hx   . Colon polyps Neg Hx     Social History   Socioeconomic History  . Marital status: Married    Spouse name: Not on file  . Number of children: 2  . Years of education: Not on file  . Highest education level: Not on file  Occupational History  . Not on file  Tobacco Use  . Smoking status: Never Smoker  . Smokeless tobacco:  Never Used  Vaping Use  . Vaping Use: Never used  Substance and Sexual Activity  . Alcohol use: No  . Drug use: No  . Sexual activity: Not Currently    Birth control/protection: Post-menopausal  Other Topics Concern  . Not on file  Social History Narrative  . Not on file   Social Determinants of Health   Financial Resource Strain: Not on file  Food Insecurity: Not on file  Transportation Needs: Not on file  Physical Activity: Not on file  Stress: Not on file  Social Connections: Not on file  Intimate Partner Violence: Not on file    ROS Review of Systems  Constitutional: Positive for fatigue. Negative for chills and fever.  HENT: Positive for ear pain. Negative for congestion, sinus pressure,  sinus pain and sore throat.   Eyes: Negative for pain and discharge.  Respiratory: Negative for cough and shortness of breath.   Cardiovascular: Negative for chest pain and palpitations.  Gastrointestinal: Negative for abdominal pain, constipation, diarrhea, nausea and vomiting.  Endocrine: Negative for polydipsia and polyuria.  Genitourinary: Negative for dysuria and hematuria.  Musculoskeletal: Negative for neck pain and neck stiffness.  Skin: Negative for rash.  Neurological: Negative for dizziness and weakness.  Psychiatric/Behavioral: Positive for dysphoric mood and sleep disturbance. Negative for agitation and behavioral problems. The patient is nervous/anxious.     Objective:   Today's Vitals: BP 115/66 (BP Location: Left Arm, Patient Position: Sitting, Cuff Size: Normal)   Pulse 89   Temp 98.2 F (36.8 C) (Oral)   Resp 18   Ht _0  (1.6 m)   Wt 269 lb 6.4 oz (122.2 kg)   LMP 05/25/2014   SpO2 97%   BMI 47.72 kg/m   Physical Exam Vitals reviewed.  Constitutional:      General: She is not in acute distress.    Appearance: She is obese. She is not diaphoretic.  HENT:     Head: Normocephalic and atraumatic.     Right Ear: Hearing and external ear normal. Tympanic membrane is erythematous.     Left Ear: Hearing and external ear normal. A middle ear effusion is present. Tympanic membrane is erythematous.     Nose: Nose normal.     Mouth/Throat:     Mouth: Mucous membranes are moist.  Eyes:     General: No scleral icterus.    Extraocular Movements: Extraocular movements intact.     Pupils: Pupils are equal, round, and reactive to light.  Cardiovascular:     Rate and Rhythm: Normal rate and regular rhythm.     Pulses: Normal pulses.     Heart sounds: Normal heart sounds. No murmur heard.   Pulmonary:     Breath sounds: Normal breath sounds. No wheezing or rales.  Abdominal:     Palpations: Abdomen is soft.     Tenderness: There is no abdominal tenderness.   Musculoskeletal:     Cervical back: Neck supple. No tenderness.     Right lower leg: No edema.     Left lower leg: No edema.  Skin:    General: Skin is warm.     Findings: No rash.  Neurological:     General: No focal deficit present.     Mental Status: She is alert and oriented to person, place, and time.  Psychiatric:        Mood and Affect: Mood normal.        Behavior: Behavior normal.     Assessment & Plan:  Problem List Items Addressed This Visit      Encounter to establish care - Primary   Care established Previous chart reviewed History and medications reviewed with the patient     Relevant Orders  CBC  CMP14+EGFR  Lipid panel  TSH + free T4  Vitamin D (25 hydroxy)  Hemoglobin A1c    Nervous and Auditory   Non-recurrent acute suppurative otitis media of both ears without spontaneous rupture of tympanic membranes    C/o b/l ear pain Erythema in ear canal, with whitish discharge noted in left ear canal Started Ciprodex ear drops Debrox for excess ear wax      Relevant Medications   ciprofloxacin-dexamethasone (CIPRODEX) OTIC suspension     Other   GAD (generalized anxiety disorder)    On Clonazepam 1 mg TID PRN Takes Ambien 10 mg qHS for insomnia Has persistent anxiety symptoms especially at nighttime, has domestic stressors Referred to Psychiatry      Relevant Orders   Ambulatory referral to Psychiatry   Insomnia    Takes Ambien 10 mg qHS, but still has persistent symptoms.      Class 3 obesity    Diet modification and moderate exercise as tolerated Has tried Trulicity in the past, did not tolerate it well. Can be a candidate for Bariatric surgery - will discuss it later.      Relevant Orders   Hemoglobin A1c   MDD (major depressive disorder) (HCC)    PHQ-9: 12 On Zoloft 150 mg QD Referred to Psychiatry      Peripheral edema    Takes Lasix PRN Advised to keep legs elevated and use compression stockings         Chronic fatigue     Unclear etiology Could be a sign of depression, but would check CBC, BMP and TSH first If persistent, will check sleep study Advised for proper hydration      Relevant Orders   CMP14+EGFR   TSH + free T4   Vitamin D (25 hydroxy)    Other Visit Diagnoses    Screening mammogram for breast cancer       Relevant Orders   MM 3D SCREEN BREAST BILATERAL      Outpatient Encounter Medications as of 05/24/2020  Medication Sig  . Cholecalciferol (VITAMIN D) 2000 units CAPS Take by mouth.  . ciprofloxacin-dexamethasone (CIPRODEX) OTIC suspension Place 4 drops into both ears 2 (two) times daily.  . clonazePAM (KLONOPIN) 1 MG tablet TAKE 1 TABLET BY MOUTH 3 TIMES A DAY AS NEEDED FOR ANXIETY.  . furosemide (LASIX) 20 MG tablet TAKE (1) TABLET BY MOUTH ONCE daily prn  . ibuprofen (ADVIL) 600 MG tablet Take 1 tablet (600 mg total) by mouth every 8 (eight) hours as needed.  . ondansetron (ZOFRAN) 4 MG tablet Take 1 tablet (4 mg total) by mouth every 8 (eight) hours as needed for nausea or vomiting.  . sertraline (ZOLOFT) 100 MG tablet Take 1.5 tablets (150 mg total) by mouth daily.  . vitamin E 400 UNIT capsule Take 400 Units by mouth daily.  Marland Kitchen zolpidem (AMBIEN) 10 MG tablet Take 1 tablet (10 mg total) by mouth at bedtime as needed. for insomnia  . [DISCONTINUED] Ciprofloxacin HCl 0.2 % otic solution Place 0.2 mLs into the left ear 2 (two) times daily. For 1 week  . [DISCONTINUED] albuterol (VENTOLIN HFA) 108 (90 Base) MCG/ACT inhaler Inhale 2 puffs into the lungs every 6 (six) hours as needed for wheezing or shortness of breath. (Patient not  taking: Reported on 05/24/2020)  . [DISCONTINUED] amoxicillin-clavulanate (AUGMENTIN) 875-125 MG tablet Take 1 tablet by mouth 2 (two) times daily. (Patient not taking: Reported on 05/24/2020)  . [DISCONTINUED] amoxicillin-clavulanate (AUGMENTIN) 875-125 MG tablet Take 1 tablet by mouth 2 (two) times daily. (Patient not taking: Reported on 05/24/2020)  .  [DISCONTINUED] chlorpheniramine-HYDROcodone (TUSSIONEX PENNKINETIC ER) 10-8 MG/5ML SUER Take 5 mLs by mouth every 12 (twelve) hours as needed. (Patient not taking: Reported on 05/24/2020)  . [DISCONTINUED] Clobetasol Prop Emollient Base 0.05 % emollient cream Apply bid to affected area for 2 weeks then once daily till appointment (Patient not taking: Reported on 05/24/2020)  . [DISCONTINUED] Dulaglutide (TRULICITY) 1.58 YO/4.5TX SOPN Inject 1.5 mg into the skin once a week. (Patient not taking: Reported on 05/24/2020)  . [DISCONTINUED] predniSONE (DELTASONE) 20 MG tablet Take 2 tablets (40 mg total) by mouth daily with breakfast. (Patient not taking: Reported on 05/24/2020)  . [DISCONTINUED] valACYclovir (VALTREX) 1000 MG tablet Take 1 tablet (1,000 mg total) by mouth daily. (Patient not taking: Reported on 05/24/2020)   No facility-administered encounter medications on file as of 05/24/2020.    Follow-up: Return in about 4 months (around 09/23/2020).   Lindell Spar, MD

## 2020-05-24 NOTE — Assessment & Plan Note (Signed)
Takes Ambien 10 mg qHS, but still has persistent symptoms.

## 2020-05-24 NOTE — Assessment & Plan Note (Signed)
PHQ-9: 12 On Zoloft 150 mg QD Referred to Psychiatry

## 2020-05-24 NOTE — Assessment & Plan Note (Signed)
Care established Previous chart reviewed History and medications reviewed with the patient 

## 2020-05-24 NOTE — Assessment & Plan Note (Signed)
Takes Lasix PRN Advised to keep legs elevated and use compression stockings

## 2020-05-24 NOTE — Assessment & Plan Note (Signed)
Diet modification and moderate exercise as tolerated Has tried Trulicity in the past, did not tolerate it well. Can be a candidate for Bariatric surgery - will discuss it later.

## 2020-05-24 NOTE — Assessment & Plan Note (Signed)
C/o b/l ear pain Erythema in ear canal, with whitish discharge noted in left ear canal Started Ciprodex ear drops Debrox for excess ear wax

## 2020-05-24 NOTE — Assessment & Plan Note (Signed)
On Clonazepam 1 mg TID PRN Takes Ambien 10 mg qHS for insomnia Has persistent anxiety symptoms especially at nighttime, has domestic stressors Referred to Psychiatry

## 2020-05-24 NOTE — Assessment & Plan Note (Addendum)
Unclear etiology Could be a sign of depression, but would check CBC, BMP and TSH first If persistent, will check sleep study Advised for proper hydration

## 2020-05-25 LAB — LIPID PANEL
Chol/HDL Ratio: 2.9 ratio (ref 0.0–4.4)
Cholesterol, Total: 173 mg/dL (ref 100–199)
HDL: 59 mg/dL (ref >39)
LDL Chol Calc (NIH): 96 mg/dL (ref 0–99)
Triglycerides: 100 mg/dL (ref 0–149)
VLDL Cholesterol Cal: 18 mg/dL (ref 5–40)

## 2020-05-25 LAB — CBC
Hematocrit: 41.6 % (ref 34.0–46.6)
Hemoglobin: 14.1 g/dL (ref 11.1–15.9)
MCH: 30 pg (ref 26.6–33.0)
MCHC: 33.9 g/dL (ref 31.5–35.7)
MCV: 89 fL (ref 79–97)
Platelets: 236 10*3/uL (ref 150–450)
RBC: 4.7 x10E6/uL (ref 3.77–5.28)
RDW: 13 % (ref 11.7–15.4)
WBC: 4.4 10*3/uL (ref 3.4–10.8)

## 2020-05-25 LAB — CMP14+EGFR
ALT: 31 IU/L (ref 0–32)
AST: 37 IU/L (ref 0–40)
Albumin/Globulin Ratio: 1.1 — ABNORMAL LOW (ref 1.2–2.2)
Albumin: 4.3 g/dL (ref 3.8–4.9)
Alkaline Phosphatase: 33 IU/L — ABNORMAL LOW (ref 44–121)
BUN/Creatinine Ratio: 21 (ref 9–23)
BUN: 17 mg/dL (ref 6–24)
Bilirubin Total: 0.8 mg/dL (ref 0.0–1.2)
CO2: 18 mmol/L — ABNORMAL LOW (ref 20–29)
Calcium: 9.4 mg/dL (ref 8.7–10.2)
Chloride: 103 mmol/L (ref 96–106)
Creatinine, Ser: 0.82 mg/dL (ref 0.57–1.00)
Globulin, Total: 3.9 g/dL (ref 1.5–4.5)
Glucose: 119 mg/dL — ABNORMAL HIGH (ref 65–99)
Potassium: 4.6 mmol/L (ref 3.5–5.2)
Sodium: 140 mmol/L (ref 134–144)
Total Protein: 8.2 g/dL (ref 6.0–8.5)
eGFR: 83 mL/min/{1.73_m2} (ref >59)

## 2020-05-25 LAB — TSH+FREE T4
Free T4: 1.19 ng/dL (ref 0.82–1.77)
TSH: 2.09 u[IU]/mL (ref 0.450–4.500)

## 2020-05-25 LAB — HEMOGLOBIN A1C
Est. average glucose Bld gHb Est-mCnc: 114 mg/dL
Hgb A1c MFr Bld: 5.6 % (ref 4.8–5.6)

## 2020-05-25 LAB — VITAMIN D 25 HYDROXY (VIT D DEFICIENCY, FRACTURES): Vit D, 25-Hydroxy: 26.7 ng/mL — ABNORMAL LOW (ref 30.0–100.0)

## 2020-05-26 ENCOUNTER — Encounter: Payer: Self-pay | Admitting: Internal Medicine

## 2020-05-27 ENCOUNTER — Encounter: Payer: Self-pay | Admitting: Internal Medicine

## 2020-05-27 ENCOUNTER — Telehealth (INDEPENDENT_AMBULATORY_CARE_PROVIDER_SITE_OTHER): Payer: Managed Care, Other (non HMO) | Admitting: Internal Medicine

## 2020-05-27 ENCOUNTER — Other Ambulatory Visit: Payer: Self-pay

## 2020-05-27 DIAGNOSIS — K2970 Gastritis, unspecified, without bleeding: Secondary | ICD-10-CM

## 2020-05-27 DIAGNOSIS — K529 Noninfective gastroenteritis and colitis, unspecified: Secondary | ICD-10-CM

## 2020-05-27 MED ORDER — ONDANSETRON HCL 4 MG PO TABS: 4.0000 mg | ORAL_TABLET | Freq: Three times a day (TID) | ORAL | 0 refills | Status: DC | PRN

## 2020-05-27 MED ORDER — FAMOTIDINE 20 MG PO TABS: 20.0000 mg | ORAL_TABLET | Freq: Every day | ORAL | 2 refills | Status: DC

## 2020-05-27 NOTE — Progress Notes (Signed)
Virtual Visit via Telephone Note   This visit type was conducted due to national recommendations for restrictions regarding the COVID-19 Pandemic (e.g. social distancing) in an effort to limit this patient's exposure and mitigate transmission in our community.  Due to her co-morbid illnesses, this patient is at least at moderate risk for complications without adequate follow up.  This format is felt to be most appropriate for this patient at this time.  The patient did not have access to video technology/had technical difficulties with video requiring transitioning to audio format only (telephone).  All issues noted in this document were discussed and addressed.  No physical exam could be performed with this format.  Evaluation Performed:  Follow-up visit  Date:  05/27/2020   ID:  [name], Lottman 08/02/1960, MRN [id number]  Patient Location: Home Provider Location: Office/Clinic  Participants: Patient Location of Patient: Home Location of Provider: Telehealth Consent was obtain for visit to be over via telehealth. I verified that I am speaking with the correct person using two identifiers.  PCP:  Anabel Halon, MD   Chief Complaint:  Nausea and vomiting  History of Present Illness:    Jenna Love is a 60 y.o. female who has a televisit for c/o nausea and vomiting for last 2 days.  She states that she had 3 episodes of nonbilious, nonbloody vomiting yesterday.  She took Zofran and felt better after it.  She denies any recent outside food that could have contributed to this.  She had few episodes of loose BM yesterday, but has been feeling bloated today.  She denies any melena, hematochezia, fever or chills.  She also mentions that she has been having leg pain since yesterday.  The patient does not have symptoms concerning for COVID-19 infection (fever, chills, cough, or new shortness of breath).   Past Medical, Surgical, Social History, Allergies, and  Medications have been Reviewed.  Past Medical History:  Diagnosis Date   Anxiety    Chronic cough 05/20/2013   Panic attacks    Pneumonia    Thyroid nodule 2013   Benign biopsy- ENT Dr. Pollyann Kennedy   Past Surgical History:  Procedure Laterality Date   CESAREAN SECTION  1992   COLONOSCOPY WITH PROPOFOL N/A 11/05/2014   One 5 mm sessile polyp in base of cecum. otherwise normal. fecal material on path. Surveillance in 5 years.    HYSTEROSCOPY WITH D & C N/A 10/03/2013   Procedure: DILATATION AND CURETTAGE /HYSTEROSCOPY;  Surgeon: Tilda Burrow, MD;  Location: [facility];  Service: Gynecology;  Laterality: N/A;   HYSTEROSCOPY WITH D & C N/A 11/05/2019   Procedure: DILATATION AND CURETTAGE HYSTEROSCOPY;  Surgeon: Tilda Burrow, MD;  Location: [facility];  Service: Gynecology;  Laterality: N/A;   POLYPECTOMY N/A 10/03/2013   Procedure: REMOVAL OF ENDOMETRIAL POLYP;  Surgeon: Tilda Burrow, MD;  Location: [facility];  Service: Gynecology;  Laterality: N/A;   POLYPECTOMY N/A 11/05/2014   Procedure: POLYPECTOMY;  Surgeon: Corbin Ade, MD;  Location: [facility];  Service: Endoscopy;  Laterality: N/A;   uterus polyp     VULVA /PERINEUM BIOPSY Right 11/05/2019   Procedure: VULVAR BIOPSY;  Surgeon: Tilda Burrow, MD;  Location: [facility];  Service: Gynecology;  Laterality: Right;     Current Meds  Medication Sig   Cholecalciferol (VITAMIN D) 2000 units CAPS Take by mouth.   ciprofloxacin-dexamethasone (CIPRODEX) OTIC suspension Place 4 drops into both ears 2 (two) times daily.   clonazePAM (KLONOPIN)  1 MG tablet TAKE 1 TABLET BY MOUTH 3 TIMES A DAY AS NEEDED FOR ANXIETY.   furosemide (LASIX) 20 MG tablet TAKE (1) TABLET BY MOUTH ONCE daily prn   ibuprofen (ADVIL) 600 MG tablet Take 1 tablet (600 mg total) by mouth every 8 (eight) hours as needed.   ondansetron (ZOFRAN) 4 MG tablet Take 1 tablet (4 mg total) by mouth every 8 (eight) hours as needed for nausea or vomiting.   sertraline (ZOLOFT)  100 MG tablet Take 1.5 tablets (150 mg total) by mouth daily.   vitamin E 400 UNIT capsule Take 400 Units by mouth daily.   zolpidem (AMBIEN) 10 MG tablet Take 1 tablet (10 mg total) by mouth at bedtime as needed. for insomnia     Allergies:   Patient has no known allergies.   ROS:   Please see the history of present illness.     All other systems reviewed and are negative.   Labs/Other Tests and Data Reviewed:    Recent Labs: 05/24/2020: ALT 31; BUN 17; Creatinine, Ser 0.82; Hemoglobin 14.1; Platelets 236; Potassium 4.6; Sodium 140; TSH 2.090   Recent Lipid Panel Lab Results  Component Value Date/Time   CHOL 173 05/24/2020 09:17 AM   TRIG 100 05/24/2020 09:17 AM   HDL 59 05/24/2020 09:17 AM   CHOLHDL 2.9 05/24/2020 09:17 AM   CHOLHDL 2.9 10/15/2019 08:33 AM   LDLCALC 96 05/24/2020 09:17 AM   LDLCALC 86 10/15/2019 08:33 AM    Wt Readings from Last 3 Encounters:  05/24/20 269 lb 6.4 oz (122.2 kg)  03/01/20 268 lb (121.6 kg)  02/23/20 271 lb (122.9 kg)      ASSESSMENT & PLAN:    Gastroenteritis Most of the gastroenteritis are self resolving -advised to maintain proper hydration Zofran as needed for nausea/vomiting Added Pepcid for gastritis If persistent symptoms, will prescribe Augmentin considering history of diverticulitis  Time:   Today, I have spent 7 minutes reviewing the chart, including problem list, medications, and with the patient with telehealth technology discussing the above problems.   Medication Adjustments/Labs and Tests Ordered: Current medicines are reviewed at length with the patient today.  Concerns regarding medicines are outlined above.   Tests Ordered: No orders of the defined types were placed in this encounter.   Medication Changes: No orders of the defined types were placed in this encounter.    Note: This dictation was prepared with Dragon dictation along with smaller phrase technology. Similar sounding words can be transcribed  inadequately or may not be corrected upon review. Any transcriptional errors that result from this process are unintentional.      Disposition:  Follow up  Signed, Anabel Halon, MD  05/27/2020 11:58 AM     [name]

## 2020-05-27 NOTE — Patient Instructions (Signed)

## 2020-06-14 ENCOUNTER — Ambulatory Visit (HOSPITAL_COMMUNITY)
Admission: RE | Admit: 2020-06-14 | Discharge: 2020-06-14 | Disposition: A | Discharge status: Home / Self Care | Payer: Managed Care, Other (non HMO) | Source: Ambulatory Visit | Attending: Internal Medicine | Admitting: Internal Medicine

## 2020-06-14 DIAGNOSIS — Z1231 Encounter for screening mammogram for malignant neoplasm of breast: Secondary | ICD-10-CM | POA: Diagnosis present

## 2020-06-25 ENCOUNTER — Encounter: Payer: Self-pay | Admitting: Internal Medicine

## 2020-06-25 ENCOUNTER — Other Ambulatory Visit: Payer: Self-pay | Admitting: Internal Medicine

## 2020-06-25 ENCOUNTER — Other Ambulatory Visit: Payer: Self-pay | Admitting: Family Medicine

## 2020-06-25 DIAGNOSIS — F411 Generalized anxiety disorder: Secondary | ICD-10-CM

## 2020-06-25 MED ORDER — CLONAZEPAM 1 MG PO TABS
ORAL_TABLET | ORAL | 1 refills | Status: DC
Start: 2020-06-25 — End: 2020-08-26

## 2020-07-15 ENCOUNTER — Encounter: Payer: Self-pay | Admitting: Internal Medicine

## 2020-07-15 ENCOUNTER — Other Ambulatory Visit: Payer: Self-pay | Admitting: Internal Medicine

## 2020-07-15 DIAGNOSIS — F5104 Psychophysiologic insomnia: Secondary | ICD-10-CM

## 2020-07-15 MED ORDER — ZOLPIDEM TARTRATE 10 MG PO TABS
10.0000 mg | ORAL_TABLET | Freq: Every evening | ORAL | 0 refills | Status: DC | PRN
Start: 2020-07-26 — End: 2020-10-25

## 2020-07-23 ENCOUNTER — Encounter: Payer: Self-pay | Admitting: Orthopedic Surgery

## 2020-07-23 ENCOUNTER — Other Ambulatory Visit: Payer: Self-pay

## 2020-07-23 ENCOUNTER — Ambulatory Visit (INDEPENDENT_AMBULATORY_CARE_PROVIDER_SITE_OTHER): Payer: 59 | Admitting: Orthopedic Surgery

## 2020-07-23 ENCOUNTER — Ambulatory Visit: Payer: 59

## 2020-07-23 VITALS — BP 143/97 | HR 117 | Ht 63.0 in | Wt 272.0 lb

## 2020-07-23 DIAGNOSIS — Z6841 Body Mass Index (BMI) 40.0 and over, adult: Secondary | ICD-10-CM

## 2020-07-23 DIAGNOSIS — M25562 Pain in left knee: Secondary | ICD-10-CM

## 2020-07-23 DIAGNOSIS — S93401A Sprain of unspecified ligament of right ankle, initial encounter: Secondary | ICD-10-CM

## 2020-07-23 DIAGNOSIS — S82832A Other fracture of upper and lower end of left fibula, initial encounter for closed fracture: Secondary | ICD-10-CM | POA: Diagnosis not present

## 2020-07-23 NOTE — Patient Instructions (Signed)
Instructions  1.  You have sustained an ankle sprain  2.  I encourage you to stay on your feet and gradually remove your walking boot.   3.  Below are some exercises that you can complete on your own to improve your symptoms.  4.  As an alternative, you can search for ankle sprain exercises online, and can see some demonstrations on YouTube  5.  If you are having difficulty with these exercises, we can also prescribe formal physical therapy  Ankle Exercises Ask your health care provider which exercises are safe for you. Do exercises exactly as told by your health care provider and adjust them as directed. It is normal to feel mild stretching, pulling, tightness, or mild discomfort as you do these exercises. Stop right away if you feel sudden pain or your pain gets worse. Do not begin these exercises until told by your health care provider.  Stretching and range-of-motion exercises These exercises warm up your muscles and joints and improve the movement and flexibility of your ankle. These exercises may also help to relieve pain.  Dorsiflexion/plantar flexion  1. Sit with your R knee straight or bent. Do not rest your foot on anything. 2. Flex your left ankle to tilt the top of your foot toward your shin. This is called dorsiflexion. 3. Hold this position for 5 seconds. 4. Point your toes downward to tilt the top of your foot away from your shin. This is called plantar flexion. 5. Hold this position for 5 seconds. Repeat 10 times. Complete this exercise 2-3 times a day.  As tolerated  Ankle alphabet  1. Sit with your R foot supported at your lower leg. ? Do not rest your foot on anything. ? Make sure your foot has room to move freely. 2. Think of your R foot as a paintbrush: ? Move your foot to trace each letter of the alphabet in the air. Keep your hip and knee still while you trace the letters. Trace every letter from A to Z. ? Make the letters as large as you can without causing or  increasing any discomfort.  Repeat 2-3 times. Complete this exercise 2-3 times a day.   Strengthening exercises These exercises build strength and endurance in your ankle. Endurance is the ability to use your muscles for a long time, even after they get tired. Dorsiflexors These are muscles that lift your foot up. 1. Secure a rubber exercise band or tube to an object, such as a table leg, that will stay still when the band is pulled. Secure the other end around your R foot. 2. Sit on the floor, facing the object with your R leg extended. The band or tube should be slightly tense when your foot is relaxed. 3. Slowly flex your R ankle and toes to bring your foot toward your shin. 4. Hold this position for 5 seconds. 5. Slowly return your foot to the starting position, controlling the band as you do that. Repeat 10 times. Complete this exercise 2-3 times a day.  Plantar flexors These are muscles that push your foot down. 1. Sit on the floor with your R leg extended. 2. Loop a rubber exercise band or tube around the ball of your R foot. The ball of your foot is on the walking surface, right under your toes. The band or tube should be slightly tense when your foot is relaxed. 3. Slowly point your toes downward, pushing them away from you. 4. Hold this position for 5  seconds. 5. Slowly release the tension in the band or tube, controlling smoothly until your foot is back in the starting position. Repeat 10 times. Complete this exercise 2-3 times a day.  Towel curls  1. Sit in a chair on a non-carpeted surface, and put your feet on the floor. 2. Place a towel in front of your feet. 3. Keeping your heel on the floor, put your R foot on the towel. 4. Pull the towel toward you by grabbing the towel with your toes and curling them under. Keep your heel on the floor. 5. Let your toes relax. 6. Grab the towel again. Keep pulling the towel until it is completely underneath your foot. Repeat 10  times. Complete this exercise 2-3 times a day.  Standing plantar flexion This is an exercise in which you use your toes to lift your body's weight while standing. 1. Stand with your feet shoulder-width apart. 2. Keep your weight spread evenly over the width of your feet while you rise up on your toes. Use a wall or table to steady yourself if needed, but try not to use it for support. 3. If this exercise is too easy, try these options: ? Shift your weight toward your R leg until you feel challenged. ? If told by your health care provider, lift your uninjured leg off the floor. 4. Hold this position for 5 seconds. Repeat 10 times. Complete this exercise 2-3 times a day.  Tandem walking 1. Stand with one foot directly in front of the other. 2. Slowly raise your back foot up, lifting your heel before your toes, and place it directly in front of your other foot. 3. Continue to walk in this heel-to-toe way. Have a countertop or wall nearby to use if needed to keep your balance, but try not to hold onto anything for support.  Repeat 10 times. Complete this exercise 2-3 times a day.   Document Revised: 12/01/2017 Document Reviewed: 12/03/2017 [employer]vier Patient Education  2020 [employer]   

## 2020-07-23 NOTE — Progress Notes (Signed)
New Patient Visit  Assessment: Jenna Love is a 60 y.o. female with the following: 1.  Left proximal fibula fracture, minimally displaced 2.  Right ankle sprain  Plan: We had extensive discussion in regards to her current presentation.  Radiographs obtained today demonstrates a minimally displaced fracture of the proximal fibula.  She has been ambulating due to this injury, and I am not concerned about further displacement or instability.  As a result, she can continue with activities as tolerated.  Anticipate that this will take 6 to 8 weeks for full recovery.  In regards to her right ankle, she has minimal swelling and tenderness.  No ecchymosis is appreciated.  I provided her with ankle sprain exercises to improve the functional mobility in her ankle.  We also discussed the severity of the arthritis in her left knee, and we discussed that she is not currently a candidate for total knee arthroplasty given her BMI (48).  If she continues to have issues with her left knee, which is not currently bugging her, she can contact the clinic for follow-up appointment, with consideration for on injection.  We will plan to see her back in approximately 4 weeks for repeat evaluation.  We will obtain left knee x-rays at that time to evaluate the proximal left fibula fracture.  The patient meets the AMA guidelines for Morbid obesity with BMI > 40.  The patient has been counseled on weight loss.     Follow-up: Return in about 4 weeks (around 08/20/2020).  Subjective:  Chief Complaint  Patient presents with  . Leg Pain    Left lower leg pain after missing a few steps DOI 07/15/20  . Ankle Pain    Rt ankle pain after missing a few steps DOI 07/17/20    History of Present Illness: Jenna Love is a 60 y.o. female who presents for evaluation of left lower leg pain, as well as right right ankle pain.  She states that she had an injury, approximate 1 week ago to her left lateral knee area,  after missing a step.  She landed directly on her left knee.  Since then, she had difficulty with ambulation.  She continues to have pain.  The pain radiates distally on the lateral aspect of her leg.  She subsequently missed a step, and fell twisting her right ankle.  She states that she has sprained her ankle several times in the past.  The pain in her right ankle has improved, but she continues to have swelling.  She is not taking medications on a consistent basis.  She is not using anything to assist with ambulation.  No numbness or tingling distally.   Review of Systems: No fevers or chills No numbness or tingling No chest pain No shortness of breath No bowel or bladder dysfunction No GI distress No headaches   Medical History:  Past Medical History:  Diagnosis Date  . Anxiety   . Chronic cough 05/20/2013  . Panic attacks   . Pneumonia   . Thyroid nodule 2013   Benign biopsy- ENT Dr. Pollyann Kennedy    Past Surgical History:  Procedure Laterality Date  . CESAREAN SECTION  1992  . COLONOSCOPY WITH PROPOFOL N/A 11/05/2014   One 5 mm sessile polyp in base of cecum. otherwise normal. fecal material on path. Surveillance in 5 years.   Marland Kitchen HYSTEROSCOPY WITH D & C N/A 10/03/2013   Procedure: DILATATION AND CURETTAGE /HYSTEROSCOPY;  Surgeon: Tilda Burrow, MD;  Location: [facility];  Service:  Gynecology;  Laterality: N/A;  . HYSTEROSCOPY WITH D & C N/A 11/05/2019   Procedure: DILATATION AND CURETTAGE HYSTEROSCOPY;  Surgeon: Tilda Burrow, MD;  Location: [facility];  Service: Gynecology;  Laterality: N/A;  . POLYPECTOMY N/A 10/03/2013   Procedure: REMOVAL OF ENDOMETRIAL POLYP;  Surgeon: Tilda Burrow, MD;  Location: [facility];  Service: Gynecology;  Laterality: N/A;  . POLYPECTOMY N/A 11/05/2014   Procedure: POLYPECTOMY;  Surgeon: Corbin Ade, MD;  Location: [facility];  Service: Endoscopy;  Laterality: N/A;  . uterus polyp    . VULVA /PERINEUM BIOPSY Right 11/05/2019   Procedure: VULVAR BIOPSY;  Surgeon:  Tilda Burrow, MD;  Location: [facility];  Service: Gynecology;  Laterality: Right;    Family History  Problem Relation Age of Onset  . Diabetes Mother   . Alzheimer's disease Mother   . Cancer Father        lung   . Cancer Paternal Grandfather   . Hodgkin's lymphoma Son   . Colon cancer Neg Hx   . Colon polyps Neg Hx    Social History   Tobacco Use  . Smoking status: Never Smoker  . Smokeless tobacco: Never Used  Vaping Use  . Vaping Use: Never used  Substance Use Topics  . Alcohol use: No  . Drug use: No    No Known Allergies  Current Meds  Medication Sig  . Cholecalciferol (VITAMIN D) 2000 units CAPS Take by mouth.  . ciprofloxacin-dexamethasone (CIPRODEX) OTIC suspension Place 4 drops into both ears 2 (two) times daily.  . clonazePAM (KLONOPIN) 1 MG tablet TAKE 1 TABLET BY MOUTH 3 TIMES A DAY AS NEEDED FOR ANXIETY.  . famotidine (PEPCID) 20 MG tablet Take 1 tablet (20 mg total) by mouth daily.  . furosemide (LASIX) 20 MG tablet TAKE (1) TABLET BY MOUTH ONCE daily prn  . ibuprofen (ADVIL) 600 MG tablet Take 1 tablet (600 mg total) by mouth every 8 (eight) hours as needed.  . ondansetron (ZOFRAN) 4 MG tablet Take 1 tablet (4 mg total) by mouth every 8 (eight) hours as needed for nausea or vomiting.  . sertraline (ZOLOFT) 100 MG tablet Take 1.5 tablets (150 mg total) by mouth daily.  . vitamin E 400 UNIT capsule Take 400 Units by mouth daily.  Melene Muller ON 07/26/2020] zolpidem (AMBIEN) 10 MG tablet Take 1 tablet (10 mg total) by mouth at bedtime as needed. for insomnia    Objective: BP (!) 143/97   Pulse (!) 117   Ht 5\' 3"  (1.6 m)   Wt 272 lb (123.4 kg)   LMP 05/25/2014   BMI 48.18 kg/m   Physical Exam:  General: Alert and oriented.  No acute distress.  Obese female. Gait: Left-sided antalgic gait.  Evaluation of left knee demonstrates no effusion.  No bruising is appreciated.  She has tenderness to palpation over the proximal fibula.  No tenderness palpation  within the medial lateral joint lines.  Negative Lachman.  No increased laxity varus valgus stress.  Tenderness to palpation along the lateral compartment.  Sensation is intact distally.  Active motion intact in the TA/EHL/GS.  Toes are warm and well perfused.  Evaluation of the right ankle demonstrates some mild swelling over the lateral aspect.  Mild tenderness to palpation in this area.  No ecchymosis is appreciated.  She does have a tight heel cord, with difficulty getting to a plantigrade position with her knee fully extended.  10 degrees of dorsiflexion with her knee flexed.  Toes are warm and well-perfused.    IMAGING: I personally ordered and reviewed the following images   X-rays of the left knee were obtained today in clinic, and these demonstrates a small, oblique fracture through the proximal head of fibula.  The fracture is minimally displaced.  There is no obvious and dislocation or disruption at the proximal tibia-fibular joint.  There is severe joint space narrowing within the medial compartment.  Subchondral sclerosis and osteophytes are visible.  Varus alignment of the knee.  Impression: Severe left knee arthritis with acute proximal fibula fracture, nondisplaced.   New Medications:  No orders of the defined types were placed in this encounter.     Oliver Barre, MD  07/23/2020 2:38 PM

## 2020-08-20 ENCOUNTER — Ambulatory Visit: Payer: 59 | Admitting: Orthopedic Surgery

## 2020-08-20 NOTE — Telephone Encounter (Signed)
Scheduled 08/26/20

## 2020-08-25 ENCOUNTER — Encounter: Payer: Self-pay | Admitting: Internal Medicine

## 2020-08-26 ENCOUNTER — Other Ambulatory Visit: Payer: Self-pay | Admitting: *Deleted

## 2020-08-26 ENCOUNTER — Other Ambulatory Visit: Payer: Self-pay

## 2020-08-26 ENCOUNTER — Encounter: Payer: Self-pay | Admitting: Internal Medicine

## 2020-08-26 ENCOUNTER — Telehealth (INDEPENDENT_AMBULATORY_CARE_PROVIDER_SITE_OTHER): Payer: 59 | Admitting: Licensed Clinical Social Worker

## 2020-08-26 DIAGNOSIS — F411 Generalized anxiety disorder: Secondary | ICD-10-CM

## 2020-08-26 MED ORDER — CLONAZEPAM 1 MG PO TABS: ORAL_TABLET | ORAL | 1 refills | Status: AC

## 2020-08-31 NOTE — Progress Notes (Signed)
Writer noticed that Patient had logged into the video visit at the start of the session but logged off soon after.  Writer attempted to call the patient for her session but was unsuccessful.  Left message encouraging contact and how to reschedule.

## 2020-09-16 ENCOUNTER — Other Ambulatory Visit: Payer: Self-pay

## 2020-09-16 ENCOUNTER — Ambulatory Visit (INDEPENDENT_AMBULATORY_CARE_PROVIDER_SITE_OTHER): Payer: 59 | Admitting: Licensed Clinical Social Worker

## 2020-09-16 DIAGNOSIS — F411 Generalized anxiety disorder: Secondary | ICD-10-CM

## 2020-09-16 NOTE — Progress Notes (Signed)
[place] [facility] Follow Up Assessment  MRN: [id number] NAME: Jenna Love Date: 09/17/20  Start time: 130p End time: 145p Total time: 15  Type of Contact: Follow up Call  Current concerns/stressors: continued anxiety  Screens/Assessment Tools:  PHQ-9 & GAD-7 Assessments: This is an evidence based assessment tool for depression and anxiety symptoms in adolescents and adults.  Score cut-off points for each section are as follows: 5-9: Mild, 10-14: Moderate, 15+: Severe  PHQ-9 for Depression = 10   GAD-7 for Anxiety = 15   How difficult have these problems made it for you to do your work, take care of things at home, or get along with other people? difficult  Functional Assessment:  Sleep: poor Appetite: poor Coping ability: overwhelmed Patient taking medications as prescribed:    Current medications:  Outpatient Encounter Medications as of 09/16/2020  Medication Sig   Cholecalciferol (VITAMIN D) 2000 units CAPS Take by mouth.   ciprofloxacin-dexamethasone (CIPRODEX) OTIC suspension Place 4 drops into both ears 2 (two) times daily.   clonazePAM (KLONOPIN) 1 MG tablet TAKE 1 TABLET BY MOUTH 3 TIMES A DAY AS NEEDED FOR ANXIETY.   famotidine (PEPCID) 20 MG tablet Take 1 tablet (20 mg total) by mouth daily.   furosemide (LASIX) 20 MG tablet TAKE (1) TABLET BY MOUTH ONCE daily prn   ibuprofen (ADVIL) 600 MG tablet Take 1 tablet (600 mg total) by mouth every 8 (eight) hours as needed.   ondansetron (ZOFRAN) 4 MG tablet Take 1 tablet (4 mg total) by mouth every 8 (eight) hours as needed for nausea or vomiting.   sertraline (ZOLOFT) 100 MG tablet Take 1.5 tablets (150 mg total) by mouth daily.   vitamin E 400 UNIT capsule Take 400 Units by mouth daily.   zolpidem (AMBIEN) 10 MG tablet Take 1 tablet (10 mg total) by mouth at bedtime as needed. for insomnia   No facility-administered encounter medications on file as of 09/16/2020.    Self-harm and/or Suicidal Behaviors  Risk Assessment Self-harm risk factors: no Patient endorses recent self injurious thoughts and/or behaviors: No   Suicide ideations: No plan to harm self or others   Danger to Others Risk Assessment Danger to others risk factors: no Patient endorses recent thoughts of harming others: No    Substance Use Assessment Patient recently consumed alcohol: No  Patient recently used drugs: No  Patient is concerned about dependence or abuse of substances: No    Goals, Interventions and Follow-up Plan Goals: Increase healthy adjustment to current life circumstances and Begin healthy grieving over loss Interventions: Mindfulness or Relaxation Training   Summary of Clinical Assessment  Jenna Love is a 60 year old woman who presented today for video visit at the office.  She reports that she continues to have some anxiety but the intensity has decreased since the death of her husband.  Husband died 08-21-20. She reports that her anxiety has decreased due to being overwhelmed with caring for husband.  She is open to trying behvaioral activation and relaxation training. She is currently taking her medication and wants therapy interventions at this time   Follow-up Plan:  monthly VBH sessions Marinda Elk, LCSW

## 2020-09-22 ENCOUNTER — Telehealth: Payer: Self-pay | Admitting: Licensed Clinical Social Worker

## 2020-09-22 DIAGNOSIS — F411 Generalized anxiety disorder: Secondary | ICD-10-CM

## 2020-09-22 NOTE — BH Specialist Note (Signed)
[facility] Treatment Plan Team Note  MRN: [id number] NAME: Jenna Love  DATE: 09/28/20  Start time:  405p  End time:  410 p Total time:  5 min  Total number of Virtual BH Treatment Team Plan encounters: 1/4  Treatment Team Attendees: Nolon Rod, LCSW; Dr. Vanetta Shawl, Psychiatrist  Diagnoses:    ICD-10-CM   1. GAD (generalized anxiety disorder)  F41.1       Goals, Interventions and Follow-up Plan Goals: Increase healthy adjustment to current life circumstances Begin healthy grieving over loss Interventions: Mindfulness or Relaxation Training Medication Management Recommendations: up titrate Sertraline to 200mg  daily Follow-up Plan: monthly VBH sessions  History of the present illness Presenting Problem/Current Symptoms: continued symptoms of her diagnosis  Psychiatric History  Depression: Yes Anxiety: Yes Mania: No Psychosis: No PTSD symptoms: No  Past Psychiatric History/Hospitalization(s): Hospitalization for psychiatric illness: No Prior Suicide Attempts: No Prior Self-injurious behavior: No  Psychosocial stressors bereavement; husband died about 1 month ago  Self-harm Behaviors Risk Assessment none  Screenings PHQ-9 Assessments:  Depression screen [facility] 2/9 05/27/2020 05/24/2020 10/21/2019  Decreased Interest 0 0 0  Down, Depressed, Hopeless 0 0 0  PHQ - 2 Score 0 0 0  Altered sleeping - 3 -  Tired, decreased energy - 3 -  Change in appetite - 3 -  Feeling bad or failure about yourself  - 0 -  Trouble concentrating - 0 -  Moving slowly or fidgety/restless - 3 -  Suicidal thoughts - 0 -  PHQ-9 Score - 12 -  Difficult doing work/chores Not difficult at all Somewhat difficult -  Some recent data might be hidden   GAD-7 Assessments: No flowsheet data found.  Past Medical History Past Medical History:  Diagnosis Date   Anxiety    Chronic cough 05/20/2013   Panic attacks    Pneumonia    Thyroid nodule 2013   Benign biopsy- ENT Dr.  2014    Vital signs: There were no vitals filed for this visit.  Allergies:  Allergies as of 09/22/2020   (No Known Allergies)    Medication History Current medications:  Outpatient Encounter Medications as of 09/22/2020  Medication Sig   Cholecalciferol (VITAMIN D) 2000 units CAPS Take by mouth.   ciprofloxacin-dexamethasone (CIPRODEX) OTIC suspension Place 4 drops into both ears 2 (two) times daily.   clonazePAM (KLONOPIN) 1 MG tablet TAKE 1 TABLET BY MOUTH 3 TIMES A DAY AS NEEDED FOR ANXIETY.   famotidine (PEPCID) 20 MG tablet Take 1 tablet (20 mg total) by mouth daily.   furosemide (LASIX) 20 MG tablet TAKE (1) TABLET BY MOUTH ONCE daily prn   ibuprofen (ADVIL) 600 MG tablet Take 1 tablet (600 mg total) by mouth every 8 (eight) hours as needed.   ondansetron (ZOFRAN) 4 MG tablet Take 1 tablet (4 mg total) by mouth every 8 (eight) hours as needed for nausea or vomiting.   sertraline (ZOLOFT) 100 MG tablet Take 1.5 tablets (150 mg total) by mouth daily.   vitamin E 400 UNIT capsule Take 400 Units by mouth daily.   zolpidem (AMBIEN) 10 MG tablet Take 1 tablet (10 mg total) by mouth at bedtime as needed. for insomnia   No facility-administered encounter medications on file as of 09/22/2020.     Scribe for Treatment Team: 11/23/2020, LCSW

## 2020-09-22 NOTE — Progress Notes (Signed)
[facility] Psychiatric Consultant Case Review   Jenna Love is a 60 y.o. year old female with a history of anxiety, depression, insomnia, obesity.  Worsening in depression in the setting of loss of her husband in May 2022. She is unemployed, and lives by herself.  She stays in the bed most of the time, bathes a few times per week. She takes care of her grandchildren at times.    Assessment/Provisional Diagnosis # MDD Consider up titration of sertraline to optimize treatment for depression.   Recommendation - Increase sertraline 200 mg daily  - Referral to IOP - She is on clonazepam 1 mg three times a day as needed for anxiety. Would recommend limiting use of clonazepam only for a short term.    Thank you for your consult. We will continue to follow the patient. Please contact [facility]  for any questions or concerns.   The above treatment considerations and suggestions are based on consultation with the [facility] specialist and/or PCP and a review of information available in the shared registry and the patient's Electronic Health Record (EHR). I have not personally examined the patient. All recommendations should be implemented with consideration of the patient's relevant prior history and current clinical status. Please feel free to call me with any questions about the care of this patient.

## 2020-09-23 ENCOUNTER — Ambulatory Visit: Payer: Managed Care, Other (non HMO) | Admitting: Internal Medicine

## 2020-10-14 ENCOUNTER — Telehealth (INDEPENDENT_AMBULATORY_CARE_PROVIDER_SITE_OTHER): Payer: 59 | Admitting: Licensed Clinical Social Worker

## 2020-10-14 ENCOUNTER — Other Ambulatory Visit: Payer: Self-pay

## 2020-10-14 DIAGNOSIS — Z91199 Patient's noncompliance with other medical treatment and regimen due to unspecified reason: Secondary | ICD-10-CM

## 2020-10-14 DIAGNOSIS — Z5329 Procedure and treatment not carried out because of patient's decision for other reasons: Secondary | ICD-10-CM

## 2020-10-14 NOTE — Progress Notes (Signed)
No show

## 2020-10-23 ENCOUNTER — Ambulatory Visit
Admission: EM | Admit: 2020-10-23 | Discharge: 2020-10-23 | Disposition: A | Discharge status: Home / Self Care | Payer: 59 | Attending: Physician Assistant | Admitting: Physician Assistant

## 2020-10-23 ENCOUNTER — Other Ambulatory Visit: Payer: Self-pay

## 2020-10-23 ENCOUNTER — Encounter: Payer: Self-pay | Admitting: Emergency Medicine

## 2020-10-23 DIAGNOSIS — Z20822 Contact with and (suspected) exposure to covid-19: Secondary | ICD-10-CM

## 2020-10-23 DIAGNOSIS — J069 Acute upper respiratory infection, unspecified: Secondary | ICD-10-CM | POA: Diagnosis not present

## 2020-10-23 MED ORDER — ONDANSETRON HCL 4 MG PO TABS: 4.0000 mg | ORAL_TABLET | Freq: Four times a day (QID) | ORAL | 0 refills | Status: DC

## 2020-10-23 MED ORDER — KETOROLAC TROMETHAMINE 30 MG/ML IJ SOLN
30.0000 mg | Freq: Once | INTRAMUSCULAR | Status: AC
Start: 2020-10-23 — End: 2020-10-23
  Administered 2020-10-23: 30 mg via INTRAMUSCULAR

## 2020-10-23 MED ORDER — ONDANSETRON 4 MG PO TBDP
4.0000 mg | ORAL_TABLET | Freq: Once | ORAL | Status: AC
  Administered 2020-10-23: 4 mg via ORAL

## 2020-10-23 NOTE — ED Provider Notes (Signed)
[facility]    CSN: [id number] Arrival date & time: 10/23/20  0804      History   Chief Complaint No chief complaint on file.   HPI Jenna Love is a 60 y.o. female.   Pt complains of n/v/d, congestion, ear pressure, sore throat, and body aches that started a few days ago, became worse last night.  She reports a fever of 102 at home.  She is taking Advil with no relief.  She denies cough, shortness of breath, chest pain.  Pt reports someone at work had COVID last two weeks.  She has had her COVID vaccine.  She has not taken a home test.    Past Medical History:  Diagnosis Date  . Anxiety   . Chronic cough 05/20/2013  . Panic attacks   . Pneumonia   . Thyroid nodule 2013   Benign biopsy- ENT Dr. Pollyann Kennedy    Patient Active Problem List   Diagnosis Date Noted  . Encounter to establish care 05/24/2020  . Non-recurrent acute suppurative otitis media of both ears without spontaneous rupture of tympanic membranes 05/24/2020  . Chronic fatigue 05/24/2020  . Constipation 09/18/2019  . Pelvic pain 06/10/2019  . Encounter for gynecological examination with Papanicolaou smear of cervix 10/22/2018  . Fatty liver disease, nonalcoholic 04/01/2018  . Pulmonary nodule, left 04/12/2016  . History of colonic polyps   . Encounter for screening colonoscopy 10/13/2014  . High risk medication use 10/13/2014  . Peripheral edema 05/06/2014  . Thyroid nodule 04/15/2013  . Hypertriglyceridemia 02/21/2013  . MDD (major depressive disorder) (HCC) 06/29/2011  . Menorrhagia 06/15/2011  . Insomnia 04/14/2011  . Class 3 obesity 04/14/2011  . GAD (generalized anxiety disorder) 10/23/2009    Past Surgical History:  Procedure Laterality Date  . CESAREAN SECTION  1992  . COLONOSCOPY WITH PROPOFOL N/A 11/05/2014   One 5 mm sessile polyp in base of cecum. otherwise normal. fecal material on path. Surveillance in 5 years.   Marland Kitchen HYSTEROSCOPY WITH D & C N/A 10/03/2013   Procedure:  DILATATION AND CURETTAGE /HYSTEROSCOPY;  Surgeon: Tilda Burrow, MD;  Location: [facility];  Service: Gynecology;  Laterality: N/A;  . HYSTEROSCOPY WITH D & C N/A 11/05/2019   Procedure: DILATATION AND CURETTAGE HYSTEROSCOPY;  Surgeon: Tilda Burrow, MD;  Location: [facility];  Service: Gynecology;  Laterality: N/A;  . POLYPECTOMY N/A 10/03/2013   Procedure: REMOVAL OF ENDOMETRIAL POLYP;  Surgeon: Tilda Burrow, MD;  Location: [facility];  Service: Gynecology;  Laterality: N/A;  . POLYPECTOMY N/A 11/05/2014   Procedure: POLYPECTOMY;  Surgeon: Corbin Ade, MD;  Location: [facility];  Service: Endoscopy;  Laterality: N/A;  . uterus polyp    . VULVA /PERINEUM BIOPSY Right 11/05/2019   Procedure: VULVAR BIOPSY;  Surgeon: Tilda Burrow, MD;  Location: [facility];  Service: Gynecology;  Laterality: Right;    OB History     Gravida  2   Para  2   Term  2   Preterm      AB      Living  2      SAB      IAB      Ectopic      Multiple      Live Births  2            Home Medications    Prior to Admission medications   Medication Sig Start Date End Date Taking? Authorizing Provider  Cholecalciferol (VITAMIN D) 2000 units  CAPS Take by mouth.    [provider]  ciprofloxacin-dexamethasone (CIPRODEX) OTIC suspension Place 4 drops into both ears 2 (two) times daily. 05/24/20   Anabel Halon, MD  clonazePAM (KLONOPIN) 1 MG tablet TAKE 1 TABLET BY MOUTH 3 TIMES A DAY AS NEEDED FOR ANXIETY. 08/26/20   Anabel Halon, MD  famotidine (PEPCID) 20 MG tablet Take 1 tablet (20 mg total) by mouth daily. 05/27/20   Anabel Halon, MD  furosemide (LASIX) 20 MG tablet TAKE (1) TABLET BY MOUTH ONCE daily prn 05/13/20   Salley Scarlet, MD  ibuprofen (ADVIL) 600 MG tablet Take 1 tablet (600 mg total) by mouth every 8 (eight) hours as needed. 03/15/20   Salley Scarlet, MD  ondansetron (ZOFRAN) 4 MG tablet Take 1 tablet (4 mg total) by mouth every 8 (eight) hours as needed for nausea or  vomiting. 05/27/20   Anabel Halon, MD  sertraline (ZOLOFT) 100 MG tablet Take 1.5 tablets (150 mg total) by mouth daily. 05/13/20   Salley Scarlet, MD  vitamin E 400 UNIT capsule Take 400 Units by mouth daily.    [provider]  zolpidem (AMBIEN) 10 MG tablet Take 1 tablet (10 mg total) by mouth at bedtime as needed. for insomnia 07/26/20   Anabel Halon, MD    Family History Family History  Problem Relation Age of Onset  . Diabetes Mother   . Alzheimer's disease Mother   . Cancer Father        lung   . Cancer Paternal Grandfather   . Hodgkin's lymphoma Son   . Colon cancer Neg Hx   . Colon polyps Neg Hx     Social History Social History   Tobacco Use  . Smoking status: Never  . Smokeless tobacco: Never  Vaping Use  . Vaping Use: Never used  Substance Use Topics  . Alcohol use: No  . Drug use: No     Allergies   Patient has no known allergies.   Review of Systems Review of Systems  Constitutional:  Positive for fever. Negative for chills.  HENT:  Positive for congestion, ear pain and sinus pressure. Negative for sore throat.   Eyes:  Negative for pain and visual disturbance.  Respiratory:  Negative for cough and shortness of breath.   Cardiovascular:  Negative for chest pain and palpitations.  Gastrointestinal:  Positive for diarrhea and nausea. Negative for abdominal pain and vomiting.  Genitourinary:  Negative for dysuria and hematuria.  Musculoskeletal:  Positive for myalgias. Negative for arthralgias and back pain.  Skin:  Negative for color change and rash.  Neurological:  Negative for seizures and syncope.  All other systems reviewed and are negative.   Physical Exam Triage Vital Signs ED Triage Vitals  Enc Vitals Group     BP 10/23/20 0815 121/87     Pulse Rate 10/23/20 0815 (!) 117     Resp 10/23/20 0815 16     Temp 10/23/20 0815 99.7 F (37.6 C)     Temp Source 10/23/20 0815 Tympanic     SpO2 10/23/20 0815 92 %     Weight --       Height --      Head Circumference --      Peak Flow --      Pain Score 10/23/20 0816 8     Pain Loc --      Pain Edu? --      Excl. in GC? --  No data found.  Updated Vital Signs BP 121/87 (BP Location: Right Arm)   Pulse (!) 117   Temp 99.7 F (37.6 C) (Tympanic)   Resp 16   LMP 05/25/2014   SpO2 92%   Visual Acuity Right Eye Distance:   Left Eye Distance:   Bilateral Distance:    Right Eye Near:   Left Eye Near:    Bilateral Near:     Physical Exam Vitals and nursing note reviewed.  Constitutional:      General: She is not in acute distress.    Appearance: She is well-developed.  HENT:     Head: Normocephalic and atraumatic.     Right Ear: Hearing normal. Tympanic membrane is bulging.     Left Ear: Hearing normal. Tympanic membrane is bulging.  Eyes:     Conjunctiva/sclera: Conjunctivae normal.  Cardiovascular:     Rate and Rhythm: Normal rate and regular rhythm.     Heart sounds: No murmur heard. Pulmonary:     Effort: Pulmonary effort is normal. No respiratory distress.     Breath sounds: Normal breath sounds.  Abdominal:     Palpations: Abdomen is soft.     Tenderness: There is no abdominal tenderness.  Musculoskeletal:     Cervical back: Neck supple.  Skin:    General: Skin is warm and dry.  Neurological:     Mental Status: She is alert.     UC Treatments / Results  Labs (all labs ordered are listed, but only abnormal results are displayed) Labs Reviewed - No data to display  EKG   Radiology No results found.  Procedures Procedures (including critical care time)  Medications Ordered in UC Medications - No data to display  Initial Impression / Assessment and Plan / UC Course  I have reviewed the triage vital signs and the nursing notes.  Pertinent labs & imaging results that were available during my care of the patient were reviewed by me and considered in my medical decision making (see chart for details).    URI-COVID test  pending.  Pt complains of facial pain, ear pain, headache; toradol given.  Zofran given in clinic to nausea.  Advised Mucinex and Flonase at home.  Self isolation instructions reviewed. Return precautions discussed.  Final Clinical Impressions(s) / UC Diagnoses   Final diagnoses:  None   Discharge Instructions   None    ED Prescriptions   None    PDMP not reviewed this encounter.   Jodell Cipro, PA-C 10/23/20 [phone]

## 2020-10-23 NOTE — ED Notes (Signed)
Patient drinking water 

## 2020-10-23 NOTE — Discharge Instructions (Addendum)
Recommend Mucinex and Flonase use Take Zofran as needed for nausea Drink plenty of fluids Can take tylenol as needed COVID test pending; if positive self isolate 5 days from symptom onset and then wear a mask around others for 5 days.

## 2020-10-23 NOTE — ED Triage Notes (Signed)
Ears have been hurting for a few days.  Last night pain became worse.  States her teeth hurt with facial pressure.

## 2020-10-24 ENCOUNTER — Encounter: Payer: Self-pay | Admitting: Internal Medicine

## 2020-10-25 ENCOUNTER — Other Ambulatory Visit: Payer: Self-pay

## 2020-10-25 ENCOUNTER — Encounter: Payer: Self-pay | Admitting: Internal Medicine

## 2020-10-25 ENCOUNTER — Other Ambulatory Visit: Payer: Self-pay | Admitting: Internal Medicine

## 2020-10-25 ENCOUNTER — Telehealth (INDEPENDENT_AMBULATORY_CARE_PROVIDER_SITE_OTHER): Payer: 59 | Admitting: Internal Medicine

## 2020-10-25 DIAGNOSIS — F5104 Psychophysiologic insomnia: Secondary | ICD-10-CM

## 2020-10-25 DIAGNOSIS — U071 COVID-19: Secondary | ICD-10-CM

## 2020-10-25 LAB — SARS-COV-2, NAA 2 DAY TAT

## 2020-10-25 LAB — NOVEL CORONAVIRUS, NAA: SARS-CoV-2, NAA: DETECTED — AB

## 2020-10-25 MED ORDER — ZOLPIDEM TARTRATE 10 MG PO TABS: 10.0000 mg | ORAL_TABLET | Freq: Every evening | ORAL | 0 refills | Status: AC | PRN

## 2020-10-25 MED ORDER — ALBUTEROL SULFATE HFA 108 (90 BASE) MCG/ACT IN AERS
2.0000 | INHALATION_SPRAY | Freq: Four times a day (QID) | RESPIRATORY_TRACT | 0 refills | Status: DC | PRN
Start: 2020-10-25 — End: 2021-03-24

## 2020-10-25 MED ORDER — NIRMATRELVIR/RITONAVIR (PAXLOVID)TABLET: 3.0000 | ORAL_TABLET | Freq: Two times a day (BID) | ORAL | 0 refills | Status: AC

## 2020-10-25 NOTE — Progress Notes (Signed)
Virtual Visit via Telephone Note   This visit type was conducted due to national recommendations for restrictions regarding the COVID-19 Pandemic (e.g. social distancing) in an effort to limit this patient's exposure and mitigate transmission in our community.  Due to her co-morbid illnesses, this patient is at least at moderate risk for complications without adequate follow up.  This format is felt to be most appropriate for this patient at this time.  The patient did not have access to video technology/had technical difficulties with video requiring transitioning to audio format only (telephone).  All issues noted in this document were discussed and addressed.  No physical exam could be performed with this format.   Evaluation Performed:  Follow-up visit  Date:  10/25/2020   ID:  Jenna, Love Sep 24, 1960, MRN [id number]  Patient Location: Home Provider Location: Office  Participants: Patient Location of Patient: Home Location of Provider: Telehealth Consent was obtain for visit to be over via telehealth. I verified that I am speaking with the correct person using two identifiers.  PCP:  Anabel Halon, MD   Chief Complaint:  Cough and fatigue  History of Present Illness:    Jenna Love is a 60 y.o. female who has a televisit for c/o cough, fever and fatigue for last 3 days. She tested positive for COVID in Urgent care, where she was advised to take Mucinex and Flonase. Her symptoms are persistent. She also has mild dyspnea upon exertion. She denies any chest pain or palpitations.  The patient does have symptoms concerning for COVID-19 infection (fever, chills, cough, or new shortness of breath).   Past Medical, Surgical, Social History, Allergies, and Medications have been Reviewed.  Past Medical History:  Diagnosis Date   Anxiety    Chronic cough 05/20/2013   Panic attacks    Pneumonia    Thyroid nodule 2013   Benign biopsy- ENT Dr. Pollyann Kennedy   Past  Surgical History:  Procedure Laterality Date   CESAREAN SECTION  1992   COLONOSCOPY WITH PROPOFOL N/A 11/05/2014   One 5 mm sessile polyp in base of cecum. otherwise normal. fecal material on path. Surveillance in 5 years.    HYSTEROSCOPY WITH D & C N/A 10/03/2013   Procedure: DILATATION AND CURETTAGE /HYSTEROSCOPY;  Surgeon: Tilda Burrow, MD;  Location: [facility];  Service: Gynecology;  Laterality: N/A;   HYSTEROSCOPY WITH D & C N/A 11/05/2019   Procedure: DILATATION AND CURETTAGE HYSTEROSCOPY;  Surgeon: Tilda Burrow, MD;  Location: [facility];  Service: Gynecology;  Laterality: N/A;   POLYPECTOMY N/A 10/03/2013   Procedure: REMOVAL OF ENDOMETRIAL POLYP;  Surgeon: Tilda Burrow, MD;  Location: [facility];  Service: Gynecology;  Laterality: N/A;   POLYPECTOMY N/A 11/05/2014   Procedure: POLYPECTOMY;  Surgeon: Corbin Ade, MD;  Location: [facility];  Service: Endoscopy;  Laterality: N/A;   uterus polyp     VULVA /PERINEUM BIOPSY Right 11/05/2019   Procedure: VULVAR BIOPSY;  Surgeon: Tilda Burrow, MD;  Location: [facility];  Service: Gynecology;  Laterality: Right;     Current Meds  Medication Sig   nirmatrelvir/ritonavir EUA (PAXLOVID) TABS Take 3 tablets by mouth 2 (two) times daily for 5 days. (Take nirmatrelvir 150 mg two tablets twice daily for 5 days and ritonavir 100 mg one tablet twice daily for 5 days) Patient GFR is 83.     Allergies:   Patient has no known allergies.   ROS:   Please see the history of present  illness.     All other systems reviewed and are negative.   Labs/Other Tests and Data Reviewed:    Recent Labs: 05/24/2020: ALT 31; BUN 17; Creatinine, Ser 0.82; Hemoglobin 14.1; Platelets 236; Potassium 4.6; Sodium 140; TSH 2.090   Recent Lipid Panel Lab Results  Component Value Date/Time   CHOL 173 05/24/2020 09:17 AM   TRIG 100 05/24/2020 09:17 AM   HDL 59 05/24/2020 09:17 AM   CHOLHDL 2.9 05/24/2020 09:17 AM   CHOLHDL 2.9 10/15/2019 08:33 AM   LDLCALC 96 05/24/2020  09:17 AM   LDLCALC 86 10/15/2019 08:33 AM    Wt Readings from Last 3 Encounters:  07/23/20 272 lb (123.4 kg)  05/24/20 269 lb 6.4 oz (122.2 kg)  03/01/20 268 lb (121.6 kg)      ASSESSMENT & PLAN:    COVID-19 infection Started Paxlovid Discussed about possibility of rebound symptoms as she had concern of it Mucinex or Robitussin for cough Albuterol inhaler PRN for mild dyspnea, will add steroid if persistent concern  Time:   Today, I have spent 9 minutes reviewing the chart, including problem list, medications, and with the patient with telehealth technology discussing the above problems.   Medication Adjustments/Labs and Tests Ordered: Current medicines are reviewed at length with the patient today.  Concerns regarding medicines are outlined above.   Tests Ordered: No orders of the defined types were placed in this encounter.   Medication Changes: Meds ordered this encounter  Medications   nirmatrelvir/ritonavir EUA (PAXLOVID) TABS    Sig: Take 3 tablets by mouth 2 (two) times daily for 5 days. (Take nirmatrelvir 150 mg two tablets twice daily for 5 days and ritonavir 100 mg one tablet twice daily for 5 days) Patient GFR is 83.    Dispense:  30 tablet    Refill:  0     Note: This dictation was prepared with Dragon dictation along with smaller phrase technology. Similar sounding words can be transcribed inadequately or may not be corrected upon review. Any transcriptional errors that result from this process are unintentional.      Disposition:  Follow up  Signed, Anabel Halon, MD  10/25/2020 10:12 AM     [name]

## 2020-10-25 NOTE — Telephone Encounter (Signed)
Pt put in on schedule 10-25-20

## 2020-10-26 ENCOUNTER — Ambulatory Visit: Payer: 59 | Admitting: Internal Medicine

## 2020-10-27 ENCOUNTER — Encounter: Payer: Self-pay | Admitting: Internal Medicine

## 2020-10-28 ENCOUNTER — Encounter: Payer: Self-pay | Admitting: Internal Medicine

## 2020-10-28 ENCOUNTER — Encounter: Payer: Self-pay | Admitting: *Deleted

## 2020-10-28 NOTE — Telephone Encounter (Signed)
Appointment scheduled.

## 2020-11-25 ENCOUNTER — Ambulatory Visit: Payer: 59 | Admitting: Internal Medicine

## 2021-02-13 NOTE — Progress Notes (Signed)
Referring Provider: Lindell Spar, MD Primary Care Physician:  Lindell Spar, MD Primary GI Physician: Dr. Gala Romney   Chief Complaint  Patient presents with   Abdominal Pain    Sometimes feels like need to have bowel movement but can't and then sometimes it is loose and watery     HPI:   Jenna Love is a 60 y.o. female presenting today with chief complaint of change in bowel habits and upper abdominal pain. Notably, she is overdue for surveillance colonoscopy.  Last colonoscopy in 2016 with 5 mm sessile polyp, recommended repeat in 5 years.   Last seen in our office 09/18/2019.  She reported 1 month history of sluggish bowels and unproductive bowel movements, using mineral oil as needed.  Postprandial urgency at times.  Also with pressure and fullness in her lower abdomen that improves with a bowel movement.  No other significant GI symptoms.  Recommended Benefiber daily.  May add Linzess 72 mcg daily if Benefiber is not helpful.  She was provided samples of Linzess.  Also plan to arrange colonoscopy for surveillance purposes.  Patient called to cancel colonoscopy.  Today: Reports epigastric and left upper quadrant burning as well as change in bowel movements x2 months. Used to always have a BM in the morning. Now no BM for up to 3 days, when her bowels move, they are mushy with 3-4 BMs per 24 hours. No brbpr or melena.  Regarding her abdominal pain, she can wake up with pain, or developing postprandial.  Sometimes she may develop pain prior to bowel movement, but her pain does not improve with a bowel movement.  Pain can last for hours or a few minutes. No specific food triggers.  Typically occurring 2-3 days a week. Takes pepcid occasionally which does help. No nausea or vomiting. No GERD or dysphagia.  She also wonders if stress is contributing to her abdominal pain.  States she has a lot a stress related to family illnesses.  She recently went on vacation and noticed she did not  have any abdominal pain.  She has been losing weight, but this has been intentional.  Ibuprofen or Advil fairly rarely.   Uncle had stomach cancer.   Past Medical History:  Diagnosis Date   Anxiety    Chronic cough 05/20/2013   Panic attacks    Pneumonia    Thyroid nodule 2013   Benign biopsy- ENT Dr. Constance Holster    Past Surgical History:  Procedure Laterality Date   CESAREAN SECTION  1992   COLONOSCOPY WITH PROPOFOL N/A 11/05/2014   One 5 mm sessile polyp in base of cecum. otherwise normal. fecal material on path. Surveillance in 5 years.    HYSTEROSCOPY WITH D & C N/A 10/03/2013   Procedure: DILATATION AND CURETTAGE /HYSTEROSCOPY;  Surgeon: [name], MD;  Location: [facility];  Service: Gynecology;  Laterality: N/A;   HYSTEROSCOPY WITH D & C N/A 11/05/2019   Procedure: DILATATION AND CURETTAGE HYSTEROSCOPY;  Surgeon: [name], MD;  Location: [facility];  Service: Gynecology;  Laterality: N/A;   POLYPECTOMY N/A 10/03/2013   Procedure: REMOVAL OF ENDOMETRIAL POLYP;  Surgeon: [name], MD;  Location: [facility];  Service: Gynecology;  Laterality: N/A;   POLYPECTOMY N/A 11/05/2014   Procedure: POLYPECTOMY;  Surgeon: Daneil Dolin, MD;  Location: [facility];  Service: Endoscopy;  Laterality: N/A;   uterus polyp     VULVA /PERINEUM BIOPSY Right 11/05/2019   Procedure: VULVAR BIOPSY;  Surgeon: Glo Herring,  Angelyn Punt, MD;  Location: [facility];  Service: Gynecology;  Laterality: Right;    Current Outpatient Medications  Medication Sig Dispense Refill   albuterol (VENTOLIN HFA) 108 (90 Base) MCG/ACT inhaler Inhale 2 puffs into the lungs every 6 (six) hours as needed for wheezing or shortness of breath. 8 g 0   Cholecalciferol (VITAMIN D) 2000 units CAPS Take by mouth.     clonazePAM (KLONOPIN) 1 MG tablet TAKE 1 TABLET BY MOUTH 3 TIMES A DAY AS NEEDED FOR ANXIETY. 90 tablet 1   furosemide (LASIX) 20 MG tablet TAKE (1) TABLET BY MOUTH ONCE daily prn 30 tablet 1   ibuprofen (ADVIL) 600 MG tablet Take 1  tablet (600 mg total) by mouth every 8 (eight) hours as needed. 30 tablet 0   ondansetron (ZOFRAN) 4 MG tablet Take 1 tablet (4 mg total) by mouth every 6 (six) hours. 12 tablet 0   pantoprazole (PROTONIX) 40 MG tablet Take 1 tablet (40 mg total) by mouth daily. 30 tablet 3   sertraline (ZOLOFT) 100 MG tablet Take 1.5 tablets (150 mg total) by mouth daily. 135 tablet 1   vitamin E 400 UNIT capsule Take 400 Units by mouth daily.     zolpidem (AMBIEN) 10 MG tablet Take 1 tablet (10 mg total) by mouth at bedtime as needed. for insomnia 90 tablet 0   No current facility-administered medications for this visit.    Allergies as of 02/14/2021   (No Known Allergies)    Family History  Problem Relation Age of Onset   Diabetes Mother    Alzheimer's disease Mother    Cancer Father        lung    Cancer Paternal Grandfather    Hodgkin's lymphoma Son    Colon cancer Neg Hx    Colon polyps Neg Hx     Social History   Socioeconomic History   Marital status: Married    Spouse name: Not on file   Number of children: 2   Years of education: Not on file   Highest education level: Not on file  Occupational History   Not on file  Tobacco Use   Smoking status: Never   Smokeless tobacco: Never  Vaping Use   Vaping Use: Never used  Substance and Sexual Activity   Alcohol use: No   Drug use: No   Sexual activity: Not Currently    Birth control/protection: Post-menopausal  Other Topics Concern   Not on file  Social History Narrative   Not on file   Social Determinants of Health   Financial Resource Strain: Not on file  Food Insecurity: Not on file  Transportation Needs: Not on file  Physical Activity: Not on file  Stress: Not on file  Social Connections: Not on file    Review of Systems: Gen: Denies fever, chills, cold or flulike symptoms, presyncope, syncope. CV: Denies chest pain, palpitations. Resp: Denies dyspnea or cough. GI: See HPI Heme: See HPI  Physical Exam: BP  131/82   Pulse 76   Temp (!) 97.3 F (36.3 C) (Temporal)   Ht 5\' 3"  (1.6 m)   Wt 262 lb 12.8 oz (119.2 kg)   LMP 05/25/2014   BMI 46.55 kg/m  General:   Alert and oriented. No distress noted. Pleasant and cooperative.  Head:  Normocephalic and atraumatic. Eyes:  Conjuctiva clear without scleral icterus. Heart:  S1, S2 present without murmurs appreciated. Lungs:  Clear to auscultation bilaterally. No wheezes, rales, or rhonchi. No distress.  Abdomen:  +BS, soft, and non-distended. Mild TTP in the epigastric area and LUQ. Minimal TTP in LLQ. No rebound or guarding. No HSM or masses noted. Msk:  Symmetrical without gross deformities. Normal posture. Extremities:  Without edema. Neurologic:  Alert and  oriented x4 Psych:  Normal mood and affect.    Assessment:  61 year old female with GI history of colon polyps, overdue for surveillance, presenting today for chief complaint of epigastric and LUQ abdominal burning as well as change in bowel habits x2 months.  She reports intermittent epigastric and LUQ burning that can be present upon waking or postprandial, occasional pain prior to a bowel movement, but not improved with a bowel movement.  Notably, symptoms are improved with Pepcid which she is taking sparingly.  Also with change in bowel movements from regular BMs daily to skipping days between bowel movements, and stool consistency is now mushy.  No associated nausea, vomiting, GERD symptoms, BRBPR, melena, or unintentional weight loss.  Rare NSAID use.  No prior EGD.  Last colonoscopy in 2016 with 5 mm sessile polyp, currently overdue for surveillance.  Family history significant for uncle with stomach cancer.  Per chart review, she had a CT in November 2021 with findings consistent with mild inflammatory or infectious colitis involving the splenic flexure region of the colon. Symptoms improved with antibiotics. Gallbladder in situ.  On exam, she has Mild TTP in the epigastric area and LUQ.  Minimal TTP in LLQ.  Differentials for upper abdominal pain include gastritis, PUD, less likely malignancy, doubt pancreatitis.  Less likely that her change in bowel habits is related to her upper abdominal pain though prior inflammatory findings on CT were at the splenic flexure.  She could have a component of irritable bowel syndrome in the setting of increased stress recently, but she needs colonoscopy for further evaluation of change in bowel habits, surveillance purposes, and follow-up on abnormal CT last year.  I have also recommended EGD for further evaluation of upper abdominal pain.  We will update labs at this time.   Plan:  CBC, CMP, lipase. EGD + TCS with propofol with Dr. Jena Gauss in the near future. The risks, benefits, and alternatives have been discussed with the patient in detail. The patient states understanding and desires to proceed. ASA 3 Start Protonix 40 mg daily. Start Benefiber 2 teaspoons daily x2 weeks then increase to twice daily. If Benefiber alone does not allow for bowel movements more routinely, she can try MiraLAX 1/2-1 capful daily in 8 ounces of water. Follow-up after procedures.  Advised to call with questions or concerns prior to her next visit.   Ermalinda Memos, PA-C [facility] 02/14/2021

## 2021-02-14 ENCOUNTER — Ambulatory Visit: Payer: 59 | Admitting: Gastroenterology

## 2021-02-14 ENCOUNTER — Encounter: Payer: Self-pay | Admitting: Gastroenterology

## 2021-02-14 ENCOUNTER — Other Ambulatory Visit: Payer: Self-pay

## 2021-02-14 VITALS — BP 131/82 | HR 76 | Temp 97.3°F | Ht 63.0 in | Wt 262.8 lb

## 2021-02-14 DIAGNOSIS — Z86010 Personal history of colonic polyps: Secondary | ICD-10-CM

## 2021-02-14 DIAGNOSIS — R194 Change in bowel habit: Secondary | ICD-10-CM | POA: Diagnosis not present

## 2021-02-14 DIAGNOSIS — R1010 Upper abdominal pain, unspecified: Secondary | ICD-10-CM | POA: Diagnosis not present

## 2021-02-14 MED ORDER — PANTOPRAZOLE SODIUM 40 MG PO TBEC: 40.0000 mg | DELAYED_RELEASE_TABLET | Freq: Every day | ORAL | 3 refills | Status: DC

## 2021-02-14 NOTE — Patient Instructions (Signed)
Please have blood work completed at [facility].  We will arrange for you to have an upper endoscopy and colonoscopy in the near future with Dr. Jena Gauss.  Start Protonix 40 mg daily 30 minutes before breakfast.  I have sent a prescription to your pharmacy.  Start Benefiber 2 teaspoons daily x2 weeks then increase to twice daily.  If Benefiber alone does not help with bowel regularity, you may try adding MiraLAX 1/2-1 capful daily in 8 ounces of water.  We will plan to see you back in the office after your procedures.  Do not hesitate to call if you have any questions or concerns prior to next visit.   It was great to meet you today!  I hope you get to feeling better soon!  Have a very Merry Christmas!  Ermalinda Memos, PA-C [facility]

## 2021-02-15 ENCOUNTER — Encounter: Payer: Self-pay | Admitting: Gastroenterology

## 2021-02-22 LAB — COMPLETE METABOLIC PANEL WITH GFR
AG Ratio: 1.2 (calc) (ref 1.0–2.5)
ALT: 24 U/L (ref 6–29)
AST: 22 U/L (ref 10–35)
Albumin: 4.1 g/dL (ref 3.6–5.1)
Alkaline phosphatase (APISO): 28 U/L — ABNORMAL LOW (ref 37–153)
BUN: 18 mg/dL (ref 7–25)
CO2: 29 mmol/L (ref 20–32)
Calcium: 9.3 mg/dL (ref 8.6–10.4)
Chloride: 101 mmol/L (ref 98–110)
Creat: 0.78 mg/dL (ref 0.50–1.03)
Globulin: 3.4 g/dL (calc) (ref 1.9–3.7)
Glucose, Bld: 99 mg/dL (ref 65–139)
Potassium: 4.1 mmol/L (ref 3.5–5.3)
Sodium: 138 mmol/L (ref 135–146)
Total Bilirubin: 0.8 mg/dL (ref 0.2–1.2)
Total Protein: 7.5 g/dL (ref 6.1–8.1)
eGFR: 89 mL/min/{1.73_m2} (ref >=60)

## 2021-02-22 LAB — CBC WITH DIFFERENTIAL/PLATELET
Absolute Monocytes: 512 cells/uL (ref 200–950)
Basophils Absolute: 28 cells/uL (ref 0–200)
Basophils Relative: 0.5 %
Eosinophils Absolute: 242 cells/uL (ref 15–500)
Eosinophils Relative: 4.4 %
HCT: 41.6 % (ref 35.0–45.0)
Hemoglobin: 13.8 g/dL (ref 11.7–15.5)
Lymphs Abs: 2261 cells/uL (ref 850–3900)
MCH: 29.6 pg (ref 27.0–33.0)
MCHC: 33.2 g/dL (ref 32.0–36.0)
MCV: 89.1 fL (ref 80.0–100.0)
MPV: 10.7 fL (ref 7.5–12.5)
Monocytes Relative: 9.3 %
Neutro Abs: 2459 cells/uL (ref 1500–7800)
Neutrophils Relative %: 44.7 %
Platelets: 246 10*3/uL (ref 140–400)
RBC: 4.67 10*6/uL (ref 3.80–5.10)
RDW: 13.3 % (ref 11.0–15.0)
Total Lymphocyte: 41.1 %
WBC: 5.5 10*3/uL (ref 3.8–10.8)

## 2021-02-22 LAB — LIPASE: Lipase: 74 U/L — ABNORMAL HIGH (ref 7–60)

## 2021-02-28 ENCOUNTER — Telehealth: Payer: Self-pay | Admitting: Gastroenterology

## 2021-02-28 NOTE — Telephone Encounter (Signed)
Spoke to pt.  She informed me that she has a cough and is spitting up phlegm with blood in it.  It's been going on for 2 days now.  Denies any acid reflux.  She doesn't feel sick.  Is able to eat and drink.  Continued sharp pains in left side for a couple of months now.  Pain comes and goes.  Denies any constipation or diarrhea.  States bm's not normal.   Comes out in chunks.  Goes 2 to 3 days without one.  Benefiber has helped.  Last seen 02/14/2021 and waiting on our schedules to be released for EGD/TCS.  Pt concerned about the phlegm with blood in it.

## 2021-02-28 NOTE — Telephone Encounter (Signed)
She needs to call PCP about productive cough with bloody phlegm. Proceed to the ED if any severe symptoms, weakness, or feeling like she will pass out.   Regarding her bowel movements, she can try adding MiraLAX 17 g daily in 8 oz of water. Continue benefiber daily.

## 2021-02-28 NOTE — Telephone Encounter (Signed)
Informed pt to call PCP about productive cough with bloody phlegm. Advised pt to proceed to the ED if any severe symptoms, weakness, or feeling like she will pass out.    Pt informed regarding her bowel movements, she can try adding MiraLAX 17 g daily in 8 oz of water. She was advised to continue benefiber daily.  Pt voiced understanding.

## 2021-02-28 NOTE — Telephone Encounter (Signed)
PATIENT CALLED AND IS HAVING SOME ISSUES,  SHE STATED THAT SHE IS COUGHING AND THERE IS BLOOD.  DOES SHE NEED TO GO TO THE ER   ALSO HAVING ABDOMINAL PAIN

## 2021-03-03 ENCOUNTER — Telehealth: Payer: Self-pay | Admitting: *Deleted

## 2021-03-03 ENCOUNTER — Encounter: Payer: Self-pay | Admitting: *Deleted

## 2021-03-03 MED ORDER — PEG 3350-KCL-NA BICARB-NACL 420 G PO SOLR: ORAL | 0 refills | Status: DC

## 2021-03-03 NOTE — Telephone Encounter (Signed)
Called pt. Scheduled for TCS/EGD with Dr. Jena Gauss, ASA 3 on 1/5. Aware will mail prep instructions/pre-op appt. Advised will send rx to pharmacy.

## 2021-03-17 NOTE — Patient Instructions (Addendum)
Jenna Love  03/17/2021     @PREFPERIOPPHARMACY @   Your procedure is scheduled on  03/24/2021.   Report to 05/22/2021 at  1045 A.M.   Call this number if you have problems the morning of surgery:  [phone]   Remember:  Follow the diet and prep instructions given to you by the office.    Take these medicines the morning of surgery with A SIP OF WATER                      clonazepam, protonix, zoloft.     Do not wear jewelry, make-up or nail polish.  Do not wear lotions, powders, or perfumes, or deodorant.  Do not shave 48 hours prior to surgery.  Men may shave face and neck.  Do not bring valuables to the hospital.  [facility] is not responsible for any belongings or valuables.  Contacts, dentures or bridgework may not be worn into surgery.  Leave your suitcase in the car.  After surgery it may be brought to your room.  For patients admitted to the hospital, discharge time will be determined by your treatment team.  Patients discharged the day of surgery will not be allowed to drive home and must have someone with them for 24 hours.    Special instructions:   DO NOT smoke tobacco or vape for 24 hours before your procedure.  Please read over the following fact sheets that you were given. Anesthesia Post-op Instructions and Care and Recovery After Surgery       Upper Endoscopy, Adult, Care After This sheet gives you information about how to care for yourself after your procedure. Your health care provider may also give you more specific instructions. If you have problems or questions, contact your health care provider. What can I expect after the procedure? After the procedure, it is common to have: A sore throat. Mild stomach pain or discomfort. Bloating. Nausea. Follow these instructions at home:  Follow instructions from your health care provider about what to eat or drink after your procedure. Return to your normal activities as told  by your health care provider. Ask your health care provider what activities are safe for you. Take over-the-counter and prescription medicines only as told by your health care provider. If you were given a sedative during the procedure, it can affect you for several hours. Do not drive or operate machinery until your health care provider says that it is safe. Keep all follow-up visits as told by your health care provider. This is important. Contact a health care provider if you have: A sore throat that lasts longer than one day. Trouble swallowing. Get help right away if: You vomit blood or your vomit looks like coffee grounds. You have: A fever. Bloody, black, or tarry stools. A severe sore throat or you cannot swallow. Difficulty breathing. Severe pain in your chest or abdomen. Summary After the procedure, it is common to have a sore throat, mild stomach discomfort, bloating, and nausea. If you were given a sedative during the procedure, it can affect you for several hours. Do not drive or operate machinery until your health care provider says that it is safe. Follow instructions from your health care provider about what to eat or drink after your procedure. Return to your normal activities as told by your health care provider. This information is not intended to replace advice given to you by your health care  provider. Make sure you discuss any questions you have with your health care provider. Document Revised: 01/10/2019 Document Reviewed: 08/06/2017 Elsevier Patient Education  2022 [employer]. Colonoscopy, Adult, Care After This sheet gives you information about how to care for yourself after your procedure. Your health care provider may also give you more specific instructions. If you have problems or questions, contact your health care provider. What can I expect after the procedure? After the procedure, it is common to have: A small amount of blood in your stool for 24 hours  after the procedure. Some gas. Mild cramping or bloating of your abdomen. Follow these instructions at home: Eating and drinking  Drink enough fluid to keep your urine pale yellow. Follow instructions from your health care provider about eating or drinking restrictions. Resume your normal diet as instructed by your health care provider. Avoid heavy or fried foods that are hard to digest. Activity Rest as told by your health care provider. Avoid sitting for a long time without moving. Get up to take short walks every 1-2 hours. This is important to improve blood flow and breathing. Ask for help if you feel weak or unsteady. Return to your normal activities as told by your health care provider. Ask your health care provider what activities are safe for you. Managing cramping and bloating  Try walking around when you have cramps or feel bloated. Apply heat to your abdomen as told by your health care provider. Use the heat source that your health care provider recommends, such as a moist heat pack or a heating pad. Place a towel between your skin and the heat source. Leave the heat on for 20-30 minutes. Remove the heat if your skin turns bright red. This is especially important if you are unable to feel pain, heat, or cold. You may have a greater risk of getting burned. General instructions If you were given a sedative during the procedure, it can affect you for several hours. Do not drive or operate machinery until your health care provider says that it is safe. For the first 24 hours after the procedure: Do not sign important documents. Do not drink alcohol. Do your regular daily activities at a slower pace than normal. Eat soft foods that are easy to digest. Take over-the-counter and prescription medicines only as told by your health care provider. Keep all follow-up visits as told by your health care provider. This is important. Contact a health care provider if: You have blood in your  stool 2-3 days after the procedure. Get help right away if you have: More than a small spotting of blood in your stool. Large blood clots in your stool. Swelling of your abdomen. Nausea or vomiting. A fever. Increasing pain in your abdomen that is not relieved with medicine. Summary After the procedure, it is common to have a small amount of blood in your stool. You may also have mild cramping and bloating of your abdomen. If you were given a sedative during the procedure, it can affect you for several hours. Do not drive or operate machinery until your health care provider says that it is safe. Get help right away if you have a lot of blood in your stool, nausea or vomiting, a fever, or increased pain in your abdomen. This information is not intended to replace advice given to you by your health care provider. Make sure you discuss any questions you have with your health care provider. Document Revised: 01/10/2019 Document Reviewed: 09/30/2018 Elsevier Patient  Education  2022 Else[employer] Inc. Monitored Anesthesia Care, Care After This sheet gives you information about how to care for yourself after your procedure. Your health care provider may also give you more specific instructions. If you have problems or questions, contact your health care provider. What can I expect after the procedure? After the procedure, it is common to have: Tiredness. Forgetfulness about what happened after the procedure. Impaired judgment for important decisions. Nausea or vomiting. Some difficulty with balance. Follow these instructions at home: For the time period you were told by your health care provider:   Rest as needed. Do not participate in activities where you could fall or become injured. Do not drive or use machinery. Do not drink alcohol. Do not take sleeping pills or medicines that cause drowsiness. Do not make important decisions or sign legal documents. Do not take care of children on your  own. Eating and drinking Follow the diet that is recommended by your health care provider. Drink enough fluid to keep your urine pale yellow. If you vomit: Drink water, juice, or soup when you can drink without vomiting. Make sure you have little or no nausea before eating solid foods. General instructions Have a responsible adult stay with you for the time you are told. It is important to have someone help care for you until you are awake and alert. Take over-the-counter and prescription medicines only as told by your health care provider. If you have sleep apnea, surgery and certain medicines can increase your risk for breathing problems. Follow instructions from your health care provider about wearing your sleep device: Anytime you are sleeping, including during daytime naps. While taking prescription pain medicines, sleeping medicines, or medicines that make you drowsy. Avoid smoking. Keep all follow-up visits as told by your health care provider. This is important. Contact a health care provider if: You keep feeling nauseous or you keep vomiting. You feel light-headed. You are still sleepy or having trouble with balance after 24 hours. You develop a rash. You have a fever. You have redness or swelling around the IV site. Get help right away if: You have trouble breathing. You have new-onset confusion at home. Summary For several hours after your procedure, you may feel tired. You may also be forgetful and have poor judgment. Have a responsible adult stay with you for the time you are told. It is important to have someone help care for you until you are awake and alert. Rest as told. Do not drive or operate machinery. Do not drink alcohol or take sleeping pills. Get help right away if you have trouble breathing, or if you suddenly become confused. This information is not intended to replace advice given to you by your health care provider. Make sure you discuss any questions you  have with your health care provider. Document Revised: 11/20/2019 Document Reviewed: 02/06/2019 Elsevier Patient Education  2022 [employer].

## 2021-03-22 ENCOUNTER — Encounter (HOSPITAL_COMMUNITY)
Admission: RE | Admit: 2021-03-22 | Discharge: 2021-03-22 | Disposition: A | Discharge status: Home / Self Care | Payer: 59 | Source: Ambulatory Visit | Attending: Internal Medicine | Admitting: Internal Medicine

## 2021-03-22 ENCOUNTER — Telehealth: Payer: Self-pay | Admitting: *Deleted

## 2021-03-22 NOTE — Telephone Encounter (Signed)
PA approved via [facility] website. Auth# [id number], DOS Mar 24, 2021 - Jun 22, 2021

## 2021-03-24 ENCOUNTER — Encounter (HOSPITAL_COMMUNITY)
Admission: RE | Disposition: A | Discharge status: Home / Self Care | Payer: Self-pay | Source: Ambulatory Visit | Attending: Internal Medicine

## 2021-03-24 ENCOUNTER — Ambulatory Visit (HOSPITAL_COMMUNITY): Payer: 59 | Admitting: Anesthesiology

## 2021-03-24 ENCOUNTER — Ambulatory Visit (HOSPITAL_COMMUNITY)
Admission: RE | Admit: 2021-03-24 | Discharge: 2021-03-24 | Disposition: A | Discharge status: Home / Self Care | Payer: 59 | Source: Ambulatory Visit | Attending: Internal Medicine | Admitting: Internal Medicine

## 2021-03-24 ENCOUNTER — Other Ambulatory Visit: Payer: Self-pay

## 2021-03-24 ENCOUNTER — Telehealth: Payer: Self-pay

## 2021-03-24 ENCOUNTER — Encounter: Payer: Self-pay | Admitting: Internal Medicine

## 2021-03-24 ENCOUNTER — Encounter (HOSPITAL_COMMUNITY): Payer: Self-pay | Admitting: Internal Medicine

## 2021-03-24 DIAGNOSIS — K269 Duodenal ulcer, unspecified as acute or chronic, without hemorrhage or perforation: Secondary | ICD-10-CM | POA: Insufficient documentation

## 2021-03-24 DIAGNOSIS — K317 Polyp of stomach and duodenum: Secondary | ICD-10-CM | POA: Diagnosis not present

## 2021-03-24 DIAGNOSIS — Z86010 Personal history of colonic polyps: Secondary | ICD-10-CM | POA: Diagnosis not present

## 2021-03-24 DIAGNOSIS — K5730 Diverticulosis of large intestine without perforation or abscess without bleeding: Secondary | ICD-10-CM | POA: Diagnosis not present

## 2021-03-24 DIAGNOSIS — K259 Gastric ulcer, unspecified as acute or chronic, without hemorrhage or perforation: Secondary | ICD-10-CM | POA: Insufficient documentation

## 2021-03-24 DIAGNOSIS — R1012 Left upper quadrant pain: Secondary | ICD-10-CM | POA: Diagnosis not present

## 2021-03-24 DIAGNOSIS — Z538 Procedure and treatment not carried out for other reasons: Secondary | ICD-10-CM | POA: Diagnosis not present

## 2021-03-24 HISTORY — PX: ESOPHAGOGASTRODUODENOSCOPY (EGD) WITH PROPOFOL: SHX5813

## 2021-03-24 HISTORY — PX: BIOPSY: SHX5522

## 2021-03-24 HISTORY — PX: COLONOSCOPY WITH PROPOFOL: SHX5780

## 2021-03-24 SURGERY — COLONOSCOPY WITH PROPOFOL
Anesthesia: General

## 2021-03-24 MED ORDER — PROPOFOL 10 MG/ML IV BOLUS
INTRAVENOUS | Status: DC | PRN
Start: 2021-03-24 — End: 2021-03-24
  Administered 2021-03-24: 50 mg via INTRAVENOUS
  Administered 2021-03-24: 100 mg via INTRAVENOUS

## 2021-03-24 MED ORDER — LIDOCAINE HCL (CARDIAC) PF 100 MG/5ML IV SOSY
PREFILLED_SYRINGE | INTRAVENOUS | Status: DC | PRN
  Administered 2021-03-24: 50 mg via INTRAVENOUS

## 2021-03-24 MED ORDER — PROPOFOL 500 MG/50ML IV EMUL
INTRAVENOUS | Status: DC | PRN
  Administered 2021-03-24: 150 ug/kg/min via INTRAVENOUS

## 2021-03-24 MED ORDER — PHENYLEPHRINE 40 MCG/ML (10ML) SYRINGE FOR IV PUSH (FOR BLOOD PRESSURE SUPPORT)
PREFILLED_SYRINGE | INTRAVENOUS | Status: DC | PRN
  Administered 2021-03-24: 80 ug via INTRAVENOUS

## 2021-03-24 MED ORDER — LACTATED RINGERS IV SOLN: INTRAVENOUS | Status: DC | PRN

## 2021-03-24 MED ORDER — PHENYLEPHRINE 40 MCG/ML (10ML) SYRINGE FOR IV PUSH (FOR BLOOD PRESSURE SUPPORT)
PREFILLED_SYRINGE | INTRAVENOUS | Status: AC
  Filled 2021-03-24: qty 10

## 2021-03-24 NOTE — Discharge Instructions (Addendum)
EGD Discharge instructions Please read the instructions outlined below and refer to this sheet in the next few weeks. These discharge instructions provide you with general information on caring for yourself after you leave the hospital. Your doctor may also give you specific instructions. While your treatment has been planned according to the most current medical practices available, unavoidable complications occasionally occur. If you have any problems or questions after discharge, please call your doctor. ACTIVITY You may resume your regular activity but move at a slower pace for the next 24 hours.  Take frequent rest periods for the next 24 hours.  Walking will help expel (get rid of) the air and reduce the bloated feeling in your abdomen.  No driving for 24 hours (because of the anesthesia (medicine) used during the test).  You may shower.  Do not sign any important legal documents or operate any machinery for 24 hours (because of the anesthesia used during the test).  NUTRITION Drink plenty of fluids.  You may resume your normal diet.  Begin with a light meal and progress to your normal diet.  Avoid alcoholic beverages for 24 hours or as instructed by your caregiver.  MEDICATIONS You may resume your normal medications unless your caregiver tells you otherwise.  WHAT YOU CAN EXPECT TODAY You may experience abdominal discomfort such as a feeling of fullness or gas pains.  FOLLOW-UP Your doctor will discuss the results of your test with you.  SEEK IMMEDIATE MEDICAL ATTENTION IF ANY OF THE FOLLOWING OCCUR: Excessive nausea (feeling sick to your stomach) and/or vomiting.  Severe abdominal pain and distention (swelling).  Trouble swallowing.  Temperature over 101 F (37.8 C).  Rectal bleeding or vomiting of blood.     Colonoscopy Discharge Instructions  Read the instructions outlined below and refer to this sheet in the next few weeks. These discharge instructions provide you with  general information on caring for yourself after you leave the hospital. Your doctor may also give you specific instructions. While your treatment has been planned according to the most current medical practices available, unavoidable complications occasionally occur. If you have any problems or questions after discharge, call Dr. Jena Gauss at [phone]. ACTIVITY You may resume your regular activity, but move at a slower pace for the next 24 hours.  Take frequent rest periods for the next 24 hours.  Walking will help get rid of the air and reduce the bloated feeling in your belly (abdomen).  No driving for 24 hours (because of the medicine (anesthesia) used during the test).   Do not sign any important legal documents or operate any machinery for 24 hours (because of the anesthesia used during the test).  NUTRITION Drink plenty of fluids.  You may resume your normal diet as instructed by your doctor.  Begin with a light meal and progress to your normal diet. Heavy or fried foods are harder to digest and may make you feel sick to your stomach (nauseated).  Avoid alcoholic beverages for 24 hours or as instructed.  MEDICATIONS You may resume your normal medications unless your doctor tells you otherwise.  WHAT YOU CAN EXPECT TODAY Some feelings of bloating in the abdomen.  Passage of more gas than usual.  Spotting of blood in your stool or on the toilet paper.  IF YOU HAD POLYPS REMOVED DURING THE COLONOSCOPY: No aspirin products for 7 days or as instructed.  No alcohol for 7 days or as instructed.  Eat a soft diet for the next 24 hours.  FINDING OUT THE RESULTS OF YOUR TEST Not all test results are available during your visit. If your test results are not back during the visit, make an appointment with your caregiver to find out the results. Do not assume everything is normal if you have not heard from your caregiver or the medical facility. It is important for you to follow up on all of your test  results.  SEEK IMMEDIATE MEDICAL ATTENTION IF: You have more than a spotting of blood in your stool.  Your belly is swollen (abdominal distention).  You are nauseated or vomiting.  You have a temperature over 101.  You have abdominal pain or discomfort that is severe or gets worse throughout the day.     Your colonoscopy could not be completed because you are not adequately cleaned out.  Your stomach was inflamed.  Biopsies were taken.  Small polyp removed.    Further recommendations to follow pending review of lab results  We will need to reschedule colonoscopy at a later date.  At patient request, I called Hilda Lias at [phone] -     Discussed findings and recommendations

## 2021-03-24 NOTE — Telephone Encounter (Signed)
Please schedule OV to reschedule TCS, had poor prep today.

## 2021-03-24 NOTE — Anesthesia Postprocedure Evaluation (Signed)
Anesthesia Post Note  Patient: Jenna Love  Procedure(s) Performed: COLONOSCOPY WITH PROPOFOL ESOPHAGOGASTRODUODENOSCOPY (EGD) WITH PROPOFOL BIOPSY  Patient location during evaluation: Phase II Anesthesia Type: General Level of consciousness: awake Pain management: pain level controlled Vital Signs Assessment: post-procedure vital signs reviewed and stable Respiratory status: spontaneous breathing and respiratory function stable Cardiovascular status: blood pressure returned to baseline and stable Postop Assessment: no headache and no apparent nausea or vomiting Anesthetic complications: no Comments: Late entry   No notable events documented.   Last Vitals:  Vitals:   03/24/21 1049 03/24/21 1208  BP: (!) 147/82 108/61  Pulse: 86 80  Resp: 16 (!) 22  Temp: 36.7 C 36.4 C  SpO2: 96% 97%    Last Pain:  Vitals:   03/24/21 1208  TempSrc: Axillary  PainSc: 0-No pain                 Windell Norfolk

## 2021-03-24 NOTE — Op Note (Signed)
[facility] Patient Name: Jenna GelinasCathey Justice-dunlap Procedure Date: 03/24/2021 11:52 AM MRN: [id number] Date of Birth: 04-04-1960 Attending MD: Gennette Pacobert Michael Lonie Rummell , MD CSN: [id number] Age: 61 Admit Type: Outpatient Procedure:                Colonoscopy Indications:              High risk colon cancer surveillance: Personal                            history of colonic polyps Providers:                Gennette Pacobert Michael Dion Sibal, MD, Sheran FavaJessica Boudreaux, Edythe ClarityKelly                            Cox, Technician Referring MD:              Medicines:                Propofol per Anesthesia Complications:            No immediate complications. Estimated Blood Loss:     Estimated blood loss: none. Procedure:                Pre-Anesthesia Assessment:                           - Prior to the procedure, a History and Physical                            was performed, and patient medications and                            allergies were reviewed. The patient's tolerance of                            previous anesthesia was also reviewed. The risks                            and benefits of the procedure and the sedation                            options and risks were discussed with the patient.                            All questions were answered, and informed consent                            was obtained. Prior Anticoagulants: The patient has                            taken no previous anticoagulant or antiplatelet                            agents. ASA Grade Assessment: III - A patient with  severe systemic disease. After reviewing the risks                            and benefits, the patient was deemed in                            satisfactory condition to undergo the procedure.                           After obtaining informed consent, the colonoscope                            was passed under direct vision. Throughout the                            procedure, the  patient's blood pressure, pulse, and                            oxygen saturations were monitored continuously. The                            [phone]) scope was introduced through the                            anus and advanced to the the cecum, identified by                            appendiceal orifice and ileocecal valve. The                            colonoscopy was performed without difficulty. The                            patient tolerated the procedure well. The quality                            of the bowel preparation was adequate. Scope In: 11:55:56 AM Scope Out: 12:02:46 PM Total Procedure Duration: 0 hours 6 minutes 50 seconds  Findings:      Scattered medium-mouthed diverticula were found in the sigmoid colon.       Large amount of viscous stool and vegetable material/stool throughout       the rectum and sigmoid colon. I was hopeful it would wash away and       initially did improve with lavage and suctioning. I did negotiate the       scope up into the transverse colon and ran into a large amount of       viscous stool and vegetable matter. Colon could not be seen completely.       Colonoscopy could not be completed. Procedure aborted. Impression:               - Diverticulosis.                           - Diverticulosis in the sigmoid colon.  Inadequate preparation. Procedure could not be                            completed Moderate Sedation:      Moderate (conscious) sedation was personally administered by an       anesthesia professional. The following parameters were monitored: oxygen       saturation, heart rate, blood pressure, and response to care. Recommendation:           - Patient has a contact number available for                            emergencies. The signs and symptoms of potential                            delayed complications were discussed with the                            patient. Return to normal  activities tomorrow.                            Written discharge instructions were provided to the                            patient.                           - Advance diet as tolerated. Reschedule procedure.                            See EGD report. Procedure Code(s):        --- Professional ---                           [phone], Colonoscopy, flexible; diagnostic, including                            collection of specimen(s) by brushing or washing,                            when performed (separate procedure) Diagnosis Code(s):        --- Professional ---                           Z86.010, Personal history of colonic polyps                           K57.30, Diverticulosis of large intestine without                            perforation or abscess without bleeding CPT copyright 2019 American Medical Association. All rights reserved. The codes documented in this report are preliminary and upon coder review may  be revised to meet current compliance requirements. Gerrit Friends. Bubba Vanbenschoten, MD Gennette Pac, MD 03/24/2021 12:41:43 PM This report has been signed electronically. Number of Addenda: 0

## 2021-03-24 NOTE — Transfer of Care (Signed)
Immediate Anesthesia Transfer of Care Note  Patient: Jenna Love  Procedure(s) Performed: COLONOSCOPY WITH PROPOFOL ESOPHAGOGASTRODUODENOSCOPY (EGD) WITH PROPOFOL BIOPSY  Patient Location: Short Stay  Anesthesia Type:General  Level of Consciousness: awake and oriented  Airway & Oxygen Therapy: Patient Spontanous Breathing  Post-op Assessment: Report given to RN and Post -op Vital signs reviewed and stable  Post vital signs: Reviewed and stable  Last Vitals:  Vitals Value Taken Time  BP    Temp    Pulse    Resp    SpO2      Last Pain:  Vitals:   03/24/21 1139  TempSrc:   PainSc: 3          Complications: No notable events documented.

## 2021-03-24 NOTE — Anesthesia Preprocedure Evaluation (Signed)
Anesthesia Evaluation  ?Patient identified by MRN, date of birth, ID band ?Patient awake ? ? ? ?Reviewed: ?Allergy & Precautions, H&P , NPO status , Patient's Chart, lab work & pertinent test results, reviewed documented beta blocker date and time  ? ?Airway ?Mallampati: II ? ?TM Distance: >3 FB ?Neck ROM: full ? ? ? Dental ?no notable dental hx. ? ?  ?Pulmonary ?neg pulmonary ROS,  ?  ?Pulmonary exam normal ?breath sounds clear to auscultation ? ? ? ? ? ? Cardiovascular ?Exercise Tolerance: Good ?negative cardio ROS ? ? ?Rhythm:regular Rate:Normal ? ? ?  ?Neuro/Psych ?PSYCHIATRIC DISORDERS Anxiety Depression negative neurological ROS ?   ? GI/Hepatic ?negative GI ROS, Neg liver ROS,   ?Endo/Other  ?negative endocrine ROS ? Renal/GU ?negative Renal ROS  ?negative genitourinary ?  ?Musculoskeletal ? ? Abdominal ?  ?Peds ? Hematology ?negative hematology ROS ?(+)   ?Anesthesia Other Findings ? ? Reproductive/Obstetrics ?negative OB ROS ? ?  ? ? ? ? ? ? ? ? ? ? ? ? ? ?  ?  ? ? ? ? ? ? ? ? ?Anesthesia Physical ?Anesthesia Plan ? ?ASA: 2 ? ?Anesthesia Plan: General  ? ?Post-op Pain Management:   ? ?Induction:  ? ?PONV Risk Score and Plan: Propofol infusion ? ?Airway Management Planned:  ? ?Additional Equipment:  ? ?Intra-op Plan:  ? ?Post-operative Plan:  ? ?Informed Consent: I have reviewed the patients History and Physical, chart, labs and discussed the procedure including the risks, benefits and alternatives for the proposed anesthesia with the patient or authorized representative who has indicated his/her understanding and acceptance.  ? ? ? ?Dental Advisory Given ? ?Plan Discussed with: CRNA ? ?Anesthesia Plan Comments:   ? ? ? ? ? ? ?Anesthesia Quick Evaluation ? ?

## 2021-03-24 NOTE — Anesthesia Procedure Notes (Signed)
Date/Time: 03/24/2021 11:44 AM Performed by: Julian Reil, CRNA Pre-anesthesia Checklist: Patient identified, Emergency Drugs available, Suction available and Patient being monitored Patient Re-evaluated:Patient Re-evaluated prior to induction Oxygen Delivery Method: Nasal cannula Induction Type: IV induction Placement Confirmation: positive ETCO2

## 2021-03-24 NOTE — H&P (Signed)
@LOGO @   Primary Care Physician:  Lindell Spar, MD Primary Gastroenterologist:  Dr.   Pre-Procedure History & Physical: HPI:  Jenna Love is a 61 y.o. female here for Further evaluation of left upper quadrant abdominal pain via EGD and surveillance colonoscopy given history of colonic adenoma removed previously.  Patient denies dysphagia.  Bowel function is improved on Benefiber.  No bleeding no melena.  Left upper quadrant abdominal pain improved somewhat at least temporally related to taking Protonix once daily.  Does not perceive she is ever had much in the way of reflux symptoms.  Minimally elevated serum lipase of uncertain significance.  Past Medical History:  Diagnosis Date   Anxiety    Chronic cough 05/20/2013   Panic attacks    Pneumonia    Thyroid nodule 2013   Benign biopsy- ENT Dr. Constance Holster    Past Surgical History:  Procedure Laterality Date   CESAREAN SECTION  1992   COLONOSCOPY WITH PROPOFOL N/A 11/05/2014   One 5 mm sessile polyp in base of cecum. otherwise normal. fecal material on path. Surveillance in 5 years.    HYSTEROSCOPY WITH D & C N/A 10/03/2013   Procedure: DILATATION AND CURETTAGE /HYSTEROSCOPY;  Surgeon: [name], MD;  Location: [facility];  Service: Gynecology;  Laterality: N/A;   HYSTEROSCOPY WITH D & C N/A 11/05/2019   Procedure: DILATATION AND CURETTAGE HYSTEROSCOPY;  Surgeon: [name], MD;  Location: [facility];  Service: Gynecology;  Laterality: N/A;   POLYPECTOMY N/A 10/03/2013   Procedure: REMOVAL OF ENDOMETRIAL POLYP;  Surgeon: [name], MD;  Location: [facility];  Service: Gynecology;  Laterality: N/A;   POLYPECTOMY N/A 11/05/2014   Procedure: POLYPECTOMY;  Surgeon: Daneil Dolin, MD;  Location: [facility];  Service: Endoscopy;  Laterality: N/A;   uterus polyp     VULVA /PERINEUM BIOPSY Right 11/05/2019   Procedure: VULVAR BIOPSY;  Surgeon: [name], MD;  Location: [facility];  Service: Gynecology;  Laterality: Right;     Prior to Admission medications   Medication Sig Start Date End Date Taking? Authorizing Provider  Cholecalciferol (VITAMIN D) 2000 units CAPS Take 2,000 Units by mouth daily.   Yes [provider]  clonazePAM (KLONOPIN) 1 MG tablet TAKE 1 TABLET BY MOUTH 3 TIMES A DAY AS NEEDED FOR ANXIETY. 08/26/20  Yes Lindell Spar, MD  pantoprazole (PROTONIX) 40 MG tablet Take 1 tablet (40 mg total) by mouth daily. 02/14/21  Yes Aliene Altes S, PA-C  polyethylene glycol-electrolytes (NULYTELY) 420 g solution As directed 03/03/21  Yes Thoms Barthelemy, Cristopher Estimable, MD  sertraline (ZOLOFT) 100 MG tablet Take 1.5 tablets (150 mg total) by mouth daily. 05/13/20  Yes Holcomb, Modena Nunnery, MD  vitamin E 1000 UNIT capsule Take 1,000 Units by mouth daily.   Yes [provider]  zolpidem (AMBIEN) 10 MG tablet Take 1 tablet (10 mg total) by mouth at bedtime as needed. for insomnia Patient taking differently: Take 10 mg by mouth at bedtime. for insomnia 10/25/20  Yes Lindell Spar, MD  albuterol (VENTOLIN HFA) 108 (90 Base) MCG/ACT inhaler Inhale 2 puffs into the lungs every 6 (six) hours as needed for wheezing or shortness of breath. Patient not taking: Reported on 03/15/2021 10/25/20   Lindell Spar, MD  ondansetron (ZOFRAN) 4 MG tablet Take 1 tablet (4 mg total) by mouth every 6 (six) hours. Patient not taking: Reported on 03/15/2021 10/23/20   Ward, Lenise Arena, PA-C    Allergies as of 03/03/2021   (  No Known Allergies)    Family History  Problem Relation Age of Onset   Diabetes Mother    Alzheimer's disease Mother    Cancer Father        lung    Cancer Paternal Grandfather    Hodgkin's lymphoma Son    Colon cancer Neg Hx    Colon polyps Neg Hx     Social History   Socioeconomic History   Marital status: Married    Spouse name: Not on file   Number of children: 2   Years of education: Not on file   Highest education level: Not on file  Occupational History   Not on file  Tobacco Use    Smoking status: Never   Smokeless tobacco: Never  Vaping Use   Vaping Use: Never used  Substance and Sexual Activity   Alcohol use: No   Drug use: No   Sexual activity: Not Currently    Birth control/protection: Post-menopausal  Other Topics Concern   Not on file  Social History Narrative   Not on file   Social Determinants of Health   Financial Resource Strain: Not on file  Food Insecurity: Not on file  Transportation Needs: Not on file  Physical Activity: Not on file  Stress: Not on file  Social Connections: Not on file  Intimate Partner Violence: Not on file    Review of Systems: See HPI, otherwise negative ROS  Physical Exam: BP (!) 147/82    Pulse 86    Temp 98 F (36.7 C) (Oral)    Resp 16    LMP 05/25/2014    SpO2 96%  General:   Alert,  Well-developed, well-nourished, pleasant and cooperative in NAD . Neck:  Supple; no masses or thyromegaly. No significant cervical adenopathy. Lungs:  Clear throughout to auscultation.   No wheezes, crackles, or rhonchi. No acute distress. Heart:  Regular rate and rhythm; no murmurs, clicks, rubs,  or gallops. Abdomen: Non-distended, normal bowel sounds.  Soft and nontender without appreciable mass or hepatosplenomegaly.  Pulses:  Normal pulses noted. Extremities:  Without clubbing or edema.  Impression/Plan:    61 year old lady here for further evaluation of left upper quadrant abdominal pain and surveillance colonoscopy, given history of colonic adenomas.  Denies dysphagia.  I have offered the patient a diagnostic EGD and surveillance colonoscopy per plan.  The risks, benefits, limitations, imponderables and alternatives regarding both EGD and colonoscopy have been reviewed with the patient. Questions have been answered. All parties agreeable.    The risks, benefits, limitations, alternatives and imponderables have been reviewed with the patient. Questions have been answered. All parties are agreeable.       Notice: This  dictation was prepared with Dragon dictation along with smaller phrase technology. Any transcriptional errors that result from this process are unintentional and may not be corrected upon review.

## 2021-03-24 NOTE — Telephone Encounter (Signed)
-----   Message from Corbin Ade, MD sent at 03/24/2021 12:37 PM EST ----- Upper done today.  Colon incomplete.  Poor prep.  We need to circle back and get her rescheduled

## 2021-03-28 LAB — SURGICAL PATHOLOGY

## 2021-04-01 ENCOUNTER — Encounter (HOSPITAL_COMMUNITY): Payer: Self-pay | Admitting: Internal Medicine

## 2021-04-01 NOTE — Op Note (Signed)
[facility] Patient Name: Jenna GelinasCathey Justice-dunlap Procedure Date: 03/24/2021 11:19 AM MRN: [id number] Date of Birth: [birth year] Attending MD: Gennette Pacobert Michael Gurvir Schrom , MD CSN: [id number] Age: [age] Admit Type: Outpatient Procedure:                Upper GI endoscopy Indications:              Abdominal pain in the left upper quadrant Providers:                Gennette Pacobert Michael Rondey Fallen, MD, Sheran FavaJessica Boudreaux, Edythe ClarityKelly                            Cox, Technician Referring MD:              Medicines:                Propofol per Anesthesia Complications:            No immediate complications. Estimated Blood Loss:     Estimated blood loss was minimal. Procedure:                Pre-Anesthesia Assessment:                           - Prior to the procedure, a History and Physical                            was performed, and patient medications and                            allergies were reviewed. The patient's tolerance of                            previous anesthesia was also reviewed. The risks                            and benefits of the procedure and the sedation                            options and risks were discussed with the patient.                            All questions were answered, and informed consent                            was obtained. Prior Anticoagulants: The patient has                            taken no previous anticoagulant or antiplatelet                            agents. ASA Grade Assessment: III - A patient with                            severe systemic disease. After reviewing the risks  and benefits, the patient was deemed in                            satisfactory condition to undergo the procedure.                           After obtaining informed consent, the endoscope was                            passed under direct vision. Throughout the                            procedure, the patient's blood pressure, pulse, and                             oxygen saturations were monitored continuously. The                            GIF-H190 ([id number]) scope was introduced through the                            mouth, and advanced to the second part of duodenum.                            The upper GI endoscopy was accomplished without                            difficulty. The patient tolerated the procedure                            well. Scope In: 11:42:58 AM Scope Out: 11:48:44 AM Total Procedure Duration: 0 hours 5 minutes 46 seconds  Findings:      The examined esophagus was normal (accentuated undulating Z-line).       Gastric erosions and nodularity. Noted. No obvious infiltrating process.      Antral erosions extending into the duodenal bulb. No ulcer. Biopsies of       the gastric mucosa taken. Multiple 2 to 5 mm hyperplastic appearing       polyps in the body of the stomach. 1 biopsy/removed. Impression:               - Normal esophagus.                           -Gastric erosions and nodularity. Duodenal                            erosions. Gastric polyps?biopsied Moderate Sedation:      Moderate (conscious) sedation was personally administered by an       anesthesia professional. The following parameters were monitored: oxygen       saturation, heart rate, blood pressure, respiratory rate, EKG, adequacy       of pulmonary ventilation, and response to care. Recommendation:           - Patient has a contact number available for  emergencies. The signs and symptoms of potential                            delayed complications were discussed with the                            patient. Return to normal activities tomorrow.                            Written discharge instructions were provided to the                            patient.                           - Advance diet as tolerated.                           - Continue present medications.                           - Return to my  office (date not yet determined).                            See colonoscopy report. Procedure Code(s):        --- Professional ---                           [phone], Esophagogastroduodenoscopy, flexible,                            transoral; diagnostic, including collection of                            specimen(s) by brushing or washing, when performed                            (separate procedure) Diagnosis Code(s):        --- Professional ---                           R10.12, Left upper quadrant pain CPT copyright 2019 American Medical Association. All rights reserved. The codes documented in this report are preliminary and upon coder review may  be revised to meet current compliance requirements. Gerrit Friends. Alyna Stensland, MD Gennette Pac, MD 04/01/2021 9:04:20 AM This report has been signed electronically. Number of Addenda: 0

## 2021-04-04 ENCOUNTER — Encounter: Payer: Self-pay | Admitting: Internal Medicine

## 2021-05-17 ENCOUNTER — Telehealth: Payer: 59 | Admitting: Gastroenterology

## 2021-05-17 DIAGNOSIS — Z8719 Personal history of other diseases of the digestive system: Secondary | ICD-10-CM

## 2021-05-17 DIAGNOSIS — M545 Low back pain, unspecified: Secondary | ICD-10-CM

## 2021-05-17 DIAGNOSIS — R748 Abnormal levels of other serum enzymes: Secondary | ICD-10-CM

## 2021-05-17 DIAGNOSIS — R1012 Left upper quadrant pain: Secondary | ICD-10-CM

## 2021-05-17 DIAGNOSIS — R1013 Epigastric pain: Secondary | ICD-10-CM

## 2021-05-17 NOTE — Telephone Encounter (Signed)
PATIENT CALLED AND ASKED IF WE COULD SEND IN AN ORDER FOR AN ABDOMINAL X RAY.  SHE WAS UNABLE TO COMPLETE HER COLONOSCOPY DUE TO POOR PREP AND SHE SAID SHE IS STILL HAVING ABDOMINAL AND BACK PAIN.

## 2021-05-18 ENCOUNTER — Other Ambulatory Visit: Payer: Self-pay | Admitting: Gastroenterology

## 2021-05-18 DIAGNOSIS — Z8719 Personal history of other diseases of the digestive system: Secondary | ICD-10-CM

## 2021-05-18 DIAGNOSIS — M545 Low back pain, unspecified: Secondary | ICD-10-CM

## 2021-05-18 DIAGNOSIS — R748 Abnormal levels of other serum enzymes: Secondary | ICD-10-CM

## 2021-05-18 DIAGNOSIS — R1013 Epigastric pain: Secondary | ICD-10-CM

## 2021-05-18 DIAGNOSIS — R1012 Left upper quadrant pain: Secondary | ICD-10-CM

## 2021-05-18 NOTE — Telephone Encounter (Signed)
Spoke with patient.  She continues plantar epigastric and left upper quadrant abdominal pain and is also having left lower back pain.  She reports her symptoms are similar to when she saw me in the office, but her pain has worsened and has been constant for the last 2 days.  Currently 7 out of 10 in severity.  She is taking Protonix daily as prescribed, usually no NSAIDs but has been taking ibuprofen for the last 2 nights for her pain.  No associated nausea or vomiting.  Her bowels are moving every 2 days with Benefiber.  Denies fever, rectal bleeding, or melena.  Reviewed EGD from January.  She was found to have gastric erosions and nodularity as well as duodenal erosions.  Biopsies gastritis, negative for H. pylori.  Colonoscopy in January incomplete due to poor prep though she did have diverticulosis.  Prior blood work in December for similar symptoms revealed mildly elevated lipase. ? ?Recommend updating CBC, CMP, lipase as well as obtaining CT A/P with contrast to evaluate further, specifically for pancreatitis and diverticulitis. Further recommendations to follow.  ? ?Lab orders have been placed. ? ?[facility] clinical pool:  ?Please arrange CT A/P with contrast ASAP. ?DX: Epigastric pain, LUQ pain, left lower back pain, history of elevated lipase, history of diverticulosis.  ?

## 2021-05-18 NOTE — Telephone Encounter (Signed)
Spoke to pt, she informed me that she is having shooting pain going through her back. She states she could not complete the colonoscopy, but would like to have an X-Ray because of the pain. She states not having any other symptoms.    ?

## 2021-05-18 NOTE — Addendum Note (Signed)
Addended by: Armstead Peaks on: 05/18/2021 10:23 AM ? ? Modules accepted: Orders ? ?

## 2021-05-18 NOTE — Telephone Encounter (Signed)
Per [facility] Website "This member's plan does not currently require notification or prior-authorization through the [place] Notification or Prior-Authorization Program. Please contact a Customer Care Professional at [phone] if you believe the information returned to be in error." ? ?Called pt. Provided CT A/P appt details. She voiced understanding and had no questions ?

## 2021-05-19 ENCOUNTER — Telehealth: Payer: Self-pay | Admitting: Internal Medicine

## 2021-05-19 LAB — LIPASE: Lipase: 74 U/L — ABNORMAL HIGH (ref 7–60)

## 2021-05-19 LAB — CBC WITH DIFFERENTIAL/PLATELET
Absolute Monocytes: 320 cells/uL (ref 200–950)
Basophils Absolute: 18 cells/uL (ref 0–200)
Basophils Relative: 0.4 %
Eosinophils Absolute: 212 cells/uL (ref 15–500)
Eosinophils Relative: 4.7 %
HCT: 38.6 % (ref 35.0–45.0)
Hemoglobin: 12.9 g/dL (ref 11.7–15.5)
Lymphs Abs: 1868 cells/uL (ref 850–3900)
MCH: 29.8 pg (ref 27.0–33.0)
MCHC: 33.4 g/dL (ref 32.0–36.0)
MCV: 89.1 fL (ref 80.0–100.0)
MPV: 10.9 fL (ref 7.5–12.5)
Monocytes Relative: 7.1 %
Neutro Abs: 2084 cells/uL (ref 1500–7800)
Neutrophils Relative %: 46.3 %
Platelets: 222 10*3/uL (ref 140–400)
RBC: 4.33 10*6/uL (ref 3.80–5.10)
RDW: 13.3 % (ref 11.0–15.0)
Total Lymphocyte: 41.5 %
WBC: 4.5 10*3/uL (ref 3.8–10.8)

## 2021-05-19 LAB — COMPLETE METABOLIC PANEL WITH GFR
AG Ratio: 1.2 (calc) (ref 1.0–2.5)
ALT: 24 U/L (ref 6–29)
AST: 28 U/L (ref 10–35)
Albumin: 4.2 g/dL (ref 3.6–5.1)
Alkaline phosphatase (APISO): 27 U/L — ABNORMAL LOW (ref 37–153)
BUN: 19 mg/dL (ref 7–25)
CO2: 28 mmol/L (ref 20–32)
Calcium: 9.4 mg/dL (ref 8.6–10.4)
Chloride: 105 mmol/L (ref 98–110)
Creat: 0.78 mg/dL (ref 0.50–1.03)
Globulin: 3.4 g/dL (calc) (ref 1.9–3.7)
Glucose, Bld: 102 mg/dL (ref 65–139)
Potassium: 4.2 mmol/L (ref 3.5–5.3)
Sodium: 139 mmol/L (ref 135–146)
Total Bilirubin: 0.7 mg/dL (ref 0.2–1.2)
Total Protein: 7.6 g/dL (ref 6.1–8.1)
eGFR: 88 mL/min/{1.73_m2} (ref >=60)

## 2021-05-19 NOTE — Telephone Encounter (Signed)
Patient saw her lab results and wanted to speak to [facility],  I told her I would have a nurse call her  ?

## 2021-05-19 NOTE — Telephone Encounter (Signed)
See lab result note for details.

## 2021-05-19 NOTE — Telephone Encounter (Signed)
LMOM for pt to call office back 

## 2021-05-23 ENCOUNTER — Ambulatory Visit (HOSPITAL_COMMUNITY)
Admission: RE | Admit: 2021-05-23 | Discharge: 2021-05-23 | Disposition: A | Discharge status: Home / Self Care | Payer: 59 | Source: Ambulatory Visit | Attending: Gastroenterology | Admitting: Gastroenterology

## 2021-05-23 ENCOUNTER — Other Ambulatory Visit: Payer: Self-pay

## 2021-05-23 DIAGNOSIS — R1012 Left upper quadrant pain: Secondary | ICD-10-CM | POA: Diagnosis present

## 2021-05-23 DIAGNOSIS — R748 Abnormal levels of other serum enzymes: Secondary | ICD-10-CM

## 2021-05-23 DIAGNOSIS — Z8719 Personal history of other diseases of the digestive system: Secondary | ICD-10-CM | POA: Diagnosis present

## 2021-05-23 DIAGNOSIS — R1013 Epigastric pain: Secondary | ICD-10-CM | POA: Insufficient documentation

## 2021-05-23 DIAGNOSIS — M545 Low back pain, unspecified: Secondary | ICD-10-CM | POA: Insufficient documentation

## 2021-05-23 MED ORDER — IOHEXOL 350 MG/ML SOLN
80.0000 mL | Freq: Once | INTRAVENOUS | Status: AC | PRN
  Administered 2021-05-23: 80 mL via INTRAVENOUS

## 2021-05-25 ENCOUNTER — Other Ambulatory Visit: Payer: Self-pay | Admitting: Gastroenterology

## 2021-05-25 ENCOUNTER — Ambulatory Visit (HOSPITAL_COMMUNITY): Payer: 59

## 2021-05-25 DIAGNOSIS — R1010 Upper abdominal pain, unspecified: Secondary | ICD-10-CM

## 2021-05-25 MED ORDER — PANTOPRAZOLE SODIUM 40 MG PO TBEC: 40.0000 mg | DELAYED_RELEASE_TABLET | Freq: Two times a day (BID) | ORAL | 3 refills | Status: DC

## 2021-05-30 ENCOUNTER — Other Ambulatory Visit: Payer: Self-pay | Admitting: Nurse Practitioner

## 2021-05-30 ENCOUNTER — Other Ambulatory Visit (HOSPITAL_COMMUNITY): Payer: Self-pay | Admitting: Nurse Practitioner

## 2021-05-30 ENCOUNTER — Other Ambulatory Visit (HOSPITAL_BASED_OUTPATIENT_CLINIC_OR_DEPARTMENT_OTHER): Payer: Self-pay | Admitting: Nurse Practitioner

## 2021-05-30 DIAGNOSIS — R918 Other nonspecific abnormal finding of lung field: Secondary | ICD-10-CM

## 2021-06-06 ENCOUNTER — Other Ambulatory Visit: Payer: Self-pay

## 2021-06-06 ENCOUNTER — Ambulatory Visit (HOSPITAL_BASED_OUTPATIENT_CLINIC_OR_DEPARTMENT_OTHER)
Admission: RE | Admit: 2021-06-06 | Discharge: 2021-06-06 | Disposition: A | Discharge status: Home / Self Care | Payer: 59 | Source: Ambulatory Visit | Attending: Nurse Practitioner | Admitting: Nurse Practitioner

## 2021-06-06 DIAGNOSIS — R918 Other nonspecific abnormal finding of lung field: Secondary | ICD-10-CM | POA: Insufficient documentation

## 2021-07-19 NOTE — Progress Notes (Incomplete)
? ? ?Referring Provider: Anabel Halon, MD ?Primary Care Physician:  Roe Rutherford, NP ?Primary GI Physician: Dr. Marland Kitchen ? ?No chief complaint on file. ? ? ?HPI:   ?Jenna Love is a 61 y.o. female presenting today for follow-up of upper abdominal pain, change in bowel habits, and to discuss rescheduling colonoscopy. ? ?Last seen in our office 02/14/2021.  She reported intermittent epigastric and LUQ burning that could be present upon waking, or postprandial, and occasionally prior to bowel movements, but without improvement following a bowel movement.  Some improvement with Pepcid which she was taking sparingly.  Also reported change in bowel habits from regular bowel movements daily to skipping days between bowel movements, stool consistency now mushy.  No NSAIDs.  Noted prior CT in November 2021 with mild inflammatory or infectious colitis involving splenic flexure.  Recommended updating labs, EGD and colonoscopy, start Protonix 40 mg daily, start Benefiber, and add MiraLAX if needed. ? ?Labs completed 02/21/2021.  CBC, CMP within normal limits.  Lipase mildly elevated at 74. ? ?Procedures 03/24/21 ?EGD: Normal esophagus, gastric erosions and nodularity, multiple 2-5 mm hyperplastic appearing polyps in the stomach s/p biopsy, duodenal erosions.  Pathology with slight chronic inflammation in the stomach, no H. pylori, fundic gland polyp. ?Colonoscopy: Diverticulosis in the sigmoid colon, poor prep precluded completion of colonoscopy. ? ?Spoke with patient on 2/28.  She reported she continued with epigastric and left upper quadrant abdominal pain and was also having left lower back pain.  Symptoms were similar to her office visit, but has been worsening and pain was constant for the last 2 days.  She was taking Protonix daily, usually no NSAIDs, but had been taking ibuprofen for the last 2 nights.  Bowels moving every 2 days with Benefiber.  Recommended updating labs, CT, further recommendations to  follow. ? ?Labs completed 05/18/2021 with CBC and CMP within normal limits.  Lipase again slightly elevated at 74. ?CT A/P with contrast 05/23/2021 with no acute findings in the abdomen or pelvis.  Incompletely imaged groundglass opacity in the right middle lobe of the lung with recommendations for dedicated CT chest.  2.2 cm left adrenal nodule, stable, likely benign adenoma.  Hepatic steatosis.  Recommended following with PCP regarding lung lesion, increase Protonix to 40 mg twice daily, and start MiraLAX daily. ? ?Today: ? ?Abdominal pain:  ? ? ?Change in bowel habits/constipation:  ? ? ? ? ?Past Medical History:  ?Diagnosis Date  ? Anxiety   ? Chronic cough 05/20/2013  ? Panic attacks   ? Pneumonia   ? Thyroid nodule 2013  ? Benign biopsy- ENT Dr. Pollyann Kennedy  ? ? ?Past Surgical History:  ?Procedure Laterality Date  ? BIOPSY  03/24/2021  ? Procedure: BIOPSY;  Surgeon: Corbin Ade, MD;  [facility]: [facility];  Service: Endoscopy;;  ? CESAREAN SECTION  1992  ? COLONOSCOPY WITH PROPOFOL N/A 11/05/2014  ? One 5 mm sessile polyp in base of cecum. otherwise normal. fecal material on path. Surveillance in 5 years.   ? COLONOSCOPY WITH PROPOFOL N/A 03/24/2021  ? Procedure: COLONOSCOPY WITH PROPOFOL;  Surgeon: Corbin Ade, MD;  [facility];  Service: Endoscopy;  Laterality: N/A;  12:15pm  ? ESOPHAGOGASTRODUODENOSCOPY (EGD) WITH PROPOFOL N/A 03/24/2021  ? Procedure: ESOPHAGOGASTRODUODENOSCOPY (EGD) WITH PROPOFOL;  Surgeon: Corbin Ade, MD;  [facility];  Service: Endoscopy;  Laterality: N/A;  ? HYSTEROSCOPY WITH D & C N/A 10/03/2013  ? Procedure: DILATATION AND CURETTAGE /HYSTEROSCOPY;  Surgeon: Christin Bach  V, MD;  Location: [facility];  Service: Gynecology;  Laterality: N/A;  ? HYSTEROSCOPY WITH D & C N/A 11/05/2019  ? Procedure: DILATATION AND CURETTAGE HYSTEROSCOPY;  Surgeon: Tilda BurrowFerguson, John V, MD;  Location: [facility];  Service: Gynecology;  Laterality: N/A;  ? POLYPECTOMY N/A 10/03/2013  ? Procedure:  REMOVAL OF ENDOMETRIAL POLYP;  Surgeon: Tilda BurrowJohn Ferguson V, MD;  Location: [facility];  Service: Gynecology;  Laterality: N/A;  ? POLYPECTOMY N/A 11/05/2014  ? Procedure: POLYPECTOMY;  Surgeon: Corbin Adeobert M Rourk, MD;  Location: [facility];  Service: Endoscopy;  Laterality: N/A;  ? uterus polyp    ? VULVA /PERINEUM BIOPSY Right 11/05/2019  ? Procedure: VULVAR BIOPSY;  Surgeon: Tilda BurrowFerguson, John V, MD;  Location: [facility];  Service: Gynecology;  Laterality: Right;  ? ? ?Current Outpatient Medications  ?Medication Sig Dispense Refill  ? Cholecalciferol (VITAMIN D) 2000 units CAPS Take 2,000 Units by mouth daily.    ? clonazePAM (KLONOPIN) 1 MG tablet TAKE 1 TABLET BY MOUTH 3 TIMES A DAY AS NEEDED FOR ANXIETY. 90 tablet 1  ? pantoprazole (PROTONIX) 40 MG tablet Take 1 tablet (40 mg total) by mouth 2 (two) times daily before a meal. 60 tablet 3  ? polyethylene glycol-electrolytes (NULYTELY) 420 g solution As directed 4000 mL 0  ? sertraline (ZOLOFT) 100 MG tablet Take 1.5 tablets (150 mg total) by mouth daily. 135 tablet 1  ? vitamin E 1000 UNIT capsule Take 1,000 Units by mouth daily.    ? zolpidem (AMBIEN) 10 MG tablet Take 1 tablet (10 mg total) by mouth at bedtime as needed. for insomnia (Patient taking differently: Take 10 mg by mouth at bedtime. for insomnia) 90 tablet 0  ? ?No current facility-administered medications for this visit.  ? ? ?Allergies as of 07/20/2021  ? (No Known Allergies)  ? ? ?Family History  ?Problem Relation Age of Onset  ? Diabetes Mother   ? Alzheimer's disease Mother   ? Cancer Father   ?     lung   ? Cancer Paternal Grandfather   ? Hodgkin's lymphoma Son   ? Colon cancer Neg Hx   ? Colon polyps Neg Hx   ? ? ?Social History  ? ?Socioeconomic History  ? Marital status: Married  ?  Spouse name: Not on file  ? Number of children: 2  ? Years of education: Not on file  ? Highest education level: Not on file  ?Occupational History  ? Not on file  ?Tobacco Use  ? Smoking status: Never  ? Smokeless tobacco: Never   ?Vaping Use  ? Vaping Use: Never used  ?Substance and Sexual Activity  ? Alcohol use: No  ? Drug use: No  ? Sexual activity: Not Currently  ?  Birth control/protection: Post-menopausal  ?Other Topics Concern  ? Not on file  ?Social History Narrative  ? Not on file  ? ?Social Determinants of Health  ? ?Financial Resource Strain: Not on file  ?Food Insecurity: Not on file  ?Transportation Needs: Not on file  ?Physical Activity: Not on file  ?Stress: Not on file  ?Social Connections: Not on file  ? ? ?Review of Systems: ?Gen: Denies fever, chills, cold or flulike symptoms, presyncope, syncope. ?CV: Denies chest pain or palpitations. ?Resp: Denies dyspnea or cough. ?GI: See HPI ?Heme: See HPI ? ?Physical Exam: ?LMP 05/25/2014  ?General:   Alert and oriented. No distress noted. Pleasant and cooperative.  ?Head:  Normocephalic and atraumatic. ?Eyes:  Conjuctiva clear without scleral icterus. ?Heart:  S1, S2 present without murmurs appreciated. ?Lungs:  Clear to auscultation bilaterally. No wheezes, rales, or rhonchi. No distress.  ?Abdomen:  +BS, soft, non-tender and non-distended. No rebound or guarding. No HSM or masses noted. ?Msk:  Symmetrical without gross deformities. Normal posture. ?Extremities:  Without edema. ?Neurologic:  Alert and  oriented x4 ?Psych:  Normal mood and affect. ? ? ? ?Assessment:  ? ? ? ?Plan:  ?*** ? ? ?Ermalinda Memos, PA-C ?[facility] ?07/20/2021  ?

## 2021-07-20 ENCOUNTER — Ambulatory Visit: Payer: 59 | Admitting: Gastroenterology

## 2021-07-20 ENCOUNTER — Encounter: Payer: Self-pay | Admitting: Internal Medicine

## 2021-08-16 ENCOUNTER — Encounter: Payer: Self-pay | Admitting: Emergency Medicine

## 2021-08-16 ENCOUNTER — Other Ambulatory Visit: Payer: Self-pay

## 2021-08-16 ENCOUNTER — Ambulatory Visit
Admission: EM | Admit: 2021-08-16 | Discharge: 2021-08-16 | Disposition: A | Discharge status: Home / Self Care | Payer: 59 | Attending: Family Medicine | Admitting: Family Medicine

## 2021-08-16 DIAGNOSIS — J069 Acute upper respiratory infection, unspecified: Secondary | ICD-10-CM

## 2021-08-16 MED ORDER — PROMETHAZINE-DM 6.25-15 MG/5ML PO SYRP: 5.0000 mL | ORAL_SOLUTION | Freq: Four times a day (QID) | ORAL | 0 refills | Status: DC | PRN

## 2021-08-16 MED ORDER — MOLNUPIRAVIR EUA 200MG CAPSULE: 4.0000 | ORAL_CAPSULE | Freq: Two times a day (BID) | ORAL | 0 refills | Status: AC

## 2021-08-16 MED ORDER — ALBUTEROL SULFATE HFA 108 (90 BASE) MCG/ACT IN AERS
1.0000 | INHALATION_SPRAY | Freq: Four times a day (QID) | RESPIRATORY_TRACT | 0 refills | Status: DC | PRN
Start: 2021-08-16 — End: 2021-11-22

## 2021-08-16 NOTE — ED Triage Notes (Signed)
Pt reports nasal congestion, ear fullness, cough, sore throat and intermittent nausea since Sunday.

## 2021-08-16 NOTE — ED Provider Notes (Signed)
[facility]    CSN: [id number] Arrival date & time: 08/16/21  3299      History   Chief Complaint Chief Complaint  Patient presents with   Cough    HPI Jenna Love is a 61 y.o. female.   Presenting today with 2-day history of fever, chills, body aches, nasal congestion, ear pressure and pain, cough, sore throat, intermittent nausea, chest tightness.  Denies chest pain, significant shortness of breath, abdominal pain, vomiting, diarrhea, rashes.  So far taking Benadryl and Tylenol with minimal relief.  Has a history of nonspecific pulmonary issues currently being worked up, does not have any inhalers home per patient.   Past Medical History:  Diagnosis Date   Anxiety    Chronic cough 05/20/2013   Panic attacks    Pneumonia    Thyroid nodule 2013   Benign biopsy- ENT Dr. Pollyann Kennedy   Patient Active Problem List   Diagnosis Date Noted   Pain of upper abdomen 02/14/2021   Change in bowel habits 02/14/2021   Non-recurrent acute suppurative otitis media of both ears without spontaneous rupture of tympanic membranes 05/24/2020   Chronic fatigue 05/24/2020   Constipation 09/18/2019   Pelvic pain 06/10/2019   Encounter for gynecological examination with Papanicolaou smear of cervix 10/22/2018   Fatty liver disease, nonalcoholic 04/01/2018   Pulmonary nodule, left 04/12/2016   History of colonic polyps    Encounter for screening colonoscopy 10/13/2014   High risk medication use 10/13/2014   Peripheral edema 05/06/2014   Thyroid nodule 04/15/2013   Hypertriglyceridemia 02/21/2013   MDD (major depressive disorder) (HCC) 06/29/2011   Menorrhagia 06/15/2011   Insomnia 04/14/2011   Class 3 obesity (HCC) 04/14/2011   GAD (generalized anxiety disorder) 10/23/2009    Past Surgical History:  Procedure Laterality Date   BIOPSY  03/24/2021   Procedure: BIOPSY;  Surgeon: Corbin Ade, MD;  Location: [facility];  Service: Endoscopy;;   CESAREAN SECTION  1992    COLONOSCOPY WITH PROPOFOL N/A 11/05/2014   One 5 mm sessile polyp in base of cecum. otherwise normal. fecal material on path. Surveillance in 5 years.    COLONOSCOPY WITH PROPOFOL N/A 03/24/2021   Procedure: COLONOSCOPY WITH PROPOFOL;  Surgeon: Corbin Ade, MD;  Location: [facility];  Service: Endoscopy;  Laterality: N/A;  12:15pm   ESOPHAGOGASTRODUODENOSCOPY (EGD) WITH PROPOFOL N/A 03/24/2021   Procedure: ESOPHAGOGASTRODUODENOSCOPY (EGD) WITH PROPOFOL;  Surgeon: Corbin Ade, MD;  Location: [facility];  Service: Endoscopy;  Laterality: N/A;   HYSTEROSCOPY WITH D & C N/A 10/03/2013   Procedure: DILATATION AND CURETTAGE /HYSTEROSCOPY;  Surgeon: Tilda Burrow, MD;  Location: [facility];  Service: Gynecology;  Laterality: N/A;   HYSTEROSCOPY WITH D & C N/A 11/05/2019   Procedure: DILATATION AND CURETTAGE HYSTEROSCOPY;  Surgeon: Tilda Burrow, MD;  Location: [facility];  Service: Gynecology;  Laterality: N/A;   POLYPECTOMY N/A 10/03/2013   Procedure: REMOVAL OF ENDOMETRIAL POLYP;  Surgeon: Tilda Burrow, MD;  Location: [facility];  Service: Gynecology;  Laterality: N/A;   POLYPECTOMY N/A 11/05/2014   Procedure: POLYPECTOMY;  Surgeon: Corbin Ade, MD;  Location: [facility];  Service: Endoscopy;  Laterality: N/A;   uterus polyp     VULVA /PERINEUM BIOPSY Right 11/05/2019   Procedure: VULVAR BIOPSY;  Surgeon: Tilda Burrow, MD;  Location: [facility];  Service: Gynecology;  Laterality: Right;    OB History     Gravida  2   Para  2  Term  2   Preterm      AB      Living  2      SAB      IAB      Ectopic      Multiple      Live Births  2            Home Medications    Prior to Admission medications   Medication Sig Start Date End Date Taking? Authorizing Provider  albuterol (VENTOLIN HFA) 108 (90 Base) MCG/ACT inhaler Inhale 1-2 puffs into the lungs every 6 (six) hours as needed for wheezing or shortness of breath. 08/16/21  Yes Particia Nearing, PA-C  molnupiravir EUA  (LAGEVRIO) 200 mg CAPS capsule Take 4 capsules (800 mg total) by mouth 2 (two) times daily for 5 days. 08/16/21 08/21/21 Yes Particia Nearing, PA-C  promethazine-dextromethorphan (PROMETHAZINE-DM) 6.25-15 MG/5ML syrup Take 5 mLs by mouth 4 (four) times daily as needed. 08/16/21  Yes Particia Nearing, PA-C  Cholecalciferol (VITAMIN D) 2000 units CAPS Take 2,000 Units by mouth daily.    [provider]  clonazePAM (KLONOPIN) 1 MG tablet TAKE 1 TABLET BY MOUTH 3 TIMES A DAY AS NEEDED FOR ANXIETY. 08/26/20   Anabel Halon, MD  pantoprazole (PROTONIX) 40 MG tablet Take 1 tablet (40 mg total) by mouth 2 (two) times daily before a meal. 05/25/21   Letta Median, PA-C  polyethylene glycol-electrolytes (NULYTELY) 420 g solution As directed 03/03/21   Rourk, Gerrit Friends, MD  sertraline (ZOLOFT) 100 MG tablet Take 1.5 tablets (150 mg total) by mouth daily. 05/13/20   Salley Scarlet, MD  vitamin E 1000 UNIT capsule Take 1,000 Units by mouth daily.    [provider]  zolpidem (AMBIEN) 10 MG tablet Take 1 tablet (10 mg total) by mouth at bedtime as needed. for insomnia Patient taking differently: Take 10 mg by mouth at bedtime. for insomnia 10/25/20   Anabel Halon, MD    Family History Family History  Problem Relation Age of Onset   Diabetes Mother    Alzheimer's disease Mother    Cancer Father        lung    Cancer Paternal Grandfather    Hodgkin's lymphoma Son    Colon cancer Neg Hx    Colon polyps Neg Hx     Social History Social History   Tobacco Use   Smoking status: Never   Smokeless tobacco: Never  Vaping Use   Vaping Use: Never used  Substance Use Topics   Alcohol use: No   Drug use: No     Allergies   Patient has no known allergies.   Review of Systems Review of Systems PER HPI  Physical Exam Triage Vital Signs ED Triage Vitals [08/16/21 0857]  Enc Vitals Group     BP (!) 159/82     Pulse Rate 94     Resp 18     Temp 99.1 F (37.3 C)      Temp Source Oral     SpO2 96 %     Weight 260 lb (117.9 kg)     Height 5\' 3"  (1.6 m)     Head Circumference      Peak Flow      Pain Score 7     Pain Loc      Pain Edu?      Excl. in GC?    No data found.  Updated Vital Signs BP )  159/82 (BP Location: Right Arm)   Pulse 94   Temp 99.1 F (37.3 C) (Oral)   Resp 18   Ht  (1.6 m)   Wt 260 lb (117.9 kg)   LMP 05/25/2014   SpO2 96%   BMI 46.06 kg/m   Visual Acuity Right Eye Distance:   Left Eye Distance:   Bilateral Distance:    Right Eye Near:   Left Eye Near:    Bilateral Near:     Physical Exam Vitals and nursing note reviewed.  Constitutional:      Appearance: Normal appearance. She is not ill-appearing.  HENT:     Head: Atraumatic.     Right Ear: Tympanic membrane and external ear normal.     Left Ear: Tympanic membrane and external ear normal.     Nose: Rhinorrhea present.     Mouth/Throat:     Mouth: Mucous membranes are moist.     Pharynx: Posterior oropharyngeal erythema present.  Eyes:     Extraocular Movements: Extraocular movements intact.     Conjunctiva/sclera: Conjunctivae normal.  Cardiovascular:     Rate and Rhythm: Normal rate and regular rhythm.     Heart sounds: Normal heart sounds.  Pulmonary:     Effort: Pulmonary effort is normal.     Breath sounds: Normal breath sounds. No wheezing or rales.  Musculoskeletal:        General: Normal range of motion.     Cervical back: Normal range of motion and neck supple.  Skin:    General: Skin is warm and dry.  Neurological:     Mental Status: She is alert and oriented to person, place, and time.  Psychiatric:        Mood and Affect: Mood normal.        Thought Content: Thought content normal.        Judgment: Judgment normal.   UC Treatments / Results  Labs (all labs ordered are listed, but only abnormal results are displayed) Labs Reviewed  COVID-19, FLU A+B NAA    EKG   Radiology No results found.  Procedures Procedures  (including critical care time)  Medications Ordered in UC Medications - No data to display  Initial Impression / Assessment and Plan / UC Course  I have reviewed the triage vital signs and the nursing notes.  Pertinent labs & imaging results that were available during my care of the patient were reviewed by me and considered in my medical decision making (see chart for details).     Vital signs and exam overall reassuring and suggestive of a viral upper respiratory infection.  COVID and flu testing pending, given continued exposures and symptoms do strongly suspect COVID so we will start molnupiravir in addition to albuterol, Phenergan DM for symptomatic benefit.  Adjust as needed based on results.  Follow-up for worsening symptoms.  Work note given.  Final Clinical Impressions(s) / UC Diagnoses   Final diagnoses:  Viral URI with cough   Discharge Instructions   None    ED Prescriptions     Medication Sig Dispense Auth. Provider   molnupiravir EUA (LAGEVRIO) 200 mg CAPS capsule Take 4 capsules (800 mg total) by mouth 2 (two) times daily for 5 days. 40 capsule Particia Nearing, [place]   albuterol (VENTOLIN HFA) 108 (90 Base) MCG/ACT inhaler Inhale 1-2 puffs into the lungs every 6 (six) hours as needed for wheezing or shortness of breath. 18 g Particia Nearing, PA-C   promethazine-dextromethorphan (PROMETHAZINE-DM) 6.25-15  MG/5ML syrup Take 5 mLs by mouth 4 (four) times daily as needed. 100 mL Particia NearingLane, My Rinke [place], [place]-C      PDMP not reviewed this encounter.   Particia NearingLane, Lennin Osmond [place], [place] 08/16/21 1154

## 2021-08-17 ENCOUNTER — Telehealth: Payer: Self-pay | Admitting: Emergency Medicine

## 2021-08-17 LAB — COVID-19, FLU A+B NAA
Influenza A, NAA: NOT DETECTED
Influenza B, NAA: NOT DETECTED
SARS-CoV-2, NAA: NOT DETECTED

## 2021-08-17 NOTE — Telephone Encounter (Signed)
Pt called and inquired if should stop taking antivirals and could antibiotic be prescribed. Consulted NP and discussed with patient the need to discontinue the antivirals due to covid/flu a&b being negative and continue the other prescriptions to help with symptom management. If not any better in 5-7 days would need to be re-seen and that abx is not indicated at this time. Pt aware and verbalized understanding.

## 2021-09-09 ENCOUNTER — Other Ambulatory Visit: Payer: Self-pay | Admitting: Nurse Practitioner

## 2021-09-09 ENCOUNTER — Other Ambulatory Visit (HOSPITAL_BASED_OUTPATIENT_CLINIC_OR_DEPARTMENT_OTHER): Payer: Self-pay | Admitting: Nurse Practitioner

## 2021-09-09 DIAGNOSIS — R918 Other nonspecific abnormal finding of lung field: Secondary | ICD-10-CM

## 2021-09-15 ENCOUNTER — Ambulatory Visit (HOSPITAL_BASED_OUTPATIENT_CLINIC_OR_DEPARTMENT_OTHER)
Admission: RE | Admit: 2021-09-15 | Discharge: 2021-09-15 | Disposition: A | Discharge status: Home / Self Care | Payer: 59 | Source: Ambulatory Visit | Attending: Nurse Practitioner | Admitting: Nurse Practitioner

## 2021-09-15 DIAGNOSIS — R918 Other nonspecific abnormal finding of lung field: Secondary | ICD-10-CM | POA: Insufficient documentation

## 2021-11-04 ENCOUNTER — Other Ambulatory Visit (HOSPITAL_COMMUNITY): Payer: Self-pay | Admitting: Adult Health Nurse Practitioner

## 2021-11-04 DIAGNOSIS — Z1231 Encounter for screening mammogram for malignant neoplasm of breast: Secondary | ICD-10-CM

## 2021-11-07 ENCOUNTER — Ambulatory Visit (HOSPITAL_COMMUNITY)
Admission: RE | Admit: 2021-11-07 | Discharge: 2021-11-07 | Disposition: A | Discharge status: Home / Self Care | Payer: 59 | Source: Ambulatory Visit | Attending: Adult Health Nurse Practitioner | Admitting: Adult Health Nurse Practitioner

## 2021-11-07 DIAGNOSIS — Z1231 Encounter for screening mammogram for malignant neoplasm of breast: Secondary | ICD-10-CM | POA: Diagnosis present

## 2021-11-15 DIAGNOSIS — Z0289 Encounter for other administrative examinations: Secondary | ICD-10-CM

## 2021-11-22 ENCOUNTER — Encounter (INDEPENDENT_AMBULATORY_CARE_PROVIDER_SITE_OTHER): Payer: Self-pay | Admitting: Family Medicine

## 2021-11-22 ENCOUNTER — Ambulatory Visit (INDEPENDENT_AMBULATORY_CARE_PROVIDER_SITE_OTHER): Payer: 59 | Admitting: Family Medicine

## 2021-11-22 VITALS — BP 109/73 | HR 80 | Temp 97.5°F | Ht 63.0 in | Wt 265.0 lb

## 2021-11-22 DIAGNOSIS — R5383 Other fatigue: Secondary | ICD-10-CM

## 2021-11-22 DIAGNOSIS — F419 Anxiety disorder, unspecified: Secondary | ICD-10-CM | POA: Diagnosis not present

## 2021-11-22 DIAGNOSIS — K760 Fatty (change of) liver, not elsewhere classified: Secondary | ICD-10-CM

## 2021-11-22 DIAGNOSIS — Z6841 Body Mass Index (BMI) 40.0 and over, adult: Secondary | ICD-10-CM

## 2021-11-22 DIAGNOSIS — R0602 Shortness of breath: Secondary | ICD-10-CM

## 2021-11-22 DIAGNOSIS — E781 Pure hyperglyceridemia: Secondary | ICD-10-CM

## 2021-11-22 DIAGNOSIS — R7303 Prediabetes: Secondary | ICD-10-CM

## 2021-11-22 DIAGNOSIS — F32A Depression, unspecified: Secondary | ICD-10-CM

## 2021-11-22 MED ORDER — BUPROPION HCL ER (XL) 150 MG PO TB24: 150.0000 mg | ORAL_TABLET | Freq: Every day | ORAL | 0 refills | Status: DC

## 2021-11-23 LAB — COMPREHENSIVE METABOLIC PANEL
ALT: 33 IU/L — ABNORMAL HIGH (ref 0–32)
AST: 35 IU/L (ref 0–40)
Albumin/Globulin Ratio: 1.4 (ref 1.2–2.2)
Albumin: 4.6 g/dL (ref 3.8–4.9)
Alkaline Phosphatase: 33 IU/L — ABNORMAL LOW (ref 44–121)
BUN/Creatinine Ratio: 20 (ref 9–23)
BUN: 16 mg/dL (ref 6–24)
Bilirubin Total: 0.9 mg/dL (ref 0.0–1.2)
CO2: 25 mmol/L (ref 20–29)
Calcium: 9.5 mg/dL (ref 8.7–10.2)
Chloride: 100 mmol/L (ref 96–106)
Creatinine, Ser: 0.81 mg/dL (ref 0.57–1.00)
Globulin, Total: 3.4 g/dL (ref 1.5–4.5)
Glucose: 109 mg/dL — ABNORMAL HIGH (ref 70–99)
Potassium: 4.5 mmol/L (ref 3.5–5.2)
Sodium: 138 mmol/L (ref 134–144)
Total Protein: 8 g/dL (ref 6.0–8.5)
eGFR: 84 mL/min/{1.73_m2} (ref >59)

## 2021-11-23 LAB — CBC WITH DIFFERENTIAL/PLATELET
Basophils Absolute: 0 10*3/uL (ref 0.0–0.2)
Basos: 1 %
EOS (ABSOLUTE): 0.3 10*3/uL (ref 0.0–0.4)
Eos: 6 %
Hematocrit: 40.8 % (ref 34.0–46.6)
Hemoglobin: 13.8 g/dL (ref 11.1–15.9)
Immature Grans (Abs): 0 10*3/uL (ref 0.0–0.1)
Immature Granulocytes: 0 %
Lymphocytes Absolute: 1.6 10*3/uL (ref 0.7–3.1)
Lymphs: 41 %
MCH: 30.5 pg (ref 26.6–33.0)
MCHC: 33.8 g/dL (ref 31.5–35.7)
MCV: 90 fL (ref 79–97)
Monocytes Absolute: 0.3 10*3/uL (ref 0.1–0.9)
Monocytes: 8 %
Neutrophils Absolute: 1.7 10*3/uL (ref 1.4–7.0)
Neutrophils: 44 %
Platelets: 237 10*3/uL (ref 150–450)
RBC: 4.53 x10E6/uL (ref 3.77–5.28)
RDW: 13.6 % (ref 11.7–15.4)
WBC: 3.9 10*3/uL (ref 3.4–10.8)

## 2021-11-23 LAB — T3: T3, Total: 117 ng/dL (ref 71–180)

## 2021-11-23 LAB — TSH: TSH: 2.43 u[IU]/mL (ref 0.450–4.500)

## 2021-11-23 LAB — LIPID PANEL WITH LDL/HDL RATIO
Cholesterol, Total: 175 mg/dL (ref 100–199)
HDL: 64 mg/dL (ref >39)
LDL Chol Calc (NIH): 92 mg/dL (ref 0–99)
LDL/HDL Ratio: 1.4 ratio (ref 0.0–3.2)
Triglycerides: 107 mg/dL (ref 0–149)
VLDL Cholesterol Cal: 19 mg/dL (ref 5–40)

## 2021-11-23 LAB — VITAMIN D 25 HYDROXY (VIT D DEFICIENCY, FRACTURES): Vit D, 25-Hydroxy: 34.7 ng/mL (ref 30.0–100.0)

## 2021-11-23 LAB — HEMOGLOBIN A1C
Est. average glucose Bld gHb Est-mCnc: 120 mg/dL
Hgb A1c MFr Bld: 5.8 % — ABNORMAL HIGH (ref 4.8–5.6)

## 2021-11-23 LAB — T4, FREE: Free T4: 1.16 ng/dL (ref 0.82–1.77)

## 2021-11-23 LAB — FOLATE: Folate: 13.4 ng/mL (ref >3.0)

## 2021-11-23 LAB — VITAMIN B12: Vitamin B-12: 728 pg/mL (ref 232–1245)

## 2021-11-23 LAB — INSULIN, RANDOM: INSULIN: 22 u[IU]/mL (ref 2.6–24.9)

## 2021-11-29 NOTE — Progress Notes (Signed)
Chief Complaint:   OBESITY Jenna Love (MR# [id number]) is a 61 y.o. female who presents for evaluation and treatment of obesity and related comorbidities. Current BMI is Body mass index is 46.94 kg/m. Jenna Love has been struggling with her weight for many years and has been unsuccessful in either losing weight, maintaining weight loss, or reaching her healthy weight goal.  Jenna Love was referred by boyfriend. 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 2 days in office and 3 days at home. Widowed. She enjoyed [employer] which she did previously. Eating out 5x a week at lunch at [employer] or [employer] or [employer]. Skips dinner frequently. [employer] egg Mcmuffin in am--satisfied +/-hashbrown-11:30 am-salad from [employer] +toast (satisfied). Snacking throughout the day--cookies can eat up to a pack/day)--eats due to stress (6 cookies at a time). Goes to bed around 6pm and wakes up around 6am.  Jenna Love is currently in the action stage of change and ready to dedicate time achieving and maintaining a healthier weight. Jenna Love is interested in becoming our patient and working on intensive lifestyle modifications including (but not limited to) diet and exercise for weight loss.  Jenna Love's habits were reviewed today and are as follows: she struggles with family and or coworkers weight loss sabotage, her desired weight loss is 76 pounds, she has been heavy most of her life, she started gaining weight after pregnancies, menopause, her heaviest weight ever was 275 pounds, she has significant food cravings issues, she skips meals frequently, she frequently makes poor food choices, she has problems with excessive hunger, she frequently eats larger portions than normal, and she struggles with emotional eating.  Depression Screen Jenna Love's Food and Mood (modified PHQ-9) score was 20.     11/22/2021    8:56 AM  Depression screen PHQ 2/9  Decreased Interest 3  Down, Depressed, Hopeless 2  PHQ - 2  Score 5  Altered sleeping 3  Tired, decreased energy 3  Change in appetite 3  Feeling bad or failure about yourself  2  Trouble concentrating 1  Moving slowly or fidgety/restless 3  Suicidal thoughts 0  PHQ-9 Score 20  Difficult doing work/chores Very difficult   Subjective:   1. Other fatigue EKG--NSR at 82 bpm. Jenna Love admits to daytime somnolence and admits to waking up still tired. Patient has a history of symptoms of morning fatigue. Jenna Love generally gets 5 hours of sleep per night, and states that she has nightime awakenings. Snoring is present. Apneic episodes are present. Epworth Sleepiness Score is 4.    2. SOBOE (shortness of breath on exertion) Jenna Love notes increasing shortness of breath with exercising and seems to be worsening over time with weight gain. She notes getting out of breath sooner with activity than she used to. This has not gotten worse recently. Jenna Love denies shortness of breath at rest or orthopnea.   3. Prediabetes A1c elevated inconsistently for last 10 years.  4. Hypertriglyceridemia Previously elevated Trigly but last FLP was within normal limits.  5. Fatty liver disease, nonalcoholic Historical diagnoses.  6. Anxiety and depression Jenna Love is on Zoloft 100 mg and Klonopin (takes 2-3x/mo) as needed. Previously on Wellbutrin but switched due to increase in anxiety. She does recognize she is doing quite a bit of emotional eating.  Assessment/Plan:   1. Other fatigue We will obtain labs today. Aydia does feel that her weight is causing her energy to be lower than it should be. Fatigue may be related to obesity, depression or many other causes. Labs  will be ordered, and in the meanwhile, Indya will focus on self care including making healthy food choices, increasing physical activity and focusing on stress reduction.   - EKG 12-Lead - Vitamin B12 - Folate - VITAMIN D 25 Hydroxy (Vit-D Deficiency, Fractures) - TSH - T4, free - T3  2. SOBOE  (shortness of breath on exertion) We will obtain labs today. Marigene does feel that she gets out of breath more easily that she used to when she exercises. Kanita's shortness of breath appears to be obesity related and exercise induced. She has agreed to work on weight loss and gradually increase exercise to treat her exercise induced shortness of breath. Will continue to monitor closely.   - CBC with Differential/Platelet  3. Prediabetes We will obtain labs today.  - Hemoglobin A1c - Insulin, random  4. Hypertriglyceridemia We will obtain labs today.  - Lipid Panel With LDL/HDL Ratio  5. Fatty liver disease, nonalcoholic We will obtain labs today.  - Comprehensive metabolic panel  6. Anxiety and depression We will refill Wellbutrin 150 mg by mouth daily for 1 month with 0 refills. Patient will follow up with Roe Rutherford; Did discuss starting therapy for significant grief and working thru family health issues.   -Refill buPROPion (WELLBUTRIN XL) 150 MG 24 hr tablet; Take 1 tablet (150 mg total) by mouth daily.  Dispense: 30 tablet; Refill: 0  7. Class 3 severe obesity with serious comorbidity and body mass index (BMI) of 45.0 to 49.9 in adult, unspecified obesity type (HCC) Jenna Love is currently in the action stage of change and her goal is to continue with weight loss efforts. I recommend Jenna Love begin the structured treatment plan as follows:  She has agreed to the Category 3 Plan and keeping a food journal and adhering to recommended goals of 450-600 calories and 40+ grams of protein for supper.  Exercise goals: No exercise has been prescribed at this time.   Behavioral modification strategies: increasing lean protein intake, meal planning and cooking strategies, keeping healthy foods in the home, emotional eating strategies, and planning for success.  She was informed of the importance of frequent follow-up visits to maximize her success with intensive lifestyle modifications  for her multiple health conditions. She was informed we would discuss her lab results at her next visit unless there is a critical issue that needs to be addressed sooner. Jenna Love agreed to keep her next visit at the agreed upon time to discuss these results.  Objective:   Blood pressure 109/73, pulse 80, temperature (!) 97.5 F (36.4 C), height 5\' 3"  (1.6 m), weight 265 lb (120.2 kg), last menstrual period 05/25/2014, SpO2 98 %. Body mass index is 46.94 kg/m.  EKG: Normal sinus rhythm, rate 82 bpm.  Indirect Calorimeter completed today shows a VO2 of 253 and a REE of 1742.  Her calculated basal metabolic rate is 07/25/2014 thus her basal metabolic rate is worse than expected.  General: Cooperative, alert, well developed, in no acute distress. HEENT: Conjunctivae and lids unremarkable. Cardiovascular: Regular rhythm.  Lungs: Normal work of breathing. Neurologic: No focal deficits.   Lab Results  Component Value Date   CREATININE 0.81 11/22/2021   BUN 16 11/22/2021   NA 138 11/22/2021   K 4.5 11/22/2021   CL 100 11/22/2021   CO2 25 11/22/2021   Lab Results  Component Value Date   ALT 33 (H) 11/22/2021   AST 35 11/22/2021   ALKPHOS 33 (L) 11/22/2021   BILITOT 0.9 11/22/2021  Lab Results  Component Value Date   HGBA1C 5.8 (H) 11/22/2021   HGBA1C 5.6 05/24/2020   HGBA1C 5.5 11/05/2019   HGBA1C 5.6 10/01/2018   HGBA1C 5.7 (H) 04/01/2018   Lab Results  Component Value Date   INSULIN 22.0 11/22/2021   Lab Results  Component Value Date   TSH 2.430 11/22/2021   Lab Results  Component Value Date   CHOL 175 11/22/2021   HDL 64 11/22/2021   LDLCALC 92 11/22/2021   TRIG 107 11/22/2021   CHOLHDL 2.9 05/24/2020   Lab Results  Component Value Date   WBC 3.9 11/22/2021   HGB 13.8 11/22/2021   HCT 40.8 11/22/2021   MCV 90 11/22/2021   PLT 237 11/22/2021   No results found for: "IRON", "TIBC", "FERRITIN"  Attestation Statements:   Reviewed by clinician on day of visit:  allergies, medications, problem list, medical history, surgical history, family history, social history, and previous encounter notes.  I, Fortino Sic, RMA am acting as transcriptionist for Reuben Likes, MD.  This is the patient's first visit at [facility]. The patient's NEW PATIENT PACKET was reviewed at length. Included in the packet: current and past health history, medications, allergies, ROS, gynecologic history (women only), surgical history, family history, social history, weight history, weight loss surgery history (for those that have had weight loss surgery), nutritional evaluation, mood and food questionnaire, PHQ9, Epworth questionnaire, sleep habits questionnaire, patient life and health improvement goals questionnaire. These will all be scanned into the patient's chart under media.   During the visit, I independently reviewed the patient's EKG, bioimpedance scale results, and indirect calorimeter results. I used this information to tailor a meal plan for the patient that will help her to lose weight and will improve her obesity-related conditions going forward. I performed a medically necessary appropriate examination and/or evaluation. I discussed the assessment and treatment plan with the patient. The patient was provided an opportunity to ask questions and all were answered. The patient agreed with the plan and demonstrated an understanding of the instructions. Labs were ordered at this visit and will be reviewed at the next visit unless more critical results need to be addressed immediately. Clinical information was updated and documented in the EMR.   Time spent on visit including pre-visit chart review and post-visit care was 40 minutes.     I have reviewed the above documentation for accuracy and completeness, and I agree with the above. - Reuben Likes, MD

## 2021-12-06 ENCOUNTER — Encounter (INDEPENDENT_AMBULATORY_CARE_PROVIDER_SITE_OTHER): Payer: Self-pay

## 2021-12-06 ENCOUNTER — Ambulatory Visit (INDEPENDENT_AMBULATORY_CARE_PROVIDER_SITE_OTHER): Payer: 59 | Admitting: Family Medicine

## 2022-05-18 ENCOUNTER — Encounter: Payer: Self-pay | Admitting: Radiology

## 2022-06-07 ENCOUNTER — Other Ambulatory Visit (INDEPENDENT_AMBULATORY_CARE_PROVIDER_SITE_OTHER): Payer: 59

## 2022-06-07 ENCOUNTER — Encounter: Payer: Self-pay | Admitting: Orthopedic Surgery

## 2022-06-07 ENCOUNTER — Ambulatory Visit: Payer: 59 | Admitting: Orthopedic Surgery

## 2022-06-07 VITALS — BP 132/78 | HR 93 | Ht 63.0 in | Wt 261.0 lb

## 2022-06-07 DIAGNOSIS — M1712 Unilateral primary osteoarthritis, left knee: Secondary | ICD-10-CM

## 2022-06-07 DIAGNOSIS — M25562 Pain in left knee: Secondary | ICD-10-CM

## 2022-06-07 NOTE — Progress Notes (Signed)
Return patient Visit  Assessment: Jenna Love is a 62 y.o. female with the following: 1. Acute pain of left knee; posterolateral knee, in the setting of advanced degenerative changes, especially within the medial compartment  Plan: Jenna Love has pain in her left knee.  Radiographs demonstrate advanced degenerative changes, and a varus alignment overall.  Her pain is currently in the posterior lateral aspect of the left knee.  She recently increased her activity, and started to have pain.  She started taking ibuprofen yesterday, which has been helpful.  We discussed multiple options.  She will continue with ice, medications as needed, and consideration for topical treatments.  We can consider formal physical therapy.  If she starts to develop more pain in the knee, we can also consider some injections.  She will follow-up as needed.  Follow-up: Return if symptoms worsen or fail to improve.  Subjective:  Chief Complaint  Patient presents with   Leg Pain    Lt lower leg starting in the back of the knee going to the ankle for 1-2 wks. Pt states she has been using a rebounder recently that may be the cause.     History of Present Illness: Jenna Love is a 62 y.o. female who presents for evaluation of left knee pain.  I previously saw her for a left proximal fibula fracture, which has done well.  Recently, she started exercising using a rebounder.  Since then, she has started develop some pain in the posterior lateral aspect of the left knee.  She notes some spasming pain.  She states the pain gets worse at night.  She has recently started taking ibuprofen, which has been helpful.  No pain within the knee joint.   Review of Systems: No fevers or chills No numbness or tingling No chest pain No shortness of breath No bowel or bladder dysfunction No GI distress No headaches    Objective: BP 132/78   Pulse 93   Ht 5\' 3"  (1.6 m)   Wt 261 lb (118.4 kg)    LMP 05/25/2014   BMI 46.23 kg/m   Physical Exam:  General: Alert and oriented., No acute distress., and Obese female. Gait: Left sided antalgic gait.  Evaluation left knee demonstrates varus alignment overall.  No tenderness to palpation along the medial or lateral joint line.  No knee effusion.  Knee is stable to varus and valgus stress.  Negative Lachman.  Tenderness to palpation within the posterior and lateral aspect of the left knee.  This is more within the belly of the muscle.  No obvious Baker's cyst.  Toes warm and well-perfused.  IMAGING: I personally ordered and reviewed the following images  X-rays of the left knee were obtained in clinic today.  Varus alignment overall.  Complete loss of joint space within the medial compartment.  Small osteophytes are appreciated.  No bony lesions.  Previous proximal fibular fracture is healed.  Impression: Advanced left knee arthritis, specifically within the medial compartment.   New Medications:  No orders of the defined types were placed in this encounter.     Mordecai Rasmussen, MD  06/07/2022 9:51 AM

## 2022-08-17 ENCOUNTER — Ambulatory Visit
Admission: EM | Admit: 2022-08-17 | Discharge: 2022-08-17 | Disposition: A | Discharge status: Home / Self Care | Payer: 59 | Attending: Nurse Practitioner | Admitting: Nurse Practitioner

## 2022-08-17 DIAGNOSIS — R42 Dizziness and giddiness: Secondary | ICD-10-CM | POA: Diagnosis present

## 2022-08-17 DIAGNOSIS — H6593 Unspecified nonsuppurative otitis media, bilateral: Secondary | ICD-10-CM

## 2022-08-17 DIAGNOSIS — Z1152 Encounter for screening for COVID-19: Secondary | ICD-10-CM

## 2022-08-17 DIAGNOSIS — J069 Acute upper respiratory infection, unspecified: Secondary | ICD-10-CM

## 2022-08-17 MED ORDER — FLUTICASONE PROPIONATE 50 MCG/ACT NA SUSP: 2.0000 | Freq: Every day | NASAL | 0 refills | Status: AC

## 2022-08-17 MED ORDER — MECLIZINE HCL 12.5 MG PO TABS: 12.5000 mg | ORAL_TABLET | Freq: Three times a day (TID) | ORAL | 0 refills | Status: AC | PRN

## 2022-08-17 MED ORDER — PROMETHAZINE-DM 6.25-15 MG/5ML PO SYRP: 5.0000 mL | ORAL_SOLUTION | Freq: Four times a day (QID) | ORAL | 0 refills | Status: AC | PRN

## 2022-08-17 MED ORDER — PREDNISONE 20 MG PO TABS
40.0000 mg | ORAL_TABLET | Freq: Every day | ORAL | 0 refills | Status: AC
Start: 2022-08-17 — End: 2022-08-22

## 2022-08-17 NOTE — ED Provider Notes (Addendum)
[facility]    CSN: [id number] Arrival date & time: 08/17/22  1537      History   Chief Complaint No chief complaint on file.   HPI Jenna Love is a 62 y.o. female.   The history is provided by the patient.   Patient presents for complaints of chills, bilateral ear pain, productive cough, and dizziness.  Symptoms started over the past 2 days.  Patient denies fever, but states that she has had chills.  She also states that she has had some shortness of breath with the cough.  She denies headache, sore throat, ear drainage, wheezing, difficulty breathing, chest pain, abdominal pain, nausea, vomiting, or diarrhea.  Patient reports that she took Tylenol for her symptoms.  She denies any obvious known sick contacts.  Past Medical History:  Diagnosis Date   Anxiety    Back pain    Bilateral swelling of feet    Chronic cough 05/20/2013   Depression    IBS (irritable bowel syndrome)    Joint pain    Nodule of right lung    Panic attacks    Pneumonia    Sleep apnea    SOB (shortness of breath)    Thyroid nodule 2013   Benign biopsy- ENT Dr. Pollyann Kennedy   Vitamin D deficiency     Patient Active Problem List   Diagnosis Date Noted   Pain of upper abdomen 02/14/2021   Change in bowel habits 02/14/2021   Non-recurrent acute suppurative otitis media of both ears without spontaneous rupture of tympanic membranes 05/24/2020   Chronic fatigue 05/24/2020   Constipation 09/18/2019   Pelvic pain 06/10/2019   Encounter for gynecological examination with Papanicolaou smear of cervix 10/22/2018   Fatty liver disease, nonalcoholic 04/01/2018   Pulmonary nodule, left 04/12/2016   History of colonic polyps    Encounter for screening colonoscopy 10/13/2014   High risk medication use 10/13/2014   Peripheral edema 05/06/2014   Thyroid nodule 04/15/2013   Hypertriglyceridemia 02/21/2013   MDD (major depressive disorder) (HCC) 06/29/2011   Menorrhagia 06/15/2011   Insomnia  04/14/2011   Class 3 obesity (HCC) 04/14/2011   GAD (generalized anxiety disorder) 10/23/2009    Past Surgical History:  Procedure Laterality Date   BIOPSY  03/24/2021   Procedure: BIOPSY;  Surgeon: Corbin Ade, MD;  Location: [facility];  Service: Endoscopy;;   CESAREAN SECTION  1992   COLONOSCOPY WITH PROPOFOL N/A 11/05/2014   One 5 mm sessile polyp in base of cecum. otherwise normal. fecal material on path. Surveillance in 5 years.    COLONOSCOPY WITH PROPOFOL N/A 03/24/2021   Procedure: COLONOSCOPY WITH PROPOFOL;  Surgeon: Corbin Ade, MD;  Location: [facility];  Service: Endoscopy;  Laterality: N/A;  12:15pm   ESOPHAGOGASTRODUODENOSCOPY (EGD) WITH PROPOFOL N/A 03/24/2021   Procedure: ESOPHAGOGASTRODUODENOSCOPY (EGD) WITH PROPOFOL;  Surgeon: Corbin Ade, MD;  Location: [facility];  Service: Endoscopy;  Laterality: N/A;   HYSTEROSCOPY WITH D & C N/A 10/03/2013   Procedure: DILATATION AND CURETTAGE /HYSTEROSCOPY;  Surgeon: Tilda Burrow, MD;  Location: [facility];  Service: Gynecology;  Laterality: N/A;   HYSTEROSCOPY WITH D & C N/A 11/05/2019   Procedure: DILATATION AND CURETTAGE HYSTEROSCOPY;  Surgeon: Tilda Burrow, MD;  Location: [facility];  Service: Gynecology;  Laterality: N/A;   POLYPECTOMY N/A 10/03/2013   Procedure: REMOVAL OF ENDOMETRIAL POLYP;  Surgeon: Tilda Burrow, MD;  Location: [facility];  Service: Gynecology;  Laterality: N/A;   POLYPECTOMY  N/A 11/05/2014   Procedure: POLYPECTOMY;  Surgeon: Corbin Ade, MD;  Location: [facility];  Service: Endoscopy;  Laterality: N/A;   uterus polyp     VULVA /PERINEUM BIOPSY Right 11/05/2019   Procedure: VULVAR BIOPSY;  Surgeon: Tilda Burrow, MD;  Location: [facility];  Service: Gynecology;  Laterality: Right;    OB History     Gravida  2   Para  2   Term  2   Preterm      AB      Living  2      SAB      IAB      Ectopic      Multiple      Live Births  2            Home Medications    Prior to  Admission medications   Medication Sig Start Date End Date Taking? Authorizing Provider  fluticasone (FLONASE) 50 MCG/ACT nasal spray Place 2 sprays into both nostrils daily. 08/17/22  Yes Won Kreuzer-Warren, Sadie Haber, NP  meclizine (ANTIVERT) 12.5 MG tablet Take 1 tablet (12.5 mg total) by mouth 3 (three) times daily as needed for dizziness. 08/17/22  Yes Alexsander Cavins-Warren, Sadie Haber, NP  predniSONE (DELTASONE) 20 MG tablet Take 2 tablets (40 mg total) by mouth daily with breakfast for 5 days. 08/17/22 08/22/22 Yes Balbina Depace-Warren, Sadie Haber, NP  promethazine-dextromethorphan (PROMETHAZINE-DM) 6.25-15 MG/5ML syrup Take 5 mLs by mouth 4 (four) times daily as needed for cough. 08/17/22  Yes Mikaila Grunert-Warren, Sadie Haber, NP  Cholecalciferol (VITAMIN D) 2000 units CAPS Take 2,000 Units by mouth daily.    [provider]  clonazePAM (KLONOPIN) 1 MG tablet TAKE 1 TABLET BY MOUTH 3 TIMES A DAY AS NEEDED FOR ANXIETY. 08/26/20   Anabel Halon, MD  sertraline (ZOLOFT) 100 MG tablet Take 1.5 tablets (150 mg total) by mouth daily. 05/13/20   Salley Scarlet, MD  vitamin E 1000 UNIT capsule Take 1,000 Units by mouth daily.    [provider]  zolpidem (AMBIEN) 10 MG tablet Take 1 tablet (10 mg total) by mouth at bedtime as needed. for insomnia Patient taking differently: Take 10 mg by mouth at bedtime. for insomnia 10/25/20   Anabel Halon, MD    Family History Family History  Problem Relation Age of Onset   Diabetes Mother    Alzheimer's disease Mother    Depression Mother    Anxiety disorder Mother    Obesity Mother    Cancer Father        lung    Alcoholism Father    Cancer Paternal Grandfather    Hodgkin's lymphoma Son    Colon cancer Neg Hx    Colon polyps Neg Hx     Social History Social History   Tobacco Use   Smoking status: Never   Smokeless tobacco: Never  Vaping Use   Vaping Use: Never used  Substance Use Topics   Alcohol use: No   Drug use: No     Allergies   Patient has  no known allergies.   Review of Systems Review of Systems Per HPI  Physical Exam Triage Vital Signs ED Triage Vitals  Enc Vitals Group     BP 08/17/22 1541 (!) 140/83     Pulse Rate 08/17/22 1541 95     Resp 08/17/22 1541 (!) 26     Temp 08/17/22 1541 98.6 F (37 C)     Temp Source 08/17/22 1541 Oral  SpO2 08/17/22 1541 99 %     Weight --      Height --      Head Circumference --      Peak Flow --      Pain Score 08/17/22 1547 5     Pain Loc --      Pain Edu? --      Excl. in GC? --    Orthostatic VS for the past 24 hrs:  BP- Lying Pulse- Lying BP- Sitting Pulse- Sitting BP- Standing at 0 minutes Pulse- Standing at 0 minutes  08/17/22 1602 138/85 85 132/81 89 134/78 95    Updated Vital Signs BP (!) 140/83 (BP Location: Right Arm)   Pulse 95   Temp 98.6 F (37 C) (Oral)   Resp (!) 26   LMP 05/25/2014   SpO2 99%   Visual Acuity Right Eye Distance:   Left Eye Distance:   Bilateral Distance:    Right Eye Near:   Left Eye Near:    Bilateral Near:     Physical Exam Vitals and nursing note reviewed.  Constitutional:      General: She is not in acute distress.    Appearance: Normal appearance.  HENT:     Head: Normocephalic.     Right Ear: Ear canal and external ear normal.     Left Ear: Ear canal and external ear normal.     Nose: Nose normal.     Mouth/Throat:     Mouth: Mucous membranes are moist.     Pharynx: Posterior oropharyngeal erythema present.     Comments: Cobblestoning present on posterior oropharynx Eyes:     Extraocular Movements: Extraocular movements intact.     Conjunctiva/sclera: Conjunctivae normal.     Pupils: Pupils are equal, round, and reactive to light.  Cardiovascular:     Rate and Rhythm: Normal rate and regular rhythm.     Pulses: Normal pulses.     Heart sounds: Normal heart sounds.  Pulmonary:     Effort: Pulmonary effort is normal. No respiratory distress.     Breath sounds: Normal breath sounds. No stridor. No  wheezing, rhonchi or rales.  Abdominal:     General: Bowel sounds are normal.     Palpations: Abdomen is soft.     Tenderness: There is no abdominal tenderness.  Musculoskeletal:     Cervical back: Normal range of motion.  Skin:    General: Skin is warm and dry.  Neurological:     General: No focal deficit present.     Mental Status: She is alert and oriented to person, place, and time.  Psychiatric:        Mood and Affect: Mood normal.        Behavior: Behavior normal.      UC Treatments / Results  Labs (all labs ordered are listed, but only abnormal results are displayed) Labs Reviewed  SARS CORONAVIRUS 2 (TAT 6-24 HRS)    EKG   Radiology No results found.  Procedures Procedures (including critical care time)  Medications Ordered in UC Medications - No data to display  Initial Impression / Assessment and Plan / UC Course  I have reviewed the triage vital signs and the nursing notes.  Pertinent labs & imaging results that were available during my care of the patient were reviewed by me and considered in my medical decision making (see chart for details).  Patient presents with a 2-day history of chills, dizziness, cough, and bilateral ear pain.  On exam, patient with middle ear effusions bilaterally.  Lung sounds are clear throughout.  Orthostatics were performed, patient was negative for orthostatic hypotension.  COVID test is pending, patient is a candidate to receive Paxlovid if her COVID test is positive.  Dizziness may be caused by the middle ear effusions, also cannot rule out vertigo.  Will treat patient for cough with Promethazine DM.  For her middle ear effusions and due to the severity of her dizziness, will treat with prednisone 40 mg for the next 5 days.  Patient was also prescribed fluticasone 50 mcg nasal spray for the middle ear effusions.  To help with the patient's dizziness, she was prescribed meclizine 12.5 mg to take as needed.  Supportive care  recommendations were provided and discussed with the patient to include increasing fluids, allowing for plenty of rest, use of over-the-counter analgesics for fever, pain or discomfort, and use of a humidifier in her bedroom at nighttime during sleep to help with the cough.  Patient was given strict follow-up precautions, along with indications of ongoing emergency department would be necessary.  Patient is in agreement with this plan of care and verbalizes understanding.  All questions were answered.  Patient stable for discharge.  Final Clinical Impressions(s) / UC Diagnoses   Final diagnoses:  Viral upper respiratory tract infection with cough  Middle ear effusion, bilateral  Dizziness  Encounter for screening for COVID-19     Discharge Instructions      Take medication as prescribed. May take over-the-counter Tylenol or ibuprofen as needed for pain, fever, general discomfort. Increase fluids and allow for plenty of rest.  Make sure you are drinking at least 8-10 8 ounce glasses of water daily while symptoms persist. Recommend using a humidifier in your bedroom at nighttime during sleep and sleeping elevated on pillows while cough symptoms persist. If you continue to experience dizziness, lightheadedness, or experience changes in your mental status, or vision, please go to the emergency department immediately for further evaluation. If symptoms fail to improve, please follow-up in this clinic or with your primary care physician for further evaluation. Follow-up as needed.     ED Prescriptions     Medication Sig Dispense Auth. Provider   predniSONE (DELTASONE) 20 MG tablet Take 2 tablets (40 mg total) by mouth daily with breakfast for 5 days. 10 tablet Sedonia Kitner-Warren, Sadie Haber, NP   promethazine-dextromethorphan (PROMETHAZINE-DM) 6.25-15 MG/5ML syrup Take 5 mLs by mouth 4 (four) times daily as needed for cough. 118 mL Tomara Youngberg-Warren, Sadie Haber, NP   meclizine (ANTIVERT) 12.5 MG tablet  Take 1 tablet (12.5 mg total) by mouth 3 (three) times daily as needed for dizziness. 20 tablet Angella Montas-Warren, Sadie Haber, NP   fluticasone (FLONASE) 50 MCG/ACT nasal spray Place 2 sprays into both nostrils daily. 16 g Camaya Gannett-Warren, Sadie Haber, NP      PDMP not reviewed this encounter.   Abran Cantor, NP 08/17/22 1616    Chelly Dombeck-Warren, Sadie Haber, NP 08/17/22 [phone]

## 2022-08-17 NOTE — ED Triage Notes (Signed)
Pt reports she has chills, bilateral ear pain, SOB coughing up brown mucus, and dizziness x 2 days. Dizziness has worsened since yesterday. Took tylenol

## 2022-08-17 NOTE — Discharge Instructions (Signed)
Take medication as prescribed. May take over-the-counter Tylenol or ibuprofen as needed for pain, fever, general discomfort. Increase fluids and allow for plenty of rest.  Make sure you are drinking at least 8-10 8 ounce glasses of water daily while symptoms persist. Recommend using a humidifier in your bedroom at nighttime during sleep and sleeping elevated on pillows while cough symptoms persist. If you continue to experience dizziness, lightheadedness, or experience changes in your mental status, or vision, please go to the emergency department immediately for further evaluation. If symptoms fail to improve, please follow-up in this clinic or with your primary care physician for further evaluation. Follow-up as needed.

## 2022-08-18 ENCOUNTER — Telehealth (HOSPITAL_COMMUNITY): Payer: Self-pay | Admitting: Emergency Medicine

## 2022-08-18 LAB — SARS CORONAVIRUS 2 (TAT 6-24 HRS): SARS Coronavirus 2: NEGATIVE

## 2022-08-18 NOTE — Telephone Encounter (Signed)
Patient left very broken up voicemail, unsure of what she was calling for Attempted return call, left voicemail

## 2023-03-28 ENCOUNTER — Other Ambulatory Visit (HOSPITAL_COMMUNITY): Payer: Self-pay | Admitting: Family Medicine

## 2023-03-28 DIAGNOSIS — Z1231 Encounter for screening mammogram for malignant neoplasm of breast: Secondary | ICD-10-CM

## 2023-03-29 ENCOUNTER — Ambulatory Visit (HOSPITAL_COMMUNITY): Payer: 59

## 2023-09-06 ENCOUNTER — Ambulatory Visit (HOSPITAL_COMMUNITY)
Admission: RE | Admit: 2023-09-06 | Discharge: 2023-09-06 | Discharge status: Home / Self Care | Source: Ambulatory Visit | Attending: Family Medicine | Admitting: Family Medicine

## 2023-09-06 DIAGNOSIS — Z1231 Encounter for screening mammogram for malignant neoplasm of breast: Secondary | ICD-10-CM | POA: Diagnosis present
